# Patient Record
Sex: Female | Born: 1940
Health system: Southern US, Community
[De-identification: ages and names within clinical notes are randomized; demographics above are authoritative.]

## PROBLEM LIST (undated history)

## (undated) DIAGNOSIS — M1990 Unspecified osteoarthritis, unspecified site: Secondary | ICD-10-CM

## (undated) DIAGNOSIS — E119 Type 2 diabetes mellitus without complications: Secondary | ICD-10-CM

## (undated) DIAGNOSIS — K859 Acute pancreatitis without necrosis or infection, unspecified: Secondary | ICD-10-CM

## (undated) DIAGNOSIS — E785 Hyperlipidemia, unspecified: Secondary | ICD-10-CM

## (undated) DIAGNOSIS — F0390 Unspecified dementia without behavioral disturbance: Secondary | ICD-10-CM

## (undated) DIAGNOSIS — H5440 Blindness, one eye, unspecified eye: Secondary | ICD-10-CM

## (undated) DIAGNOSIS — D649 Anemia, unspecified: Secondary | ICD-10-CM

## (undated) DIAGNOSIS — I10 Essential (primary) hypertension: Secondary | ICD-10-CM

## (undated) DIAGNOSIS — K219 Gastro-esophageal reflux disease without esophagitis: Secondary | ICD-10-CM

## (undated) DIAGNOSIS — R6251 Failure to thrive (child): Secondary | ICD-10-CM

## (undated) HISTORY — DX: Failure to thrive (child): R62.51

## (undated) HISTORY — DX: Unspecified osteoarthritis, unspecified site: M19.90

## (undated) HISTORY — DX: Gastro-esophageal reflux disease without esophagitis: K21.9

## (undated) HISTORY — DX: Type 2 diabetes mellitus without complications: E11.9

## (undated) HISTORY — DX: Anemia, unspecified: D64.9

## (undated) HISTORY — DX: Acute pancreatitis without necrosis or infection, unspecified: K85.90

## (undated) HISTORY — PX: INDUCED ABORTION: SHX677

## (undated) HISTORY — DX: Essential (primary) hypertension: I10

## (undated) HISTORY — DX: Blindness, one eye, unspecified eye: H54.40

## (undated) HISTORY — PX: HERNIA REPAIR: SHX51

## (undated) HISTORY — DX: Unspecified dementia, unspecified severity, without behavioral disturbance, psychotic disturbance, mood disturbance, and anxiety: F03.90

## (undated) HISTORY — DX: Hyperlipidemia, unspecified: E78.5

---

## 2005-01-21 ENCOUNTER — Ambulatory Visit: Payer: Self-pay | Admitting: Family Medicine

## 2005-04-05 ENCOUNTER — Ambulatory Visit: Payer: Self-pay | Admitting: Family Medicine

## 2005-05-17 ENCOUNTER — Ambulatory Visit: Payer: Self-pay | Admitting: Family Medicine

## 2005-08-01 ENCOUNTER — Ambulatory Visit: Payer: Self-pay | Admitting: Family Medicine

## 2005-11-27 ENCOUNTER — Ambulatory Visit: Payer: Self-pay | Admitting: Family Medicine

## 2005-11-29 ENCOUNTER — Ambulatory Visit: Payer: Self-pay | Admitting: Family Medicine

## 2005-12-06 ENCOUNTER — Ambulatory Visit: Payer: Self-pay | Admitting: Family Medicine

## 2005-12-13 ENCOUNTER — Ambulatory Visit: Payer: Self-pay | Admitting: Family Medicine

## 2006-01-17 ENCOUNTER — Ambulatory Visit: Payer: Self-pay | Admitting: Family Medicine

## 2006-09-05 ENCOUNTER — Ambulatory Visit: Payer: Self-pay | Admitting: Family Medicine

## 2006-09-08 ENCOUNTER — Ambulatory Visit: Payer: Self-pay | Admitting: Family Medicine

## 2007-01-15 ENCOUNTER — Ambulatory Visit: Payer: Self-pay | Admitting: Family Medicine

## 2007-01-30 ENCOUNTER — Ambulatory Visit: Payer: Self-pay | Admitting: Vascular Surgery

## 2007-05-01 ENCOUNTER — Ambulatory Visit: Payer: Self-pay | Admitting: Vascular Surgery

## 2010-12-25 NOTE — Assessment & Plan Note (Signed)
OFFICE VISIT   Claudia [name], Claudia [name]  DOB:  23-Sep-1940                                       05/01/2007  ZOXWR#:[id number]   Patient presents today for continued followup of her left leg venous  varicosities.  She has worn compression garments for three months and  reports that she still has pain overlying the varicosities.  She  underwent a venous duplex in our office today, and I have reviewed this  with her as well.  She does have reflux at the left saphenofemoral  junction over a short segment but then begins having normal caliber of  her saphenous vein through her thigh.  She does have a large group of  superficial tributary varicosities over her left posterior thigh  extending into her popliteal space and laterally on her calves.  These  do not appear associated with her saphenous vein.  I discussed options  with patient through her granddaughter, who is translating.  I do not  see any indication for laser ablation of her saphenous vein.  I would  recommend stab phlebectomy of these multiple painful tributary  varicosities.  She understands this it not life or limb threatening, but  she is having pronounced pain with prolonged standing with these and has  failed conservative treatment.  We will schedule this at her  convenience.   Larina Earthly, M.D.  Electronically Signed   TFE/MEDQ  D:  05/01/2007  T:  05/04/2007  Job:  [id number]

## 2010-12-25 NOTE — Procedures (Signed)
LOWER EXTREMITY VENOUS REFLUX EXAM   INDICATION:  Left leg varicose veins and pain.   EXAM:  Using color-flow imaging and pulse Doppler spectral analysis, the  left common femoral, superficial femoral, popliteal, posterior tibial,  greater and lesser saphenous veins are evaluated.  There is no evidence  suggesting deep venous insufficiency in the left lower extremity.   The left saphenofemoral junction is not competent.  The left GSV is  competent with the caliber as described below.   The left proximal short saphenous vein demonstrates competency.   GSV Diameter (used if found to be incompetent only)                                            Right    Left  Proximal Greater Saphenous Vein           cm       0.39 cm  Proximal-to-mid-thigh                     cm       0.39 cm  Mid thigh                                 cm       0.26 cm  Mid-distal thigh                          cm       0.26 cm  Distal thigh                              cm       0.25 cm  Knee                                      cm       0.24 cm   IMPRESSION:  1. No evidence of reflux noted in the left greater saphenous vein.  2. The left greater saphenous vein is not aneurysmal.  3. The left greater saphenous vein is not tortuous.  4. The deep venous system is competent.  5. The left lesser saphenous vein is competent.  6. No evidence of deep venous thrombosis noted in the left leg.  7. Cluster of varicosities noted in the groin region.   ___________________________________________  Larina Earthly, M.D.   MG/MEDQ  D:  05/01/2007  T:  05/02/2007  Job:  [id number]

## 2010-12-25 NOTE — Consult Note (Signed)
VASCULAR SURGERY CONSULTATION   Claudia [name], Claudia [name]  DOB:  [birth year]                                       01/30/2007  ZOXWR#:[id number]   Claudia [name] presents today for evaluation of left leg venous disease.  She is a pleasant 70 year old Hispanic female with progressive pain and  swelling over her left posterior calf; and the posterior thigh.  She  reports an aching sensation throughout this area; and specifically over  the veins themselves with prolonged standing.  She reports that this has  been present for many years, but has been progressive.  She does have  evidence of significant venous hypertension over these areas.  She does  not have any history of arterial insufficiency; and does not had any  history of deep venous thrombosis or bleeding.  She does have swelling  at the end of the day over her left versus her right leg.   PAST HISTORY:  Significant for elevated cholesterol, elevated blood  pressure.  She does not have a history of cardiac disease.  She is not a  diabetic.   SOCIAL HISTORY:  Significant for being widowed with four children.  She  does not smoke or drink alcohol.  She does have some arthritic joint  pain in her left knee.   PHYSICAL EXAMINATION:  A well-developed, well-nourished female in no  acute distress.  She speaks very little [place], but fortunately her  daughter is fluent in [place] and [place] and is here to translate.  She  has some reticular varicosities at the medial thigh near her groin.  She  does have the marked large varicose veins in her lateral posterior thigh  extending down onto her calf.  She does have tenderness over these  areas.  She has 2+ dorsalis pedis pulses bilaterally.  She underwent a  hand-held duplex of the knee showing reflux in these veins over her  lateral calf.  I discussed the options with Claudia [name].  I have  recommended that we proceed with 20-54mm  compression garment on her  left leg to  determine if this will adequately treat her pain.  If this  fails, she, in all likelihood, would be a great candidate for laser  ablation of her small saphenous vein, and stab phlebectomy.  We will see  her in three months' to determine the success of her graduated  compression.  Also she will continue elevation and nonnarcotic pain meds  as well.  We will have a formal duplex at that time as well.   Larina Earthly, M.D.  Electronically Signed  TFE/MEDQ  D:  01/30/2007  T:  02/02/2007  Job:  [id number]   cc:   Delaney Meigs, M.D.

## 2011-08-14 DIAGNOSIS — E785 Hyperlipidemia, unspecified: Secondary | ICD-10-CM | POA: Diagnosis not present

## 2011-08-14 DIAGNOSIS — E119 Type 2 diabetes mellitus without complications: Secondary | ICD-10-CM | POA: Diagnosis not present

## 2011-08-14 DIAGNOSIS — M129 Arthropathy, unspecified: Secondary | ICD-10-CM | POA: Diagnosis not present

## 2011-08-14 DIAGNOSIS — E559 Vitamin D deficiency, unspecified: Secondary | ICD-10-CM | POA: Diagnosis not present

## 2011-08-14 DIAGNOSIS — I10 Essential (primary) hypertension: Secondary | ICD-10-CM | POA: Diagnosis not present

## 2011-11-13 DIAGNOSIS — E119 Type 2 diabetes mellitus without complications: Secondary | ICD-10-CM | POA: Diagnosis not present

## 2011-11-13 DIAGNOSIS — D649 Anemia, unspecified: Secondary | ICD-10-CM | POA: Diagnosis not present

## 2011-11-13 DIAGNOSIS — R5383 Other fatigue: Secondary | ICD-10-CM | POA: Diagnosis not present

## 2011-11-13 DIAGNOSIS — D539 Nutritional anemia, unspecified: Secondary | ICD-10-CM | POA: Diagnosis not present

## 2012-02-14 DIAGNOSIS — I10 Essential (primary) hypertension: Secondary | ICD-10-CM | POA: Diagnosis not present

## 2012-02-14 DIAGNOSIS — E559 Vitamin D deficiency, unspecified: Secondary | ICD-10-CM | POA: Diagnosis not present

## 2012-02-14 DIAGNOSIS — E785 Hyperlipidemia, unspecified: Secondary | ICD-10-CM | POA: Diagnosis not present

## 2012-02-14 DIAGNOSIS — D509 Iron deficiency anemia, unspecified: Secondary | ICD-10-CM | POA: Diagnosis not present

## 2012-02-14 DIAGNOSIS — E119 Type 2 diabetes mellitus without complications: Secondary | ICD-10-CM | POA: Diagnosis not present

## 2012-02-19 DIAGNOSIS — D649 Anemia, unspecified: Secondary | ICD-10-CM | POA: Diagnosis not present

## 2012-04-08 DIAGNOSIS — K5900 Constipation, unspecified: Secondary | ICD-10-CM | POA: Diagnosis not present

## 2012-04-08 DIAGNOSIS — D500 Iron deficiency anemia secondary to blood loss (chronic): Secondary | ICD-10-CM | POA: Diagnosis not present

## 2012-04-15 DIAGNOSIS — K2940 Chronic atrophic gastritis without bleeding: Secondary | ICD-10-CM | POA: Diagnosis not present

## 2012-04-15 DIAGNOSIS — D509 Iron deficiency anemia, unspecified: Secondary | ICD-10-CM | POA: Diagnosis not present

## 2012-04-15 DIAGNOSIS — K259 Gastric ulcer, unspecified as acute or chronic, without hemorrhage or perforation: Secondary | ICD-10-CM | POA: Diagnosis not present

## 2012-04-15 DIAGNOSIS — D500 Iron deficiency anemia secondary to blood loss (chronic): Secondary | ICD-10-CM | POA: Diagnosis not present

## 2012-04-15 DIAGNOSIS — K649 Unspecified hemorrhoids: Secondary | ICD-10-CM | POA: Diagnosis not present

## 2012-04-15 DIAGNOSIS — K449 Diaphragmatic hernia without obstruction or gangrene: Secondary | ICD-10-CM | POA: Diagnosis not present

## 2012-04-15 DIAGNOSIS — A048 Other specified bacterial intestinal infections: Secondary | ICD-10-CM | POA: Diagnosis not present

## 2012-05-18 DIAGNOSIS — D500 Iron deficiency anemia secondary to blood loss (chronic): Secondary | ICD-10-CM | POA: Diagnosis not present

## 2012-05-18 DIAGNOSIS — K259 Gastric ulcer, unspecified as acute or chronic, without hemorrhage or perforation: Secondary | ICD-10-CM | POA: Diagnosis not present

## 2012-10-08 DIAGNOSIS — I10 Essential (primary) hypertension: Secondary | ICD-10-CM | POA: Diagnosis not present

## 2012-10-08 DIAGNOSIS — E119 Type 2 diabetes mellitus without complications: Secondary | ICD-10-CM | POA: Diagnosis not present

## 2012-10-08 DIAGNOSIS — E785 Hyperlipidemia, unspecified: Secondary | ICD-10-CM | POA: Diagnosis not present

## 2012-10-27 ENCOUNTER — Telehealth: Payer: Self-pay | Admitting: Family Medicine

## 2012-10-27 MED ORDER — OLMESARTAN MEDOXOMIL 40 MG PO TABS: 40.0000 mg | ORAL_TABLET | Freq: Every day | ORAL | Status: DC

## 2012-10-27 MED ORDER — METFORMIN HCL 1000 MG PO TABS: 1000.0000 mg | ORAL_TABLET | Freq: Two times a day (BID) | ORAL | Status: DC

## 2012-10-27 NOTE — Telephone Encounter (Signed)
Refilled meds

## 2012-11-04 ENCOUNTER — Telehealth: Payer: Self-pay | Admitting: Physician Assistant

## 2012-11-04 DIAGNOSIS — E119 Type 2 diabetes mellitus without complications: Secondary | ICD-10-CM

## 2012-11-04 DIAGNOSIS — M1990 Unspecified osteoarthritis, unspecified site: Secondary | ICD-10-CM

## 2012-11-04 DIAGNOSIS — I10 Essential (primary) hypertension: Secondary | ICD-10-CM

## 2012-11-05 MED ORDER — METFORMIN HCL 1000 MG PO TABS: 1000.0000 mg | ORAL_TABLET | Freq: Two times a day (BID) | ORAL | Status: DC

## 2012-11-05 MED ORDER — TRAMADOL HCL 50 MG PO TABS: 50.0000 mg | ORAL_TABLET | Freq: Four times a day (QID) | ORAL | Status: DC | PRN

## 2012-11-05 MED ORDER — OLMESARTAN MEDOXOMIL 40 MG PO TABS: 40.0000 mg | ORAL_TABLET | Freq: Every day | ORAL | Status: DC

## 2012-11-05 MED ORDER — NAPROXEN 500 MG PO TBEC: 500.0000 mg | DELAYED_RELEASE_TABLET | Freq: Two times a day (BID) | ORAL | Status: DC

## 2012-11-05 NOTE — Telephone Encounter (Signed)
Medication refilled per protocol. 

## 2012-12-12 ENCOUNTER — Other Ambulatory Visit: Payer: Self-pay | Admitting: Family Medicine

## 2012-12-28 ENCOUNTER — Telehealth: Payer: Self-pay | Admitting: Family Medicine

## 2012-12-28 ENCOUNTER — Telehealth: Payer: Self-pay | Admitting: *Deleted

## 2012-12-28 DIAGNOSIS — I10 Essential (primary) hypertension: Secondary | ICD-10-CM

## 2012-12-28 NOTE — Telephone Encounter (Signed)
This was refilled 3/25 + 5  Pharmacy called.  No need for another refill at this time

## 2012-12-29 MED ORDER — OLMESARTAN MEDOXOMIL 40 MG PO TABS: 40.0000 mg | ORAL_TABLET | Freq: Every day | ORAL | Status: DC

## 2012-12-29 NOTE — Telephone Encounter (Signed)
Medication refilled per protocol. 

## 2012-12-30 ENCOUNTER — Telehealth: Payer: Self-pay | Admitting: Family Medicine

## 2012-12-30 NOTE — Telephone Encounter (Signed)
RX was filled on 12/29/12

## 2013-01-14 ENCOUNTER — Other Ambulatory Visit: Payer: Self-pay | Admitting: Family Medicine

## 2013-01-14 NOTE — Telephone Encounter (Signed)
Medication refilled per protocol. 

## 2013-01-30 ENCOUNTER — Other Ambulatory Visit: Payer: Self-pay | Admitting: Family Medicine

## 2013-02-01 NOTE — Telephone Encounter (Signed)
?   OK to Refill  

## 2013-02-01 NOTE — Telephone Encounter (Signed)
Ok to refill but needs ov  

## 2013-03-10 ENCOUNTER — Other Ambulatory Visit: Payer: Self-pay | Admitting: Physician Assistant

## 2013-03-10 ENCOUNTER — Encounter: Payer: Self-pay | Admitting: Family Medicine

## 2013-03-10 ENCOUNTER — Other Ambulatory Visit: Payer: Self-pay | Admitting: Family Medicine

## 2013-03-10 NOTE — Telephone Encounter (Signed)
?   OK to Refill  

## 2013-03-10 NOTE — Telephone Encounter (Signed)
Medication refill for one time only.  Patient needs to be seen.  Letter sent for patient to call and schedule 

## 2013-03-11 NOTE — Telephone Encounter (Signed)
ntbs before next refill.  OK to fill this time.

## 2013-03-11 NOTE — Telephone Encounter (Signed)
Med refilled tried calling pt and number has been disconnected

## 2013-03-14 ENCOUNTER — Other Ambulatory Visit: Payer: Self-pay | Admitting: Physician Assistant

## 2013-03-15 ENCOUNTER — Encounter: Payer: Self-pay | Admitting: Family Medicine

## 2013-03-15 NOTE — Telephone Encounter (Signed)
Pt NTBS  letter sent

## 2013-03-16 ENCOUNTER — Other Ambulatory Visit: Payer: Self-pay | Admitting: Physician Assistant

## 2013-03-16 NOTE — Telephone Encounter (Signed)
Medication refilled per protocol.Patient needs to be seen before any further refills 

## 2013-03-27 ENCOUNTER — Other Ambulatory Visit: Payer: Self-pay | Admitting: Family Medicine

## 2013-03-29 ENCOUNTER — Other Ambulatory Visit: Payer: Self-pay | Admitting: Family Medicine

## 2013-03-29 ENCOUNTER — Ambulatory Visit: Payer: Self-pay | Admitting: Family Medicine

## 2013-03-29 ENCOUNTER — Other Ambulatory Visit: Payer: Self-pay | Admitting: Physician Assistant

## 2013-03-29 NOTE — Telephone Encounter (Signed)
Medication refilled per protocol. 

## 2013-03-30 ENCOUNTER — Other Ambulatory Visit: Payer: Self-pay | Admitting: Physician Assistant

## 2013-03-30 NOTE — Telephone Encounter (Signed)
Refill denied.  Pt has been sent two letters to make appt.

## 2013-03-31 ENCOUNTER — Telehealth: Payer: Self-pay | Admitting: Family Medicine

## 2013-03-31 DIAGNOSIS — I10 Essential (primary) hypertension: Secondary | ICD-10-CM

## 2013-03-31 MED ORDER — OLMESARTAN MEDOXOMIL 40 MG PO TABS: 40.0000 mg | ORAL_TABLET | Freq: Every day | ORAL | Status: DC

## 2013-03-31 NOTE — Telephone Encounter (Signed)
Rx Refilled  

## 2013-03-31 NOTE — Telephone Encounter (Signed)
Benicar 40 mg tab 1 QD #30

## 2013-04-02 ENCOUNTER — Ambulatory Visit (INDEPENDENT_AMBULATORY_CARE_PROVIDER_SITE_OTHER): Payer: Medicare Other | Admitting: Family Medicine

## 2013-04-02 VITALS — BP 150/98 | HR 86 | Temp 98.7°F | Resp 16 | Wt 179.0 lb

## 2013-04-02 DIAGNOSIS — I10 Essential (primary) hypertension: Secondary | ICD-10-CM | POA: Diagnosis not present

## 2013-04-02 DIAGNOSIS — M5441 Lumbago with sciatica, right side: Secondary | ICD-10-CM

## 2013-04-02 DIAGNOSIS — M5430 Sciatica, unspecified side: Secondary | ICD-10-CM

## 2013-04-02 DIAGNOSIS — E785 Hyperlipidemia, unspecified: Secondary | ICD-10-CM

## 2013-04-02 DIAGNOSIS — E119 Type 2 diabetes mellitus without complications: Secondary | ICD-10-CM | POA: Diagnosis not present

## 2013-04-02 MED ORDER — METFORMIN HCL 500 MG PO TABS: 500.0000 mg | ORAL_TABLET | Freq: Two times a day (BID) | ORAL | Status: DC

## 2013-04-02 MED ORDER — HYDROCHLOROTHIAZIDE 25 MG PO TABS: 25.0000 mg | ORAL_TABLET | Freq: Every day | ORAL | Status: DC

## 2013-04-05 ENCOUNTER — Encounter: Payer: Self-pay | Admitting: Family Medicine

## 2013-04-05 ENCOUNTER — Telehealth: Payer: Self-pay | Admitting: Family Medicine

## 2013-04-05 ENCOUNTER — Other Ambulatory Visit: Payer: Medicare Other

## 2013-04-05 DIAGNOSIS — K219 Gastro-esophageal reflux disease without esophagitis: Secondary | ICD-10-CM | POA: Insufficient documentation

## 2013-04-05 DIAGNOSIS — I10 Essential (primary) hypertension: Secondary | ICD-10-CM | POA: Insufficient documentation

## 2013-04-05 DIAGNOSIS — E119 Type 2 diabetes mellitus without complications: Secondary | ICD-10-CM

## 2013-04-05 DIAGNOSIS — E785 Hyperlipidemia, unspecified: Secondary | ICD-10-CM

## 2013-04-05 LAB — MICROALBUMIN, URINE: Microalb, Ur: 1.6 mg/dL (ref 0.00–1.89)

## 2013-04-05 LAB — COMPLETE METABOLIC PANEL WITH GFR
BUN: 30 mg/dL — ABNORMAL HIGH (ref 6–23)
CO2: 24 mEq/L (ref 19–32)
Calcium: 9.5 mg/dL (ref 8.4–10.5)
Chloride: 109 mEq/L (ref 96–112)
Creat: 0.96 mg/dL (ref 0.50–1.10)
GFR, Est African American: 68 mL/min
GFR, Est Non African American: 59 mL/min — ABNORMAL LOW
Glucose, Bld: 90 mg/dL (ref 70–99)

## 2013-04-05 LAB — HEMOGLOBIN A1C
Hgb A1c MFr Bld: 5.9 % — ABNORMAL HIGH (ref <5.7)
Mean Plasma Glucose: 123 mg/dL — ABNORMAL HIGH (ref <117)

## 2013-04-05 LAB — LIPID PANEL: HDL: 46 mg/dL (ref >39)

## 2013-04-05 NOTE — Progress Notes (Signed)
Subjective:    Patient ID: Claudia [name], female    DOB: 1941/01/23, 72 y.o.   MRN: [id number]  HPI Patient is here today for followup of multiple medical problems.  Is diabetes mellitus type 2. She is going on metformin 1000 mg by mouth twice a day and Actos 15 mg by mouth daily. Her fasting blood sugars range 70-90. She has occasional episodes of hypoglycemia.  She denies any neuropathy in her feet. She denies any polyuria, polydipsia, or blurred vision.  She also has hypertension. She is currently on Benicar 40 mg by mouth daily. She denies any chest pain, shortness of breath, dyspnea on exertion. Her blood pressure today is 150/98. She also has hyperlipidemia. She is on Crestor 10 mg by mouth daily.  She denies any myalgia or right upper quadrant pain.  Chest complains of low back pain on the right flank since a fall several months ago. She complains of pain that radiates into her right hip down her right leg and into her right thigh. She denies any leg weakness. She denies any numbness in the leg. She has tried naproxen with minimal relief.  Past Medical History  Diagnosis Date  . Hypertension   . Hyperlipidemia   . Diabetes mellitus without complication   . Anemia   . GERD (gastroesophageal reflux disease)     chronic gastritis  . Arthritis    Current Outpatient Prescriptions on File Prior to Visit  Medication Sig Dispense Refill  . CRESTOR 10 MG tablet TAKE 1 TABLET BY MOUTH DAILY  30 tablet  0  . metFORMIN (GLUCOPHAGE) 1000 MG tablet TAKE 1 TABLET BY MOUTH TWICE A DAY  60 tablet  1  . NAPROXEN DR 500 MG EC tablet TAKE 1 TABLET BY MOUTH TWICE A DAY WITH A MEAL  60 tablet  0  . olmesartan (BENICAR) 40 MG tablet Take 1 tablet (40 mg total) by mouth daily.  30 tablet  2  . pioglitazone (ACTOS) 15 MG tablet TOME UNA TABLETA TODOS LOS DIAS  30 tablet  0  . traMADol (ULTRAM) 50 MG tablet TAKE 1 TABLET BY MOUTH EVERY 6 HOURS AS NEEDED FOR PAIN  120 tablet  0   No current  facility-administered medications on file prior to visit.   No Known Allergies History   Social History  . Marital Status: Single    Spouse Name: N/A    Number of Children: N/A  . Years of Education: N/A   Occupational History  . Not on file.   Social History Main Topics  . Smoking status: Never Smoker   . Smokeless tobacco: Never Used  . Alcohol Use: No  . Drug Use: No  . Sexual Activity: Not Currently   Other Topics Concern  . Not on file   Social History Narrative  . No narrative on file      Review of Systems  All other systems reviewed and are negative.       Objective:   Physical Exam  Vitals reviewed. Constitutional: She is oriented to person, place, and time. She appears well-developed and well-nourished.  HENT:  Head: Normocephalic and atraumatic.  Right Ear: External ear normal.  Left Ear: External ear normal.  Nose: Nose normal.  Mouth/Throat: Oropharynx is clear and moist. No oropharyngeal exudate.  Eyes: Conjunctivae and EOM are normal.  Neck: Neck supple. No JVD present. No thyromegaly present.  Cardiovascular: Normal rate, regular rhythm, normal heart sounds and intact distal pulses.  Exam reveals no gallop and  no friction rub.   No murmur heard. Pulmonary/Chest: Effort normal and breath sounds normal. No respiratory distress. She has no wheezes. She has no rales. She exhibits no tenderness.  Abdominal: Soft. Bowel sounds are normal. She exhibits no distension and no mass. There is no tenderness. There is no rebound and no guarding.  Musculoskeletal:       Lumbar back: She exhibits tenderness and pain. She exhibits normal range of motion, no bony tenderness, no swelling, no edema and no spasm.  Lymphadenopathy:    She has no cervical adenopathy.  Neurological: She is alert and oriented to person, place, and time. She has normal reflexes. She displays normal reflexes. No cranial nerve deficit. She exhibits normal muscle tone. Coordination normal.   Skin: No rash noted. No erythema. No pallor.   she has a positive right straight leg raise.        Assessment & Plan:  1. Low back pain on right side with sciatica I suspect herniated nucleus pulposus after the fall with resultant right sided sciatica.  Begin by obtaining a lumbar spine x-ray. Consider gabapentin. - DG Lumbar Spine Complete; Future  2. HTN (hypertension) Blood pressure is elevated, add hydrochlorothiazide 25 mg by mouth daily. - hydrochlorothiazide (HYDRODIURIL) 25 MG tablet; Take 1 tablet (25 mg total) by mouth daily.  Dispense: 90 tablet; Refill: 3  3. Type II or unspecified type diabetes mellitus without mention of complication, not stated as uncontrolled Goal A1c is less than 6.5. Based on her episodes of hypoglycemia, I recommended decreasing the metformin to 500 mg by mouth twice a day. Return fasting for a CMP, lipid panel, and urine microalbumin as well.  I recommended an annual eye exam as well as aspirin 81 mg by mouth daily. Diabetic foot exam is normal. - COMPLETE METABOLIC PANEL WITH GFR; Future - Hemoglobin A1c; Future - Lipid panel; Future - Microalbumin, urine; Future  4. HLD (hyperlipidemia) Check fasting lipid panel. LDL is less than 100. - Lipid panel; Future

## 2013-04-05 NOTE — Telephone Encounter (Signed)
Try holding the pill and see if symptoms improve, but it shouldn't do that.  Are her sugars low?

## 2013-04-06 NOTE — Telephone Encounter (Signed)
Spoke to daughter.  States sugars have been running in the 80's in the morning.  No more low sugars since seeing you.  Told daughter to have mother stop BP med per provider and see if symptoms resolve.  Call us in one week and let us know either way.

## 2013-04-08 ENCOUNTER — Ambulatory Visit
Admission: RE | Admit: 2013-04-08 | Discharge: 2013-04-08 | Disposition: A | Discharge status: Home / Self Care | Payer: Medicaid Other | Source: Ambulatory Visit | Attending: Family Medicine | Admitting: Family Medicine

## 2013-04-08 DIAGNOSIS — M5441 Lumbago with sciatica, right side: Secondary | ICD-10-CM

## 2013-04-08 DIAGNOSIS — M4310 Spondylolisthesis, site unspecified: Secondary | ICD-10-CM | POA: Diagnosis not present

## 2013-04-08 DIAGNOSIS — M47817 Spondylosis without myelopathy or radiculopathy, lumbosacral region: Secondary | ICD-10-CM | POA: Diagnosis not present

## 2013-04-09 ENCOUNTER — Other Ambulatory Visit: Payer: Self-pay | Admitting: Family Medicine

## 2013-04-09 DIAGNOSIS — M4310 Spondylolisthesis, site unspecified: Secondary | ICD-10-CM

## 2013-04-09 DIAGNOSIS — M5431 Sciatica, right side: Secondary | ICD-10-CM

## 2013-04-16 ENCOUNTER — Ambulatory Visit (HOSPITAL_COMMUNITY)
Admission: RE | Admit: 2013-04-16 | Discharge: 2013-04-16 | Disposition: A | Discharge status: Home / Self Care | Payer: Medicare Other | Source: Ambulatory Visit | Attending: Family Medicine | Admitting: Family Medicine

## 2013-04-16 DIAGNOSIS — M51379 Other intervertebral disc degeneration, lumbosacral region without mention of lumbar back pain or lower extremity pain: Secondary | ICD-10-CM | POA: Insufficient documentation

## 2013-04-16 DIAGNOSIS — M5126 Other intervertebral disc displacement, lumbar region: Secondary | ICD-10-CM | POA: Insufficient documentation

## 2013-04-16 DIAGNOSIS — M4310 Spondylolisthesis, site unspecified: Secondary | ICD-10-CM

## 2013-04-16 DIAGNOSIS — M5431 Sciatica, right side: Secondary | ICD-10-CM

## 2013-04-16 DIAGNOSIS — M545 Low back pain, unspecified: Secondary | ICD-10-CM | POA: Insufficient documentation

## 2013-04-16 DIAGNOSIS — M5137 Other intervertebral disc degeneration, lumbosacral region: Secondary | ICD-10-CM | POA: Diagnosis not present

## 2013-04-16 DIAGNOSIS — M47817 Spondylosis without myelopathy or radiculopathy, lumbosacral region: Secondary | ICD-10-CM | POA: Diagnosis not present

## 2013-04-19 ENCOUNTER — Other Ambulatory Visit: Payer: Self-pay | Admitting: Physician Assistant

## 2013-04-26 ENCOUNTER — Other Ambulatory Visit: Payer: Self-pay | Admitting: Family Medicine

## 2013-04-26 NOTE — Telephone Encounter (Signed)
ok 

## 2013-04-26 NOTE — Telephone Encounter (Signed)
?   OK to Refill  

## 2013-04-27 NOTE — Telephone Encounter (Signed)
Meds refilled.

## 2013-05-20 ENCOUNTER — Other Ambulatory Visit: Payer: Self-pay | Admitting: Physician Assistant

## 2013-05-20 NOTE — Telephone Encounter (Signed)
Medication refilled per protocol. 

## 2013-06-07 ENCOUNTER — Other Ambulatory Visit: Payer: Self-pay | Admitting: Family Medicine

## 2013-06-07 NOTE — Telephone Encounter (Signed)
Ok to refill 

## 2013-06-07 NOTE — Telephone Encounter (Signed)
RX called in .

## 2013-06-07 NOTE — Telephone Encounter (Signed)
ok 

## 2013-06-15 ENCOUNTER — Other Ambulatory Visit: Payer: Self-pay | Admitting: Family Medicine

## 2013-06-15 NOTE — Telephone Encounter (Signed)
Medication refilled per protocol. 

## 2013-07-27 ENCOUNTER — Other Ambulatory Visit: Payer: Self-pay | Admitting: Family Medicine

## 2013-07-28 NOTE — Telephone Encounter (Signed)
?   OK to Refill  

## 2013-07-29 NOTE — Telephone Encounter (Signed)
ok 

## 2013-08-02 ENCOUNTER — Other Ambulatory Visit: Payer: Self-pay | Admitting: Family Medicine

## 2013-08-02 NOTE — Telephone Encounter (Signed)
Medication refilled per protocol. 

## 2013-08-10 ENCOUNTER — Other Ambulatory Visit: Payer: Self-pay | Admitting: Family Medicine

## 2013-08-22 ENCOUNTER — Other Ambulatory Visit: Payer: Self-pay | Admitting: Family Medicine

## 2013-08-23 ENCOUNTER — Other Ambulatory Visit: Payer: Self-pay | Admitting: Family Medicine

## 2013-08-23 ENCOUNTER — Other Ambulatory Visit: Payer: Self-pay | Admitting: Physician Assistant

## 2013-08-24 ENCOUNTER — Other Ambulatory Visit: Payer: Self-pay | Admitting: Family Medicine

## 2013-08-24 ENCOUNTER — Other Ambulatory Visit: Payer: Self-pay | Admitting: Physician Assistant

## 2013-09-19 ENCOUNTER — Other Ambulatory Visit: Payer: Self-pay | Admitting: Family Medicine

## 2013-09-20 ENCOUNTER — Telehealth: Payer: Self-pay | Admitting: Family Medicine

## 2013-09-20 NOTE — Telephone Encounter (Signed)
Tramadol 50 mg  - ? OK to Refill

## 2013-09-21 MED ORDER — TRAMADOL HCL 50 MG PO TABS: ORAL_TABLET | ORAL | Status: DC

## 2013-09-21 NOTE — Telephone Encounter (Signed)
Rx Refilled  

## 2013-09-21 NOTE — Telephone Encounter (Signed)
ok 

## 2013-10-31 ENCOUNTER — Other Ambulatory Visit: Payer: Self-pay | Admitting: Family Medicine

## 2013-11-01 ENCOUNTER — Encounter: Payer: Self-pay | Admitting: Family Medicine

## 2013-11-01 NOTE — Telephone Encounter (Signed)
Medication refill for one time only.  Patient needs to be seen.  Letter sent for patient to call and schedule 

## 2013-11-06 ENCOUNTER — Other Ambulatory Visit: Payer: Self-pay | Admitting: Family Medicine

## 2013-11-08 ENCOUNTER — Other Ambulatory Visit: Payer: Self-pay | Admitting: Family Medicine

## 2013-11-08 MED ORDER — NAPROXEN 500 MG PO TBEC: DELAYED_RELEASE_TABLET | ORAL | Status: DC

## 2013-11-08 NOTE — Telephone Encounter (Signed)
Refill appropriate and filled per protocol. 

## 2013-11-08 NOTE — Telephone Encounter (Signed)
Rx Refilled  

## 2013-11-11 ENCOUNTER — Other Ambulatory Visit: Payer: Self-pay | Admitting: Family Medicine

## 2013-11-12 NOTE — Telephone Encounter (Signed)
Ok to refill??  Last office visit 04/02/2013.  Last refill 09/21/2013.

## 2013-11-15 NOTE — Telephone Encounter (Signed)
Medication called to pharmacy. 

## 2013-11-15 NOTE — Telephone Encounter (Signed)
ok 

## 2013-11-25 ENCOUNTER — Encounter: Payer: Self-pay | Admitting: Family Medicine

## 2013-11-25 ENCOUNTER — Ambulatory Visit (INDEPENDENT_AMBULATORY_CARE_PROVIDER_SITE_OTHER): Payer: Medicare Other | Admitting: Family Medicine

## 2013-11-25 VITALS — BP 136/82 | HR 84 | Temp 96.6°F | Resp 14 | Ht 63.0 in | Wt 186.0 lb

## 2013-11-25 DIAGNOSIS — Z9109 Other allergy status, other than to drugs and biological substances: Secondary | ICD-10-CM | POA: Diagnosis not present

## 2013-11-25 DIAGNOSIS — E785 Hyperlipidemia, unspecified: Secondary | ICD-10-CM | POA: Diagnosis not present

## 2013-11-25 DIAGNOSIS — I10 Essential (primary) hypertension: Secondary | ICD-10-CM | POA: Diagnosis not present

## 2013-11-25 DIAGNOSIS — E119 Type 2 diabetes mellitus without complications: Secondary | ICD-10-CM | POA: Diagnosis not present

## 2013-11-25 LAB — COMPLETE METABOLIC PANEL WITH GFR
ALT: 10 U/L (ref 0–35)
AST: 14 U/L (ref 0–37)
Albumin: 4.2 g/dL (ref 3.5–5.2)
Alkaline Phosphatase: 66 U/L (ref 39–117)
BUN: 26 mg/dL — ABNORMAL HIGH (ref 6–23)
CO2: 25 mEq/L (ref 19–32)
CREATININE: 0.84 mg/dL (ref 0.50–1.10)
Calcium: 9.9 mg/dL (ref 8.4–10.5)
Chloride: 106 mEq/L (ref 96–112)
GFR, EST AFRICAN AMERICAN: 79 mL/min
GFR, EST NON AFRICAN AMERICAN: 69 mL/min
Glucose, Bld: 97 mg/dL (ref 70–99)
Potassium: 5.1 mEq/L (ref 3.5–5.3)
Sodium: 140 mEq/L (ref 135–145)
Total Bilirubin: 0.4 mg/dL (ref 0.2–1.2)
Total Protein: 7.2 g/dL (ref 6.0–8.3)

## 2013-11-25 LAB — MICROALBUMIN, URINE: Microalb, Ur: 1.74 mg/dL (ref 0.00–1.89)

## 2013-11-25 LAB — LIPID PANEL
Cholesterol: 105 mg/dL (ref 0–200)
HDL: 49 mg/dL (ref >39)
LDL Cholesterol: 38 mg/dL (ref 0–99)
Total CHOL/HDL Ratio: 2.1 Ratio
Triglycerides: 89 mg/dL (ref <150)
VLDL: 18 mg/dL (ref 0–40)

## 2013-11-25 LAB — HEMOGLOBIN A1C
Hgb A1c MFr Bld: 6.1 % — ABNORMAL HIGH (ref <5.7)
MEAN PLASMA GLUCOSE: 128 mg/dL — AB (ref <117)

## 2013-11-25 MED ORDER — FLUTICASONE PROPIONATE 50 MCG/ACT NA SUSP: 2.0000 | Freq: Every day | NASAL | Status: DC

## 2013-11-25 MED ORDER — LORATADINE 10 MG PO TABS: 10.0000 mg | ORAL_TABLET | Freq: Every day | ORAL | Status: DC

## 2013-11-25 NOTE — Progress Notes (Signed)
Subjective:    Patient ID: Claudia [name], female    DOB: 22-Oct-1940, 73 y.o.   MRN: [id number]  HPI   Patient is here for followup of her type 2 diabetes mellitus, hypertension, hyperlipidemia. She is also complaining of rhinorrhea, nasal congestion, sinus congestion, postnasal drip, and coughing. She has not tried any medication. She has not seen an eye doctor since 2011. She is not taking a baby aspirin. She also complains of pain in her right foot on the second toe.  She reports that her fasting blood sugars are in the mid 60s. She denies any polyuria, polydipsia, or blurred vision. She denies any chest pain or shortness of breath or dyspnea on exertion. She denies any myalgias or upper quadrant pain. Past Medical History  Diagnosis Date  . Hypertension   . Hyperlipidemia   . Diabetes mellitus without complication   . Anemia   . GERD (gastroesophageal reflux disease)     chronic gastritis  . Arthritis    No past surgical history on file. Current Outpatient Prescriptions on File Prior to Visit  Medication Sig Dispense Refill  . BAYER CONTOUR TEST test strip TEST SUGAR ONCE A DAY  50 each  11  . BENICAR 40 MG tablet TOME UNA TABLETA POR VIA ORAL TODOS LOS DIAS  30 tablet  2  . CRESTOR 10 MG tablet TAKE 1 TABLET BY MOUTH DAILY  30 tablet  5  . hydrochlorothiazide (HYDRODIURIL) 25 MG tablet Take 1 tablet (25 mg total) by mouth daily.  90 tablet  3  . metFORMIN (GLUCOPHAGE) 1000 MG tablet TAKE 1 TABLET BY MOUTH TWICE A DAY  60 tablet  1  . naproxen (NAPROXEN DR) 500 MG EC tablet TAKE 1 TABLET BY MOUTH TWICE A DAY WITH A MEAL  60 tablet  1  . pioglitazone (ACTOS) 15 MG tablet TOME UNA TABLETA TODOS LOS DIAS  30 tablet  0  . traMADol (ULTRAM) 50 MG tablet TAKE 1 TABLET BY MOUTH EVERY 6 HOURS AS NEEDED FOR PAIN  120 tablet  0   No current facility-administered medications on file prior to visit.   No Known Allergies History   Social History  . Marital Status: Single    Spouse Name:  N/A    Number of Children: N/A  . Years of Education: N/A   Occupational History  . Not on file.   Social History Main Topics  . Smoking status: Never Smoker   . Smokeless tobacco: Never Used  . Alcohol Use: No  . Drug Use: No  . Sexual Activity: Not Currently   Other Topics Concern  . Not on file   Social History Narrative  . No narrative on file     Review of Systems     Objective:   Physical Exam  Vitals reviewed. Constitutional: She is oriented to person, place, and time. She appears well-developed and well-nourished.  HENT:  Nose: Mucosal edema and rhinorrhea present.  Neck: Neck supple. No JVD present. No thyromegaly present.  Cardiovascular: Normal rate, regular rhythm, normal heart sounds and intact distal pulses.  Exam reveals no gallop and no friction rub.   No murmur heard. Pulmonary/Chest: Effort normal and breath sounds normal. No respiratory distress. She has no wheezes. She has no rales. She exhibits no tenderness.  Abdominal: Soft. Bowel sounds are normal. She exhibits no distension and no mass. There is no tenderness. There is no rebound and no guarding.  Musculoskeletal: She exhibits no edema.  Neurological: She is  alert and oriented to person, place, and time. She has normal reflexes. She displays normal reflexes. No cranial nerve deficit. She exhibits normal muscle tone. Coordination normal.          Assessment & Plan:  1. Environmental allergies Patient on Claritin 10 mg by mouth daily and Flonase 2 sprays each nostril daily for seasonal allergies. - loratadine (CLARITIN) 10 MG tablet; Take 1 tablet (10 mg total) by mouth daily.  Dispense: 30 tablet; Refill: 1 - fluticasone (FLONASE) 50 MCG/ACT nasal spray; Place 2 sprays into both nostrils daily.  Dispense: 16 g; Refill: 6  2. Type II or unspecified type diabetes mellitus without mention of complication, not stated as uncontrolled Check a hemoglobin A1c as well as a urine microalbumin. I will  have the patient discontinue Actos due to the hypoglycemia. Consult an ophthalmologist and eye exam. Recommended aspirin 81 mg by mouth daily. I will consult a podiatrist for the preulcerative callus on the tip of her second toe - COMPLETE METABOLIC PANEL WITH GFR - Lipid panel - Hemoglobin A1c - Microalbumin, urine - Ambulatory referral to Podiatry - Ambulatory referral to Ophthalmology  3. HTN (hypertension) Blood pressures well controlled. Continue current medications at the present dosages. 4. HLD (hyperlipidemia) Check fasting lipid panel. LDL is less than 100

## 2013-11-26 ENCOUNTER — Encounter: Payer: Self-pay | Admitting: Family Medicine

## 2013-12-28 DIAGNOSIS — H2510 Age-related nuclear cataract, unspecified eye: Secondary | ICD-10-CM | POA: Diagnosis not present

## 2013-12-28 DIAGNOSIS — H4720 Unspecified optic atrophy: Secondary | ICD-10-CM | POA: Diagnosis not present

## 2013-12-28 DIAGNOSIS — E119 Type 2 diabetes mellitus without complications: Secondary | ICD-10-CM | POA: Diagnosis not present

## 2013-12-28 DIAGNOSIS — H2700 Aphakia, unspecified eye: Secondary | ICD-10-CM | POA: Diagnosis not present

## 2013-12-30 ENCOUNTER — Encounter: Payer: Self-pay | Admitting: *Deleted

## 2014-01-05 ENCOUNTER — Telehealth: Payer: Self-pay | Admitting: Family Medicine

## 2014-01-05 NOTE — Telephone Encounter (Signed)
Requesting refill on Tramadol - ? OK to Refill - Last refill 11/15/2013 LOV 03/25/13

## 2014-01-06 MED ORDER — TRAMADOL HCL 50 MG PO TABS: ORAL_TABLET | ORAL | Status: DC

## 2014-01-06 NOTE — Telephone Encounter (Signed)
ok 

## 2014-01-06 NOTE — Telephone Encounter (Signed)
Med called out to pharm 

## 2014-01-10 ENCOUNTER — Other Ambulatory Visit: Payer: Self-pay | Admitting: Family Medicine

## 2014-01-10 NOTE — Telephone Encounter (Signed)
Medication refilled per protocol. 

## 2014-01-25 ENCOUNTER — Other Ambulatory Visit: Payer: Self-pay | Admitting: Family Medicine

## 2014-01-25 MED ORDER — NAPROXEN 500 MG PO TBEC: DELAYED_RELEASE_TABLET | ORAL | Status: DC

## 2014-01-25 NOTE — Telephone Encounter (Signed)
Rx Refilled  

## 2014-02-09 ENCOUNTER — Encounter: Payer: Self-pay | Admitting: Internal Medicine

## 2014-02-09 NOTE — Progress Notes (Signed)
This encounter was created in error - please disregard.

## 2014-03-01 ENCOUNTER — Telehealth: Payer: Self-pay | Admitting: Family Medicine

## 2014-03-01 MED ORDER — TRAMADOL HCL 50 MG PO TABS: ORAL_TABLET | ORAL | Status: DC

## 2014-03-01 NOTE — Telephone Encounter (Signed)
ok 

## 2014-03-01 NOTE — Telephone Encounter (Signed)
Requesting refill on Tramadol 50mg  q 6 hrs prn #120 - ? OK to Refill - last refill 01/06/14

## 2014-03-01 NOTE — Telephone Encounter (Signed)
Med called to pharm 

## 2014-03-04 ENCOUNTER — Telehealth: Payer: Self-pay | Admitting: Family Medicine

## 2014-03-04 NOTE — Telephone Encounter (Signed)
Tramadol was refilled on 7/21 and this was confirmed with pharmacy

## 2014-04-05 ENCOUNTER — Telehealth: Payer: Self-pay | Admitting: *Deleted

## 2014-04-05 MED ORDER — TRAMADOL HCL 50 MG PO TABS: ORAL_TABLET | ORAL | Status: DC

## 2014-04-05 NOTE — Telephone Encounter (Signed)
ok 

## 2014-04-05 NOTE — Telephone Encounter (Signed)
Medication called to pharmacy. 

## 2014-04-05 NOTE — Telephone Encounter (Signed)
Received fax requesting refill on Tramadol .   Ok to refill??  Last office visit 11/25/2013.  Last refill 03/01/2014.

## 2014-04-27 ENCOUNTER — Other Ambulatory Visit: Payer: Self-pay | Admitting: Physician Assistant

## 2014-05-19 ENCOUNTER — Other Ambulatory Visit: Payer: Self-pay | Admitting: Family Medicine

## 2014-05-26 ENCOUNTER — Other Ambulatory Visit: Payer: Self-pay | Admitting: Family Medicine

## 2014-05-30 ENCOUNTER — Telehealth: Payer: Self-pay | Admitting: Family Medicine

## 2014-05-30 MED ORDER — TRAMADOL HCL 50 MG PO TABS: ORAL_TABLET | ORAL | Status: DC

## 2014-05-30 NOTE — Telephone Encounter (Signed)
ok 

## 2014-05-30 NOTE — Telephone Encounter (Signed)
Med called to pharm 

## 2014-05-30 NOTE — Telephone Encounter (Signed)
Requesting a refill on Tramadol 50mg  1 tab po q 6 hours prn - Last refill 04/05/2014 - ? OK to Refill

## 2014-06-08 IMAGING — CR DG LUMBAR SPINE COMPLETE 4+V
6 series · 6 of 6 positions shown · non-contrast
Comparison: None.

CLINICAL DATA: Low back pain and right leg pain.  Fall 3 months
ago.

LUMBAR SPINE - COMPLETE 4+ VIEW

[t l-spine a.p.]
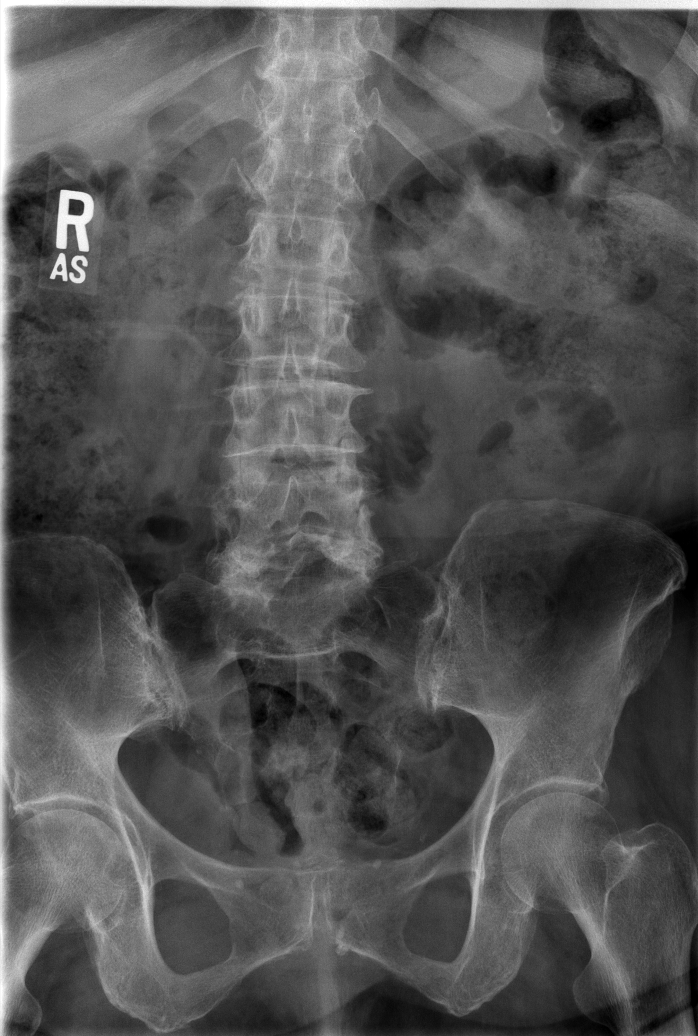

[t l-spine oblique exposure (1 of 2)]
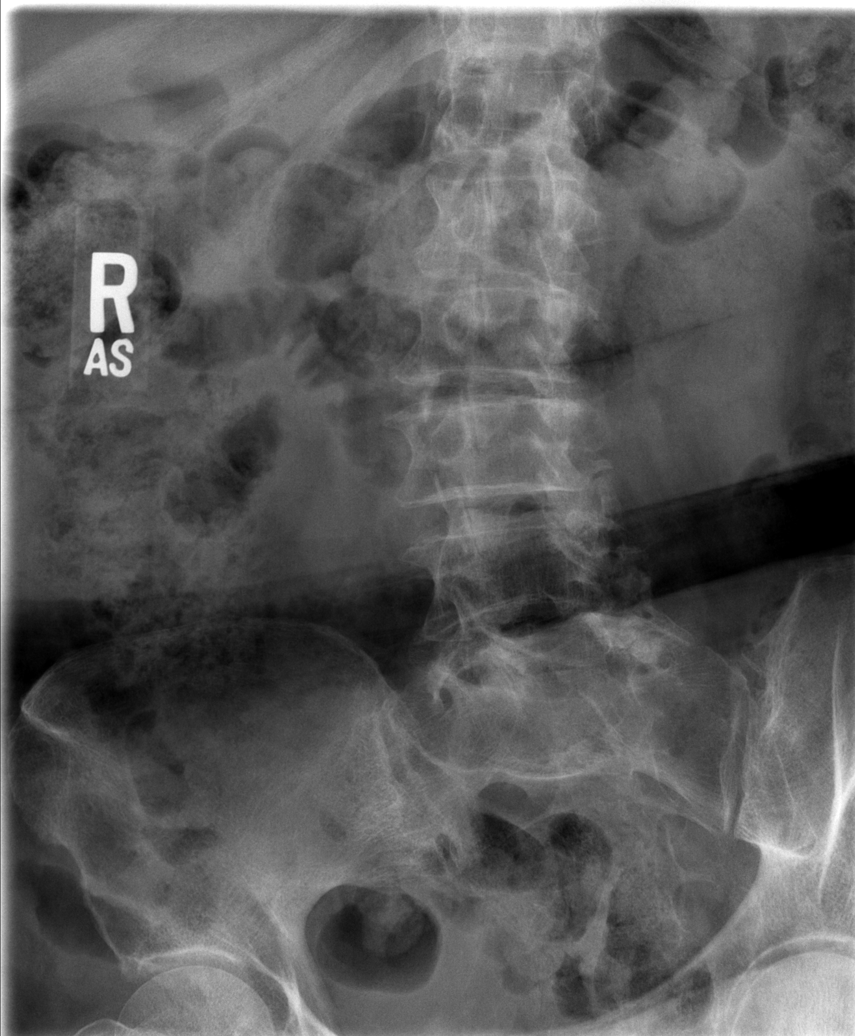

[t l-spine oblique exposure (2 of 2)]
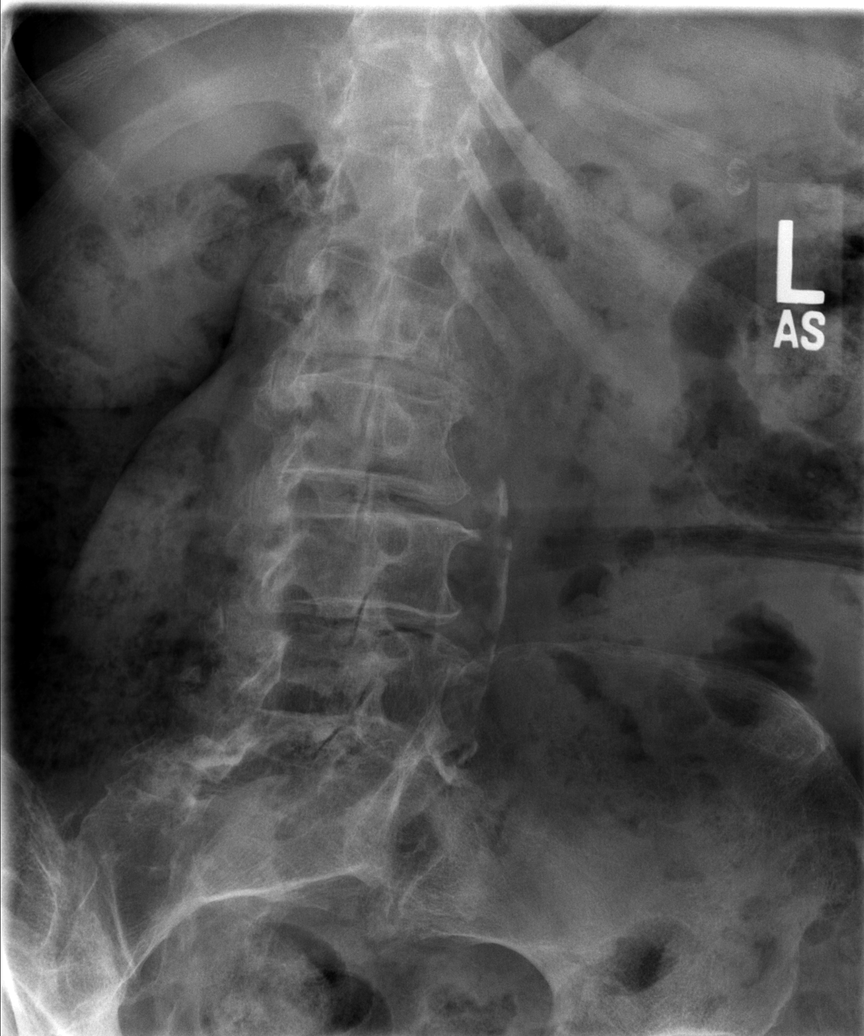

[t l-spine lat (1 of 2)]
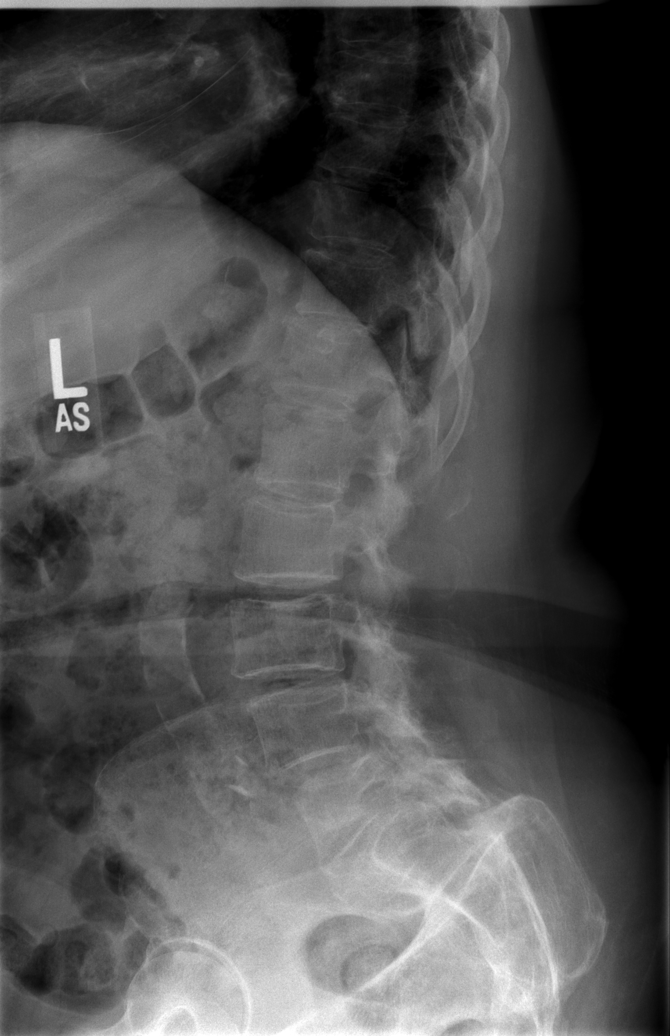

[t l-spine l5-s1 spot]
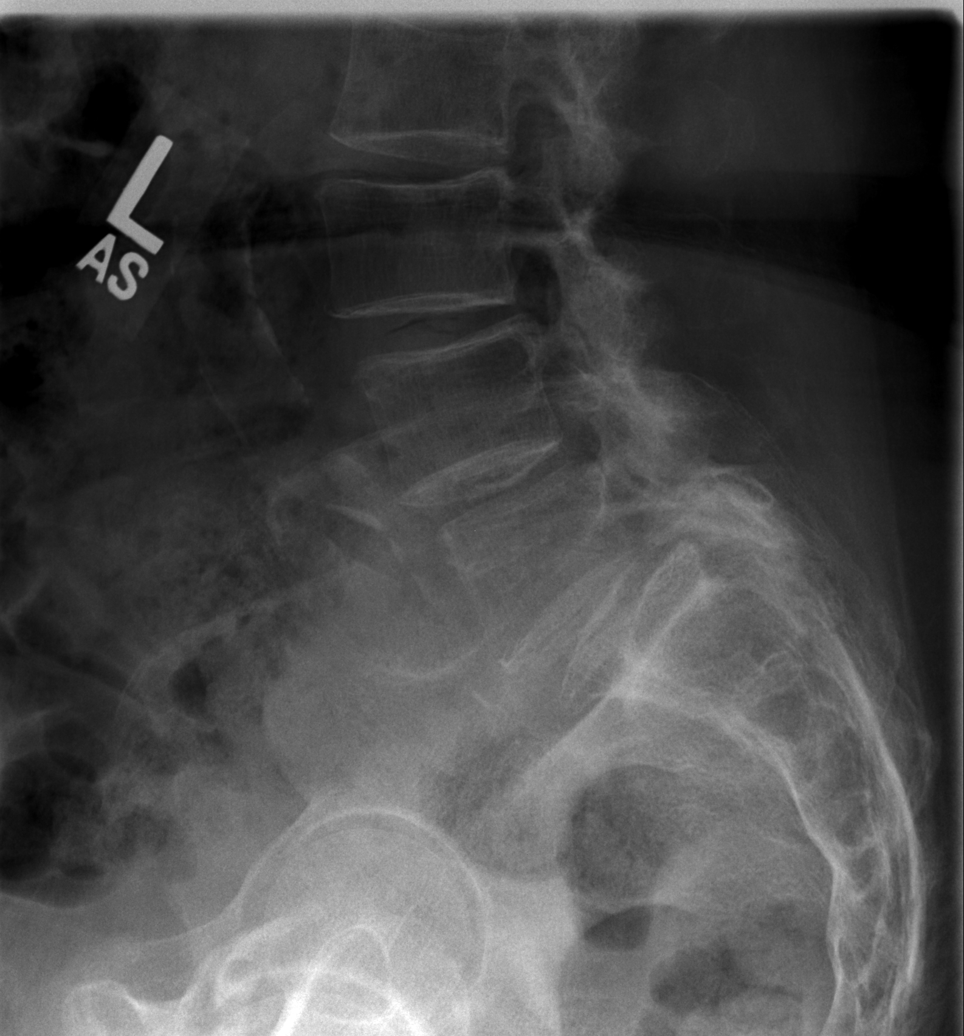

[t l-spine lat (2 of 2)]
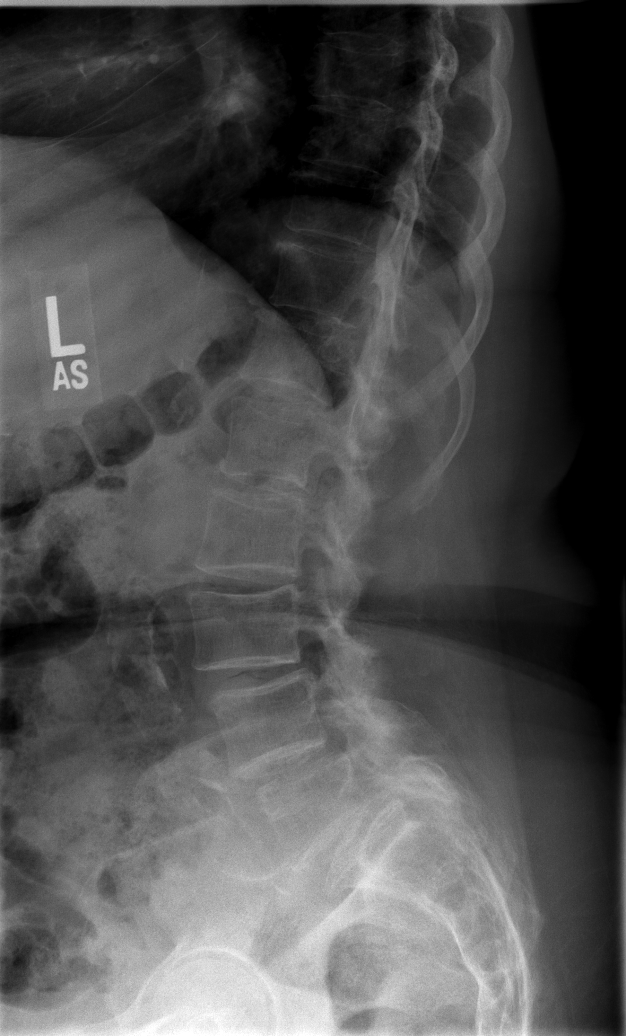

[6 of 6 positions shown; findings below may reference images not displayed]

FINDINGS: Five lumbar type vertebral bodies.  Minimal convex right
lumbar spine curvature.  Degenerative changes of the bilateral
sacroiliac joints.  Minimal osteoarthritis of the bilateral hips.

Aortic atherosclerosis.  Mild spondylosis, with loss of
intervertebral disc height at L3-L4 and L5-S1.  Maintenance of
vertebral body height.  Facet arthropathy at L4-S1.  L5-S1
anterolisthesis of 1.0 cm.
IMPRESSION: Spondylosis without vertebral body height loss.

1.0 cm L5-S1 anterolisthesis.

Atherosclerosis.

## 2014-06-20 ENCOUNTER — Other Ambulatory Visit: Payer: Self-pay | Admitting: Family Medicine

## 2014-06-21 NOTE — Telephone Encounter (Signed)
Medication filled x1 with no refills.   Requires office visit before any further refills can be given.   Letter sent.  

## 2014-06-22 ENCOUNTER — Other Ambulatory Visit: Payer: Self-pay | Admitting: Family Medicine

## 2014-06-22 MED ORDER — METFORMIN HCL 1000 MG PO TABS: ORAL_TABLET | ORAL | Status: DC

## 2014-06-22 NOTE — Telephone Encounter (Signed)
Med sent to pharm and pt needs ov will send letter 

## 2014-06-23 ENCOUNTER — Other Ambulatory Visit: Payer: Self-pay | Admitting: Family Medicine

## 2014-06-26 ENCOUNTER — Other Ambulatory Visit: Payer: Self-pay | Admitting: Family Medicine

## 2014-07-27 ENCOUNTER — Other Ambulatory Visit: Payer: Self-pay | Admitting: Family Medicine

## 2014-07-27 ENCOUNTER — Other Ambulatory Visit: Payer: Self-pay | Admitting: *Deleted

## 2014-07-27 NOTE — Telephone Encounter (Signed)
Received fax requesting refill on Tramadol.   Ok to refill??  Last office visit 11/25/2013.  Last refill 05/30/2014.

## 2014-07-27 NOTE — Telephone Encounter (Signed)
Medication refilled per protocol. 

## 2014-07-28 ENCOUNTER — Ambulatory Visit: Payer: Medicare Other | Admitting: Family Medicine

## 2014-07-28 MED ORDER — TRAMADOL HCL 50 MG PO TABS: ORAL_TABLET | ORAL | Status: DC

## 2014-07-28 NOTE — Telephone Encounter (Signed)
rx called in

## 2014-07-28 NOTE — Telephone Encounter (Signed)
ok 

## 2014-08-03 ENCOUNTER — Ambulatory Visit (INDEPENDENT_AMBULATORY_CARE_PROVIDER_SITE_OTHER): Payer: Medicare Other | Admitting: Physician Assistant

## 2014-08-03 ENCOUNTER — Encounter: Payer: Self-pay | Admitting: Physician Assistant

## 2014-08-03 VITALS — BP 136/84 | HR 88 | Temp 98.5°F | Resp 18 | Wt 176.0 lb

## 2014-08-03 DIAGNOSIS — E119 Type 2 diabetes mellitus without complications: Secondary | ICD-10-CM | POA: Diagnosis not present

## 2014-08-03 DIAGNOSIS — E785 Hyperlipidemia, unspecified: Secondary | ICD-10-CM | POA: Diagnosis not present

## 2014-08-03 DIAGNOSIS — I10 Essential (primary) hypertension: Secondary | ICD-10-CM | POA: Diagnosis not present

## 2014-08-03 DIAGNOSIS — K219 Gastro-esophageal reflux disease without esophagitis: Secondary | ICD-10-CM

## 2014-08-03 LAB — COMPLETE METABOLIC PANEL WITH GFR
ALBUMIN: 4 g/dL (ref 3.5–5.2)
ALT: 10 U/L (ref 0–35)
AST: 15 U/L (ref 0–37)
Alkaline Phosphatase: 62 U/L (ref 39–117)
BUN: 17 mg/dL (ref 6–23)
CO2: 23 mEq/L (ref 19–32)
Calcium: 9.3 mg/dL (ref 8.4–10.5)
Chloride: 104 mEq/L (ref 96–112)
Creat: 0.89 mg/dL (ref 0.50–1.10)
GFR, EST AFRICAN AMERICAN: 73 mL/min
GFR, EST NON AFRICAN AMERICAN: 64 mL/min
GLUCOSE: 108 mg/dL — AB (ref 70–99)
POTASSIUM: 4.7 meq/L (ref 3.5–5.3)
SODIUM: 140 meq/L (ref 135–145)
TOTAL PROTEIN: 6.6 g/dL (ref 6.0–8.3)
Total Bilirubin: 0.6 mg/dL (ref 0.2–1.2)

## 2014-08-03 LAB — HEMOGLOBIN A1C
HEMOGLOBIN A1C: 6.2 % — AB (ref <5.7)
MEAN PLASMA GLUCOSE: 131 mg/dL — AB (ref <117)

## 2014-08-03 LAB — LIPID PANEL
Cholesterol: 163 mg/dL (ref 0–200)
HDL: 39 mg/dL — ABNORMAL LOW (ref >39)
LDL Cholesterol: 98 mg/dL (ref 0–99)
Total CHOL/HDL Ratio: 4.2 Ratio
Triglycerides: 131 mg/dL (ref <150)
VLDL: 26 mg/dL (ref 0–40)

## 2014-08-03 MED ORDER — ASPIRIN 81 MG PO TABS: 81.0000 mg | ORAL_TABLET | Freq: Every day | ORAL | Status: AC

## 2014-08-03 NOTE — Progress Notes (Signed)
Subjective:    Patient ID: Claudia [name], female    DOB: [birth year], 73 y.o.   MRN: [id number]  HPI   Patient is here for followup of her type 2 diabetes mellitus, hypertension, hyperlipidemia.  She is here with her daughter who speaks [place].  Her last office visit at our office was 11/25/2013 with Dr. Tanya NonesPickard. I have reviewed his note today.  At that time he placed a referral to podiatry. I asked about this but they said that they did not go to that appointment. Is that that was because she had an ingrown toenail but says that that resolved and she is having no problems that she did not feel that she needed to see a foot doctor.  At that appointment April 2015 he also did referral to ophthalmology. They report that she did see ophthalmology and saw Dr. Birder RobsonSchapiro. Says that she did get new glasses. He mention something about glaucoma but then at the same time says that they were told that they she was doing fine and needed no new treatment.--???  She did start taking baby aspirin daily.  She continues to check fasting blood sugars in the morning and is getting good readings mostly in the 70s. He did bring in her log sheet. This morning reading was 82. Lowest reading has been 69. Says that at night if she eats something sweet then the highest reading she gets the next morning is been 104.  They have no complaints today.  Is taking blood pressure medication as directed with no adverse effects. No lower extremity edema and no lightheadedness.  Is taking cholesterol medication as directed with no adverse effects. No myalgias no right upper quadrant pain.   Past Medical History  Diagnosis Date  . Hypertension   . Hyperlipidemia   . Diabetes mellitus without complication   . Anemia   . GERD (gastroesophageal reflux disease)     chronic gastritis  . Arthritis    History reviewed. No pertinent past surgical history. Current Outpatient Prescriptions on File Prior to Visit    Medication Sig Dispense Refill  . BAYER CONTOUR TEST test strip TEST SUGAR ONCE A DAY 50 each 11  . BENICAR 40 MG tablet TOME UNA TABLETA POR VIA ORAL TODOS LOS DIAS. NEEDS OFFICE VISIT AND LABS DONE 30 tablet 0  . CRESTOR 10 MG tablet TAKE 1 TABLET BY MOUTH DAILY 30 tablet 5  . hydrochlorothiazide (HYDRODIURIL) 25 MG tablet TAKE 1 TABLET (25 MG TOTAL) BY MOUTH DAILY. 90 tablet 0  . metFORMIN (GLUCOPHAGE) 1000 MG tablet TAKE 1 TABLET BY MOUTH TWICE A DAY 60 tablet 0  . NAPROXEN DR 500 MG EC tablet TAKE 1 TABLET BY MOUTH TWICE A DAY WITH A MEAL 60 tablet 5  . traMADol (ULTRAM) 50 MG tablet TAKE 1 TABLET BY MOUTH EVERY 6 HOURS AS NEEDED FOR PAIN 120 tablet 0   No current facility-administered medications on file prior to visit.   No Known Allergies History   Social History  . Marital Status: Single    Spouse Name: N/A    Number of Children: N/A  . Years of Education: N/A   Occupational History  . Not on file.   Social History Main Topics  . Smoking status: Never Smoker   . Smokeless tobacco: Never Used  . Alcohol Use: No  . Drug Use: No  . Sexual Activity: Not Currently   Other Topics Concern  . Not on file   Social History Narrative  Review of Systems     Objective:   Physical Exam  Vitals reviewed. Constitutional: She is oriented to person, place, and time. She appears well-developed and well-nourished.   Neck: Neck supple. No JVD present. No thyromegaly present.  Cardiovascular: Normal rate, regular rhythm, normal heart sounds and intact distal pulses.  Exam reveals no gallop and no friction rub.   No murmur heard. Pulmonary/Chest: Effort normal and breath sounds normal. No respiratory distress. She has no wheezes. She has no rales. She exhibits no tenderness.  Abdominal: Soft. Bowel sounds are normal. She exhibits no distension and no mass. There is no tenderness. There is no rebound and no guarding.  Musculoskeletal: She exhibits no edema.  Neurological: She  is alert and oriented to person, place, and time. She has normal reflexes. She displays normal reflexes. No cranial nerve deficit. She exhibits normal muscle tone. Coordination normal.  Diabetic foot exam:: Inspection is normal. No wounds.          Assessment & Plan:    1. Diabetes mellitus without complication - aspirin 81 MG tablet; Take 1 tablet (81 mg total) by mouth daily.  Dispense: 30 tablet; Refill: 11 - COMPLETE METABOLIC PANEL WITH GFR - Hemoglobin A1c   She had microalbumin 11/25/2013  She is on aspirin 81 mg daily She is on ARB--- Benicar 40 She is on statin--- On Crestor 10  She did see ophthalmology Dr. Nile RiggsShapiro--- since her 11/2013 office visit   2. Hyperlipidemia On Crestor - COMPLETE METABOLIC PANEL WITH GFR - Lipid panel  3. Essential hypertension On ARB - COMPLETE METABOLIC PANEL WITH GFR  4. Gastroesophageal reflux disease, esophagitis presence not specified - COMPLETE METABOLIC PANEL WITH GFR  Needs routine office visit in 3 months. Follow-up sooner if needed.        ----Frazier RichardsMary Beth Dixon, PA         [facility]

## 2014-08-08 ENCOUNTER — Encounter: Payer: Self-pay | Admitting: Family Medicine

## 2014-08-16 ENCOUNTER — Other Ambulatory Visit: Payer: Self-pay | Admitting: Family Medicine

## 2014-08-16 NOTE — Telephone Encounter (Signed)
Medication refilled per protocol. 

## 2014-08-24 ENCOUNTER — Other Ambulatory Visit: Payer: Self-pay | Admitting: Family Medicine

## 2014-08-24 NOTE — Telephone Encounter (Signed)
Refill appropriate and filled per protocol. 

## 2014-08-25 ENCOUNTER — Other Ambulatory Visit: Payer: Self-pay | Admitting: Family Medicine

## 2014-08-25 MED ORDER — FLUTICASONE PROPIONATE 50 MCG/ACT NA SUSP: 2.0000 | Freq: Every day | NASAL | Status: DC

## 2014-08-25 NOTE — Telephone Encounter (Signed)
Med sent to pharm 

## 2014-09-05 ENCOUNTER — Other Ambulatory Visit: Payer: Self-pay | Admitting: Family Medicine

## 2014-09-06 ENCOUNTER — Other Ambulatory Visit: Payer: Self-pay | Admitting: Family Medicine

## 2014-09-06 NOTE — Telephone Encounter (Signed)
Requesting refill on Tramadol 50mg  - Last refill 07/28/2014

## 2014-09-08 MED ORDER — TRAMADOL HCL 50 MG PO TABS: ORAL_TABLET | ORAL | Status: DC

## 2014-09-08 NOTE — Telephone Encounter (Signed)
Med phoned in °

## 2014-09-08 NOTE — Telephone Encounter (Signed)
ok 

## 2014-09-30 ENCOUNTER — Other Ambulatory Visit: Payer: Self-pay | Admitting: Family Medicine

## 2014-09-30 NOTE — Telephone Encounter (Signed)
Medication refilled per protocol. 

## 2014-10-06 ENCOUNTER — Other Ambulatory Visit: Payer: Self-pay | Admitting: Family Medicine

## 2014-10-06 NOTE — Telephone Encounter (Signed)
Refill appropriate and filled per protocol. 

## 2014-11-01 ENCOUNTER — Other Ambulatory Visit: Payer: Self-pay | Admitting: Physician Assistant

## 2014-11-04 ENCOUNTER — Telehealth: Payer: Self-pay | Admitting: Family Medicine

## 2014-11-04 NOTE — Telephone Encounter (Signed)
Cindy from [facility] calling to get verbal on patients tramadol, she said she never received a hard copy for this  Please call her at [phone]

## 2014-11-05 ENCOUNTER — Other Ambulatory Visit: Payer: Self-pay | Admitting: Family Medicine

## 2014-11-05 ENCOUNTER — Other Ambulatory Visit: Payer: Self-pay | Admitting: Physician Assistant

## 2014-11-07 NOTE — Telephone Encounter (Signed)
Medication refilled per protocol. 

## 2014-11-07 NOTE — Telephone Encounter (Signed)
ok 

## 2014-11-07 NOTE — Telephone Encounter (Signed)
I called pharmacy.  Stated no refills of Tramadol have been requested.  Last refill was 09/08/14  #120.  LOV 08/03/14    Has appt 11/10/14  OK refill?

## 2014-11-08 ENCOUNTER — Other Ambulatory Visit: Payer: Self-pay | Admitting: *Deleted

## 2014-11-08 MED ORDER — TRAMADOL HCL 50 MG PO TABS: ORAL_TABLET | ORAL | Status: DC

## 2014-11-08 MED ORDER — NAPROXEN 500 MG PO TBEC: DELAYED_RELEASE_TABLET | ORAL | Status: DC

## 2014-11-08 NOTE — Telephone Encounter (Signed)
rx called in

## 2014-11-08 NOTE — Telephone Encounter (Signed)
Received fax requesting refill on naproxen.   Refill appropriate and filled per protocol. 

## 2014-11-10 ENCOUNTER — Encounter: Payer: Self-pay | Admitting: Family Medicine

## 2014-11-10 ENCOUNTER — Ambulatory Visit (INDEPENDENT_AMBULATORY_CARE_PROVIDER_SITE_OTHER): Payer: Medicare Other | Admitting: Family Medicine

## 2014-11-10 VITALS — BP 130/72 | HR 68 | Resp 16 | Ht 63.0 in | Wt 167.0 lb

## 2014-11-10 DIAGNOSIS — E785 Hyperlipidemia, unspecified: Secondary | ICD-10-CM

## 2014-11-10 DIAGNOSIS — I10 Essential (primary) hypertension: Secondary | ICD-10-CM | POA: Diagnosis not present

## 2014-11-10 DIAGNOSIS — E119 Type 2 diabetes mellitus without complications: Secondary | ICD-10-CM | POA: Diagnosis not present

## 2014-11-10 LAB — LIPID PANEL
Cholesterol: 126 mg/dL (ref 0–200)
HDL: 41 mg/dL — AB (ref >=46)
LDL Cholesterol: 63 mg/dL (ref 0–99)
Total CHOL/HDL Ratio: 3.1 Ratio
Triglycerides: 109 mg/dL (ref <150)
VLDL: 22 mg/dL (ref 0–40)

## 2014-11-10 LAB — HEMOGLOBIN A1C
HEMOGLOBIN A1C: 6.4 % — AB (ref <5.7)
Mean Plasma Glucose: 137 mg/dL — ABNORMAL HIGH (ref <117)

## 2014-11-10 LAB — COMPLETE METABOLIC PANEL WITH GFR
ALT: 8 U/L (ref 0–35)
AST: 14 U/L (ref 0–37)
Albumin: 4.3 g/dL (ref 3.5–5.2)
Alkaline Phosphatase: 59 U/L (ref 39–117)
BUN: 31 mg/dL — ABNORMAL HIGH (ref 6–23)
CO2: 22 mEq/L (ref 19–32)
CREATININE: 1.28 mg/dL — AB (ref 0.50–1.10)
Calcium: 9.4 mg/dL (ref 8.4–10.5)
Chloride: 104 mEq/L (ref 96–112)
GFR, Est African American: 47 mL/min — ABNORMAL LOW
GFR, Est Non African American: 41 mL/min — ABNORMAL LOW
Glucose, Bld: 86 mg/dL (ref 70–99)
Potassium: 4.8 mEq/L (ref 3.5–5.3)
Sodium: 138 mEq/L (ref 135–145)
Total Bilirubin: 0.4 mg/dL (ref 0.2–1.2)
Total Protein: 6.8 g/dL (ref 6.0–8.3)

## 2014-11-10 NOTE — Progress Notes (Signed)
Subjective:    Patient ID: Claudia [name], female    DOB: 29-Mar-1941, 74 y.o.   MRN: [id number]  HPI  Patient is here today for follow-up of her medical problems. She does complain of mild pain in both knees but the pain is relieved with the tramadol and naproxen. The pain is located on the medial joint lines bilaterally. There are visible arthritic changes in the knees. She is also mildly tender to palpation over the medial joint lines bilaterally. She is not interested in a cortisone shot at the present time. She is due for the pneumonia vaccine. Diabetic eye exam is due. Diabetic foot exam is performed today. Blood pressure is excellent 130/72. She denies any chest pain shortness of breath or dyspnea on exertion. She denies any myalgias or right upper quadrant pain on the Crestor. She denies any polyuria, polydipsia, or blurred vision. She denies any numbness or tingling in the feet. Past Medical History  Diagnosis Date  . Hypertension   . Hyperlipidemia   . Diabetes mellitus without complication   . Anemia   . GERD (gastroesophageal reflux disease)     chronic gastritis  . Arthritis    No past surgical history on file. Current Outpatient Prescriptions on File Prior to Visit  Medication Sig Dispense Refill  . aspirin 81 MG tablet Take 1 tablet (81 mg total) by mouth daily. 30 tablet 11  . BAYER CONTOUR TEST test strip TEST SUGAR ONCE A DAY 50 each 11  . BENICAR 40 MG tablet TOME UNA TABLETA POR VIA ORAL TODOS LOS DIAS. NEEDS OFFICE VISIT AND LABS DONE 30 tablet 5  . CRESTOR 10 MG tablet TOME UNA TABLETA POR VIA ORAL TODOS LOS DIAS 30 tablet 0  . fluticasone (FLONASE) 50 MCG/ACT nasal spray PLACE 2 SPRAYS INTO BOTH NOSTRILS DAILY. (Patient not taking: Reported on 11/10/2014) 16 g 11  . hydrochlorothiazide (HYDRODIURIL) 25 MG tablet TOME UNA TABLETA POR VIA ORAL TODOS LOS DIAS 90 tablet 0  . metFORMIN (GLUCOPHAGE) 1000 MG tablet TAKE 1 TABLET BY MOUTH TWICE A DAY 60 tablet 2  . naproxen  (NAPROXEN DR) 500 MG EC tablet TAKE 1 TABLET BY MOUTH TWICE A DAY WITH A MEAL 60 tablet 5  . traMADol (ULTRAM) 50 MG tablet TAKE 1 TABLET BY MOUTH EVERY 6 HOURS AS NEEDED FOR PAIN 120 tablet 0   No current facility-administered medications on file prior to visit.   No Known Allergies History   Social History  . Marital Status: Single    Spouse Name: N/A  . Number of Children: N/A  . Years of Education: N/A   Occupational History  . Not on file.   Social History Main Topics  . Smoking status: Never Smoker   . Smokeless tobacco: Never Used  . Alcohol Use: No  . Drug Use: No  . Sexual Activity: Not Currently   Other Topics Concern  . Not on file   Social History Narrative     Review of Systems  All other systems reviewed and are negative.      Objective:   Physical Exam  Constitutional: She appears well-developed and well-nourished.  Eyes: Conjunctivae and EOM are normal. Pupils are equal, round, and reactive to light.  Neck: Neck supple. No JVD present. No thyromegaly present.  Cardiovascular: Normal rate, regular rhythm, normal heart sounds and intact distal pulses.  Exam reveals no gallop and no friction rub.   No murmur heard. Pulmonary/Chest: Effort normal and breath sounds normal. No respiratory  distress. She has no wheezes. She has no rales.  Abdominal: Soft. Bowel sounds are normal. She exhibits no distension and no mass. There is no tenderness. There is no rebound and no guarding.  Musculoskeletal: She exhibits no edema.  Lymphadenopathy:    She has no cervical adenopathy.  Vitals reviewed.         Assessment & Plan:  Diabetes mellitus without complication - Plan: COMPLETE METABOLIC PANEL WITH GFR, Hemoglobin A1c, Lipid panel, Microalbumin, urine  Hyperlipidemia - Plan: COMPLETE METABOLIC PANEL WITH GFR, Hemoglobin A1c, Lipid panel, Microalbumin, urine  Essential hypertension - Plan: COMPLETE METABOLIC PANEL WITH GFR, Hemoglobin A1c, Lipid panel,  Microalbumin, urine  Patient's blood pressure is excellent. I'll make no changes in her blood pressure medication. I will check a hemoglobin A1c. Goal hemoglobin A1c is less than 6.5. I will also check a urine microalbumin. I will also check a fasting lipid panel. Goal LDL cholesterol is less than 100. I recommended a diabetic eye exam and I will schedule this for the patient. Diabetic foot exam is up-to-date. Patient is also taking an aspirin. Patient received Pneumovax 23 today in clinic

## 2014-11-11 ENCOUNTER — Encounter: Payer: Self-pay | Admitting: Family Medicine

## 2014-11-11 LAB — MICROALBUMIN, URINE: Microalb, Ur: 21.8 mg/dL — ABNORMAL HIGH (ref <2.0)

## 2014-11-21 ENCOUNTER — Other Ambulatory Visit: Payer: Self-pay | Admitting: Family Medicine

## 2014-11-29 ENCOUNTER — Encounter: Payer: Self-pay | Admitting: *Deleted

## 2014-12-26 ENCOUNTER — Telehealth: Payer: Self-pay | Admitting: Family Medicine

## 2014-12-26 MED ORDER — TRAMADOL HCL 50 MG PO TABS: ORAL_TABLET | ORAL | Status: DC

## 2014-12-26 NOTE — Telephone Encounter (Signed)
ok 

## 2014-12-26 NOTE — Telephone Encounter (Signed)
Medication called/sent to requested pharmacy  

## 2014-12-26 NOTE — Telephone Encounter (Signed)
Requesting refill on Tramadol - ? OK to Refill - Last refill - 11/08/14

## 2015-01-25 ENCOUNTER — Other Ambulatory Visit: Payer: Self-pay | Admitting: Family Medicine

## 2015-01-25 NOTE — Telephone Encounter (Signed)
ok 

## 2015-01-25 NOTE — Telephone Encounter (Signed)
?   OK to Refill  

## 2015-01-25 NOTE — Telephone Encounter (Signed)
Ok to refill??   Last office visit 11/10/2014.   Last refill 12/23/2014.

## 2015-01-26 NOTE — Telephone Encounter (Signed)
Medication called to pharmacy. 

## 2015-01-26 NOTE — Telephone Encounter (Signed)
ok 

## 2015-03-31 ENCOUNTER — Other Ambulatory Visit: Payer: Self-pay | Admitting: Physician Assistant

## 2015-03-31 ENCOUNTER — Encounter: Payer: Self-pay | Admitting: Family Medicine

## 2015-03-31 NOTE — Telephone Encounter (Signed)
Medication refill for one time only.  Patient needs to be seen.  Letter sent for patient to call and schedule 

## 2015-04-02 ENCOUNTER — Other Ambulatory Visit: Payer: Self-pay | Admitting: Family Medicine

## 2015-04-03 NOTE — Telephone Encounter (Signed)
Medication called to pharmacy. 

## 2015-04-03 NOTE — Telephone Encounter (Signed)
ok 

## 2015-04-03 NOTE — Telephone Encounter (Signed)
?   OK to Refill  

## 2015-04-27 ENCOUNTER — Other Ambulatory Visit: Payer: Self-pay | Admitting: Family Medicine

## 2015-04-27 NOTE — Telephone Encounter (Signed)
Refill appropriate and filled per protocol. 

## 2015-05-04 ENCOUNTER — Telehealth: Payer: Self-pay | Admitting: *Deleted

## 2015-05-04 ENCOUNTER — Encounter: Payer: Self-pay | Admitting: *Deleted

## 2015-05-04 MED ORDER — LOSARTAN POTASSIUM 100 MG PO TABS: 100.0000 mg | ORAL_TABLET | Freq: Every day | ORAL | Status: DC

## 2015-05-04 NOTE — Telephone Encounter (Signed)
Received fax from patient insurance.   Requested to change patient Benicar to cheaper alternative.   PA made aware and new orders obtained to switch medication to Losartan  PO QD.   Letter sent to patient to make aware.   New prescription sent to pharmacy.

## 2015-05-23 ENCOUNTER — Ambulatory Visit (INDEPENDENT_AMBULATORY_CARE_PROVIDER_SITE_OTHER): Payer: Medicare Other | Admitting: Family Medicine

## 2015-05-23 ENCOUNTER — Encounter: Payer: Self-pay | Admitting: Family Medicine

## 2015-05-23 VITALS — BP 132/84 | HR 76 | Temp 97.9°F | Resp 18 | Wt 166.0 lb

## 2015-05-23 DIAGNOSIS — E119 Type 2 diabetes mellitus without complications: Secondary | ICD-10-CM | POA: Diagnosis not present

## 2015-05-23 DIAGNOSIS — I10 Essential (primary) hypertension: Secondary | ICD-10-CM | POA: Diagnosis not present

## 2015-05-23 DIAGNOSIS — Z23 Encounter for immunization: Secondary | ICD-10-CM

## 2015-05-23 DIAGNOSIS — E785 Hyperlipidemia, unspecified: Secondary | ICD-10-CM

## 2015-05-23 LAB — CBC WITH DIFFERENTIAL/PLATELET
Basophils Absolute: 0 10*3/uL (ref 0.0–0.1)
Basophils Relative: 0 % (ref 0–1)
Eosinophils Absolute: 0.2 10*3/uL (ref 0.0–0.7)
Eosinophils Relative: 2 % (ref 0–5)
HEMATOCRIT: 39 % (ref 36.0–46.0)
HEMOGLOBIN: 12.7 g/dL (ref 12.0–15.0)
LYMPHS ABS: 1.9 10*3/uL (ref 0.7–4.0)
LYMPHS PCT: 24 % (ref 12–46)
MCH: 26.8 pg (ref 26.0–34.0)
MCHC: 32.6 g/dL (ref 30.0–36.0)
MCV: 82.3 fL (ref 78.0–100.0)
MONO ABS: 0.6 10*3/uL (ref 0.1–1.0)
MONOS PCT: 7 % (ref 3–12)
MPV: 10 fL (ref 8.6–12.4)
NEUTROS ABS: 5.4 10*3/uL (ref 1.7–7.7)
Neutrophils Relative %: 67 % (ref 43–77)
Platelets: 295 10*3/uL (ref 150–400)
RBC: 4.74 MIL/uL (ref 3.87–5.11)
RDW: 17.1 % — ABNORMAL HIGH (ref 11.5–15.5)
WBC: 8.1 10*3/uL (ref 4.0–10.5)

## 2015-05-23 LAB — COMPLETE METABOLIC PANEL WITH GFR
ALBUMIN: 4.2 g/dL (ref 3.6–5.1)
ALK PHOS: 68 U/L (ref 33–130)
ALT: 9 U/L (ref 6–29)
AST: 16 U/L (ref 10–35)
BUN: 23 mg/dL (ref 7–25)
CALCIUM: 9.5 mg/dL (ref 8.6–10.4)
CO2: 27 mmol/L (ref 20–31)
CREATININE: 0.92 mg/dL (ref 0.60–0.93)
Chloride: 101 mmol/L (ref 98–110)
GFR, Est African American: 70 mL/min (ref >=60)
GFR, Est Non African American: 61 mL/min (ref >=60)
Glucose, Bld: 99 mg/dL (ref 70–99)
POTASSIUM: 4.2 mmol/L (ref 3.5–5.3)
Sodium: 138 mmol/L (ref 135–146)
Total Bilirubin: 0.5 mg/dL (ref 0.2–1.2)
Total Protein: 6.8 g/dL (ref 6.1–8.1)

## 2015-05-23 LAB — LIPID PANEL
CHOLESTEROL: 115 mg/dL — AB (ref 125–200)
HDL: 49 mg/dL (ref >=46)
LDL Cholesterol: 50 mg/dL (ref <130)
Total CHOL/HDL Ratio: 2.3 Ratio (ref <=5.0)
Triglycerides: 81 mg/dL (ref <150)
VLDL: 16 mg/dL (ref <30)

## 2015-05-23 NOTE — Progress Notes (Signed)
Subjective:    Patient ID: Claudia [name], female    DOB: [birth year], 74 y.o.   MRN: [id number]  HPI  11/10/14 Patient is here today for follow-up of her medical problems. She does complain of mild pain in both knees but the pain is relieved with the tramadol and naproxen. The pain is located on the medial joint lines bilaterally. There are visible arthritic changes in the knees. She is also mildly tender to palpation over the medial joint lines bilaterally. She is not interested in a cortisone shot at the present time. She is due for the pneumonia vaccine. Diabetic eye exam is due. Diabetic foot exam is performed today. Blood pressure is excellent 130/72. She denies any chest pain shortness of breath or dyspnea on exertion. She denies any myalgias or right upper quadrant pain on the Crestor. She denies any polyuria, polydipsia, or blurred vision. She denies any numbness or tingling in the feet.  At that time, my plan was: Patient's blood pressure is excellent. I'll make no changes in her blood pressure medication. I will check a hemoglobin A1c. Goal hemoglobin A1c is less than 6.5. I will also check a urine microalbumin. I will also check a fasting lipid panel. Goal LDL cholesterol is less than 100. I recommended a diabetic eye exam and I will schedule this for the patient. Diabetic foot exam is up-to-date. Patient is also taking an aspirin. Patient received Pneumovax 23 today in clinic  05/23/15 Patient is here today for follow-up. Her fasting blood sugars are exceptionally well controlled and tend to range between 70 and 100. She is only taking metformin 500 mg by mouth twice a day. She denies any hypoglycemic episodes. She has had occasional hypoglycemic episodes into the 60s but these are asymptomatic for this individual. She denies any chest pain or shortness of breath or dyspnea on exertion. Her blood pressure today is well controlled at 132/84. She denies any myalgias or right upper quadrant pain  on her Crestor. She is due to recheck a fasting lipid panel along with her diabetic labs. She denies any neuropathy in the feet. She denies any numbness or tingling in the feet. She does continue have severe pain in her left knee secondary to osteoarthritis. She is taking naproxen twice a day and also taking tramadol in addition to that. I broached the subject of a cortisone injection in the knee with the patient does not want that today. She is very afraid of needles and refuses. Past Medical History  Diagnosis Date  . Hypertension   . Hyperlipidemia   . Diabetes mellitus without complication (HCC)   . Anemia   . GERD (gastroesophageal reflux disease)     chronic gastritis  . Arthritis    No past surgical history on file. Current Outpatient Prescriptions on File Prior to Visit  Medication Sig Dispense Refill  . aspirin 81 MG tablet Take 1 tablet (81 mg total) by mouth daily. 30 tablet 11  . BAYER CONTOUR TEST test strip TEST SUGAR ONCE A DAY 50 each 11  . CRESTOR 10 MG tablet TOME UNA TABLETA POR VIA ORAL TODOS LOS DIAS 30 tablet 0  . hydrochlorothiazide (HYDRODIURIL) 25 MG tablet TOME UNA TABLETA POR VIA ORAL TODOS LOS DIAS 90 tablet 0  . metFORMIN (GLUCOPHAGE) 1000 MG tablet TOME UNA TABLETA DOS VECES AL DIA 60 tablet 0  . naproxen (NAPROXEN DR) 500 MG EC tablet TAKE 1 TABLET BY MOUTH TWICE A DAY WITH A MEAL 60 tablet 5  .  traMADol (ULTRAM) 50 MG tablet TOME UNA TABLETA POR VIA ORAL CADA SEIS HORAS CUANDO SEA NECESARIO PARA EL DOLOR 120 tablet 0  . fluticasone (FLONASE) 50 MCG/ACT nasal spray PLACE 2 SPRAYS INTO BOTH NOSTRILS DAILY. (Patient not taking: Reported on 11/10/2014) 16 g 11  . losartan (COZAAR) 100 MG tablet Take 1 tablet (100 mg total) by mouth daily. 30 tablet 0   No current facility-administered medications on file prior to visit.   No Known Allergies Social History   Social History  . Marital Status: Single    Spouse Name: N/A  . Number of Children: N/A  . Years of  Education: N/A   Occupational History  . Not on file.   Social History Main Topics  . Smoking status: Never Smoker   . Smokeless tobacco: Never Used  . Alcohol Use: No  . Drug Use: No  . Sexual Activity: Not Currently   Other Topics Concern  . Not on file   Social History Narrative     Review of Systems  All other systems reviewed and are negative.      Objective:   Physical Exam  Constitutional: She appears well-developed and well-nourished.  Eyes: Conjunctivae and EOM are normal. Pupils are equal, round, and reactive to light.  Neck: Neck supple. No JVD present. No thyromegaly present.  Cardiovascular: Normal rate, regular rhythm, normal heart sounds and intact distal pulses.  Exam reveals no gallop and no friction rub.   No murmur heard. Pulmonary/Chest: Effort normal and breath sounds normal. No respiratory distress. She has no wheezes. She has no rales.  Abdominal: Soft. Bowel sounds are normal. She exhibits no distension and no mass. There is no tenderness. There is no rebound and no guarding.  Musculoskeletal: She exhibits no edema.  Lymphadenopathy:    She has no cervical adenopathy.  Vitals reviewed.         Assessment & Plan:  Need for prophylactic vaccination and inoculation against influenza - Plan: Flu Vaccine QUAD 36+ mos IM  Controlled type 2 diabetes mellitus without complication, without long-term current use of insulin (HCC) - Plan: CBC with Differential/Platelet, COMPLETE METABOLIC PANEL WITH GFR, Lipid panel, Hemoglobin A1c, Microalbumin, urine  Hyperlipidemia  Essential hypertension  blood pressure is well controlled. I will make no changes in her medications at this time. Diabetes seems well controlled. I will check a hemoglobin A1c and if less than 7, I will likely discontinue metformin given the fact she is having hypoglycemic episodes. I will also check a urine microalbumin to monitor for diabetic nephropathy. I will check a fasting lipid  panel. Goal LDL cholesterol is less than 100. Diabetic foot exam is performed today and is normal. Immunizations are up-to-date. The patient is given her flu shot today in clinic

## 2015-05-24 LAB — HEMOGLOBIN A1C
Hgb A1c MFr Bld: 6.4 % — ABNORMAL HIGH (ref <5.7)
MEAN PLASMA GLUCOSE: 137 mg/dL — AB (ref <117)

## 2015-05-24 LAB — MICROALBUMIN, URINE: MICROALB UR: 1.6 mg/dL (ref <2.0)

## 2015-05-25 ENCOUNTER — Other Ambulatory Visit: Payer: Self-pay | Admitting: Family Medicine

## 2015-05-25 ENCOUNTER — Encounter: Payer: Self-pay | Admitting: *Deleted

## 2015-05-25 NOTE — Telephone Encounter (Signed)
Refill appropriate and filled per protocol. 

## 2015-05-28 ENCOUNTER — Other Ambulatory Visit: Payer: Self-pay | Admitting: Family Medicine

## 2015-05-29 NOTE — Telephone Encounter (Signed)
ok 

## 2015-05-29 NOTE — Telephone Encounter (Signed)
Medication called to pharmacy. 

## 2015-05-29 NOTE — Telephone Encounter (Signed)
Ok to refill??  Last office visit 05/23/2015.  Last refill 04/03/2015.

## 2015-06-02 ENCOUNTER — Other Ambulatory Visit: Payer: Self-pay | Admitting: Physician Assistant

## 2015-06-02 ENCOUNTER — Other Ambulatory Visit: Payer: Self-pay | Admitting: Family Medicine

## 2015-06-02 NOTE — Telephone Encounter (Signed)
Medication refilled per protocol. 

## 2015-06-18 ENCOUNTER — Other Ambulatory Visit: Payer: Self-pay | Admitting: Physician Assistant

## 2015-06-19 NOTE — Telephone Encounter (Signed)
Medication refilled per protocol. 

## 2015-07-01 ENCOUNTER — Other Ambulatory Visit: Payer: Self-pay | Admitting: Family Medicine

## 2015-07-19 ENCOUNTER — Other Ambulatory Visit: Payer: Self-pay | Admitting: Family Medicine

## 2015-07-20 NOTE — Telephone Encounter (Signed)
Medication called to pharmacy. 

## 2015-07-20 NOTE — Telephone Encounter (Signed)
ok 

## 2015-07-20 NOTE — Telephone Encounter (Signed)
Ok to refill??  Last office visit 05/23/2015.  Last refill 05/29/2015.

## 2015-08-08 ENCOUNTER — Ambulatory Visit (INDEPENDENT_AMBULATORY_CARE_PROVIDER_SITE_OTHER): Payer: Medicare Other | Admitting: Family Medicine

## 2015-08-08 ENCOUNTER — Encounter: Payer: Self-pay | Admitting: Family Medicine

## 2015-08-08 VITALS — BP 136/100 | HR 82 | Temp 98.0°F | Resp 18 | Wt 168.0 lb

## 2015-08-08 DIAGNOSIS — M4806 Spinal stenosis, lumbar region: Secondary | ICD-10-CM | POA: Diagnosis not present

## 2015-08-08 DIAGNOSIS — R0789 Other chest pain: Secondary | ICD-10-CM

## 2015-08-08 DIAGNOSIS — M48061 Spinal stenosis, lumbar region without neurogenic claudication: Secondary | ICD-10-CM

## 2015-08-08 DIAGNOSIS — R0609 Other forms of dyspnea: Secondary | ICD-10-CM | POA: Diagnosis not present

## 2015-08-08 MED ORDER — METOPROLOL SUCCINATE ER 25 MG PO TB24: 25.0000 mg | ORAL_TABLET | Freq: Every day | ORAL | Status: DC

## 2015-08-08 NOTE — Progress Notes (Signed)
Subjective:    Patient ID: Claudia DockerMaria [name], female    DOB: Nov 14, 1940, 74 y.o.   MRN: [id number]  HPI  Patient is a 74 year old Hispanic female with a past medical history significant for diabetes, hypertension, and hyperlipidemia who presents with 2 weeks of worsening dyspnea on exertion. She denies any chest pain. However she is becoming significantly winded only walking 20 feet. One episode she became lightheaded like she may faint. She denies any palpitations. She also is complaining of pain in her lower back and in both hips. She has a known history of chronic low back pain after a fall. She blames all these symptoms on a change in her medication to the generic version of Crestor. She denies any angina. She denies any chest pain at rest.  EKG in the office today reveals PVCs and a left anterior fascicular block but there is no evidence of ischemia or infarction.  However I have no EKG to compare to. Patient had an MRI of the lumbar spine in 2014 which revealed: 1. Lumbar spondylosis and degenerative disc disease causing moderate impingement at L1-2, L3-4, L4-5, and L5-S1; and mild impingement at L2-3 and potentially at T11-12 as detailed above. I believe this is most likely causing her low back pain and she may even have some neurogenic claudication in her hips. She would likely benefit from a repeat MRI however I am very concerned about her worsening dyspnea on exertion and believe we need to proceed with a cardiac workup first. Past Medical History  Diagnosis Date  . Hypertension   . Hyperlipidemia   . Diabetes mellitus without complication (HCC)   . Anemia   . GERD (gastroesophageal reflux disease)     chronic gastritis  . Arthritis    No past surgical history on file. Current Outpatient Prescriptions on File Prior to Visit  Medication Sig Dispense Refill  . aspirin 81 MG tablet Take 1 tablet (81 mg total) by mouth daily. 30 tablet 11  . BAYER CONTOUR TEST test strip TEST SUGAR ONCE  A DAY 50 each 11  . CRESTOR 10 MG tablet TOME UNA TABLETA POR VIA ORAL TODOS LOS DIAS 90 tablet 1  . fluticasone (FLONASE) 50 MCG/ACT nasal spray PLACE 2 SPRAYS INTO BOTH NOSTRILS DAILY. 16 g 11  . hydrochlorothiazide (HYDRODIURIL) 25 MG tablet TOME UNA TABLETA POR VIA ORAL TODOS LOS DIAS 90 tablet 0  . loratadine (CLARITIN) 10 MG tablet Take 10 mg by mouth daily.    Marland Kitchen. losartan (COZAAR) 100 MG tablet TAKE 1 TABLET (100 MG TOTAL) BY MOUTH DAILY. 90 tablet 1  . metFORMIN (GLUCOPHAGE) 1000 MG tablet TOME UNA TABLETA DOS VECES AL DIA 60 tablet 3  . NAPROXEN DR 500 MG EC tablet TAKE 1 TABLET BY MOUTH TWICE A DAY WITH A MEAL 60 tablet 5  . traMADol (ULTRAM) 50 MG tablet Take 1 tablet (50 mg total) by mouth every 6 (six) hours as needed. 120 tablet 0   No current facility-administered medications on file prior to visit.   No Known Allergies Social History   Social History  . Marital Status: Single    Spouse Name: N/A  . Number of Children: N/A  . Years of Education: N/A   Occupational History  . Not on file.   Social History Main Topics  . Smoking status: Never Smoker   . Smokeless tobacco: Never Used  . Alcohol Use: No  . Drug Use: No  . Sexual Activity: Not Currently   Other Topics  Concern  . Not on file   Social History Narrative    Review of Systems  All other systems reviewed and are negative.      Objective:   Physical Exam  Constitutional: She appears well-developed and well-nourished.  Neck: Neck supple. No JVD present.  Cardiovascular: Normal rate, regular rhythm and normal heart sounds.   Pulmonary/Chest: Effort normal and breath sounds normal. No respiratory distress. She has no wheezes. She has no rales.  Abdominal: Soft. Bowel sounds are normal.  Musculoskeletal: She exhibits no edema.       Lumbar back: She exhibits decreased range of motion, tenderness and pain.  Vitals reviewed.         Assessment & Plan:  Other chest pain - Plan: EKG 12-Lead,  metoprolol succinate (TOPROL-XL) 25 MG 24 hr tablet  Dyspnea on exertion - Plan: Ambulatory referral to Cardiology  Spinal stenosis of lumbar region  Patient is convinced that this is coming from the generic version of Crestor. I am concerned given her age, her risk factors including diabetes and hypertension and hyperlipidemia and a recent change in her exercise tolerance that her dyspnea on exertion may indicate underlying cardiovascular problems. I have recommended an urgent referral to cardiology for stress test and/or an echocardiogram. I will defer workup of her low back pain until this is performed as I believe that takes precedence. I will also start the patient on Toprol-XL 25 mg by mouth daily given her elevated blood pressure and my presumed cardiac in etiology. I believe her low back pain radiating into both hips may be an evidence of neurogenic claudication from spinal stenosis. She may need a repeat MRI of the lumbar spine. However cardiac workup takes precedence. In the meantime she can use tramadol 100 mg every 8 hours as needed for pain.

## 2015-09-16 ENCOUNTER — Other Ambulatory Visit: Payer: Self-pay | Admitting: Family Medicine

## 2015-09-18 NOTE — Telephone Encounter (Signed)
ok 

## 2015-09-18 NOTE — Telephone Encounter (Signed)
Medication called to pharmacy. 

## 2015-09-18 NOTE — Telephone Encounter (Signed)
Ok to refill??  Last office visit 08/08/2015.  Last refill 07/20/2015.

## 2015-10-07 ENCOUNTER — Other Ambulatory Visit: Payer: Self-pay | Admitting: Family Medicine

## 2015-10-20 ENCOUNTER — Other Ambulatory Visit: Payer: Self-pay | Admitting: Family Medicine

## 2015-11-05 ENCOUNTER — Other Ambulatory Visit: Payer: Self-pay | Admitting: Family Medicine

## 2015-11-06 NOTE — Telephone Encounter (Signed)
Medication called to pharmacy. 

## 2015-11-06 NOTE — Telephone Encounter (Signed)
ok 

## 2015-11-06 NOTE — Telephone Encounter (Signed)
Ok to refill??  Last office visit 08/08/2015.  Last refill 09/18/2015.

## 2015-11-11 ENCOUNTER — Other Ambulatory Visit: Payer: Self-pay | Admitting: Family Medicine

## 2015-11-13 ENCOUNTER — Other Ambulatory Visit: Payer: Self-pay | Admitting: Family Medicine

## 2015-11-23 ENCOUNTER — Other Ambulatory Visit: Payer: Self-pay | Admitting: Family Medicine

## 2015-11-23 NOTE — Telephone Encounter (Signed)
Medication refilled per protocol. 

## 2015-11-24 ENCOUNTER — Other Ambulatory Visit: Payer: Self-pay | Admitting: Family Medicine

## 2016-01-04 ENCOUNTER — Other Ambulatory Visit: Payer: Self-pay | Admitting: Family Medicine

## 2016-01-04 NOTE — Telephone Encounter (Signed)
Ok to refill??  Last office visit 08/08/2015.  Last refill 11/06/2015.

## 2016-01-05 NOTE — Telephone Encounter (Signed)
ok 

## 2016-01-05 NOTE — Telephone Encounter (Signed)
Medication called to pharmacy. 

## 2016-01-09 ENCOUNTER — Other Ambulatory Visit: Payer: Self-pay | Admitting: Family Medicine

## 2016-01-09 ENCOUNTER — Encounter: Payer: Self-pay | Admitting: Family Medicine

## 2016-01-09 NOTE — Telephone Encounter (Signed)
Medication refill for one time only.  Patient needs to be seen.  Letter sent for patient to call and schedule 

## 2016-02-14 ENCOUNTER — Other Ambulatory Visit: Payer: Self-pay | Admitting: Family Medicine

## 2016-02-14 MED ORDER — METFORMIN HCL 1000 MG PO TABS: ORAL_TABLET | ORAL | Status: DC

## 2016-02-14 NOTE — Telephone Encounter (Signed)
Medication called/sent to requested pharmacy  

## 2016-02-29 ENCOUNTER — Other Ambulatory Visit: Payer: Self-pay | Admitting: Family Medicine

## 2016-03-04 NOTE — Telephone Encounter (Signed)
ok 

## 2016-03-04 NOTE — Telephone Encounter (Signed)
Ok to refill Tramadol?? LRF - 01/05/16

## 2016-03-17 ENCOUNTER — Other Ambulatory Visit: Payer: Self-pay | Admitting: Family Medicine

## 2016-05-03 ENCOUNTER — Other Ambulatory Visit: Payer: Self-pay | Admitting: Family Medicine

## 2016-05-03 NOTE — Telephone Encounter (Signed)
ok 

## 2016-05-03 NOTE — Telephone Encounter (Signed)
Ok to refill 

## 2016-05-03 NOTE — Telephone Encounter (Signed)
Medication called to pharmacy. 

## 2016-05-21 ENCOUNTER — Other Ambulatory Visit: Payer: Self-pay | Admitting: Family Medicine

## 2016-06-12 ENCOUNTER — Other Ambulatory Visit: Payer: Self-pay | Admitting: Family Medicine

## 2016-07-11 ENCOUNTER — Other Ambulatory Visit: Payer: Self-pay | Admitting: Family Medicine

## 2016-07-11 NOTE — Telephone Encounter (Signed)
Ok to refill 

## 2016-07-11 NOTE — Telephone Encounter (Signed)
Approved. #[id number]. 

## 2016-07-11 NOTE — Telephone Encounter (Signed)
Medication called to pharmacy. 

## 2016-08-14 ENCOUNTER — Other Ambulatory Visit: Payer: Self-pay | Admitting: Family Medicine

## 2016-08-19 ENCOUNTER — Other Ambulatory Visit: Payer: Self-pay | Admitting: Family Medicine

## 2016-08-19 DIAGNOSIS — R0789 Other chest pain: Secondary | ICD-10-CM

## 2016-09-06 ENCOUNTER — Other Ambulatory Visit: Payer: Self-pay | Admitting: Family Medicine

## 2016-09-06 NOTE — Telephone Encounter (Signed)
ok 

## 2016-09-06 NOTE — Telephone Encounter (Signed)
Last OV 08-08-2015 Last refill 03-04-2016   Letter for f/u appt mailed 01-09-2016 Okay to refill?

## 2016-09-12 ENCOUNTER — Other Ambulatory Visit: Payer: Self-pay | Admitting: Physician Assistant

## 2016-09-12 NOTE — Telephone Encounter (Signed)
Ok to refill 

## 2016-09-13 NOTE — Telephone Encounter (Signed)
Rx filled

## 2016-09-13 NOTE — Telephone Encounter (Signed)
ok 

## 2016-09-13 NOTE — Telephone Encounter (Signed)
Rx phoned in.   

## 2016-09-21 ENCOUNTER — Other Ambulatory Visit: Payer: Self-pay | Admitting: Family Medicine

## 2016-09-26 ENCOUNTER — Ambulatory Visit (INDEPENDENT_AMBULATORY_CARE_PROVIDER_SITE_OTHER): Payer: Medicare Other | Admitting: Family Medicine

## 2016-09-26 ENCOUNTER — Encounter: Payer: Self-pay | Admitting: Family Medicine

## 2016-09-26 VITALS — BP 140/94 | HR 84 | Temp 98.1°F | Resp 16 | Ht 63.0 in | Wt 168.0 lb

## 2016-09-26 DIAGNOSIS — I10 Essential (primary) hypertension: Secondary | ICD-10-CM

## 2016-09-26 DIAGNOSIS — E119 Type 2 diabetes mellitus without complications: Secondary | ICD-10-CM

## 2016-09-26 DIAGNOSIS — M48061 Spinal stenosis, lumbar region without neurogenic claudication: Secondary | ICD-10-CM

## 2016-09-26 DIAGNOSIS — E7800 Pure hypercholesterolemia, unspecified: Secondary | ICD-10-CM

## 2016-09-26 LAB — COMPLETE METABOLIC PANEL WITH GFR
ALBUMIN: 3.9 g/dL (ref 3.6–5.1)
ALK PHOS: 77 U/L (ref 33–130)
ALT: 10 U/L (ref 6–29)
AST: 14 U/L (ref 10–35)
BILIRUBIN TOTAL: 0.6 mg/dL (ref 0.2–1.2)
BUN: 21 mg/dL (ref 7–25)
CALCIUM: 9.1 mg/dL (ref 8.6–10.4)
CO2: 21 mmol/L (ref 20–31)
CREATININE: 1.19 mg/dL — AB (ref 0.60–0.93)
Chloride: 102 mmol/L (ref 98–110)
GFR, Est African American: 51 mL/min — ABNORMAL LOW (ref >=60)
GFR, Est Non African American: 44 mL/min — ABNORMAL LOW (ref >=60)
GLUCOSE: 98 mg/dL (ref 70–99)
Potassium: 4.1 mmol/L (ref 3.5–5.3)
SODIUM: 138 mmol/L (ref 135–146)
TOTAL PROTEIN: 6.9 g/dL (ref 6.1–8.1)

## 2016-09-26 LAB — LIPID PANEL
CHOL/HDL RATIO: 5.3 ratio — AB (ref <5.0)
CHOLESTEROL: 230 mg/dL — AB (ref <200)
HDL: 43 mg/dL — ABNORMAL LOW (ref >50)
LDL Cholesterol: 154 mg/dL — ABNORMAL HIGH (ref <100)
Triglycerides: 167 mg/dL — ABNORMAL HIGH (ref <150)
VLDL: 33 mg/dL — ABNORMAL HIGH (ref <30)

## 2016-09-26 LAB — CBC WITH DIFFERENTIAL/PLATELET
BASOS ABS: 0 {cells}/uL (ref 0–200)
BASOS PCT: 0 %
EOS PCT: 3 %
Eosinophils Absolute: 297 cells/uL (ref 15–500)
HCT: 40.8 % (ref 35.0–45.0)
Hemoglobin: 13 g/dL (ref 12.0–15.0)
LYMPHS PCT: 24 %
Lymphs Abs: 2376 cells/uL (ref 850–3900)
MCH: 28.1 pg (ref 27.0–33.0)
MCHC: 31.9 g/dL — ABNORMAL LOW (ref 32.0–36.0)
MCV: 88.3 fL (ref 80.0–100.0)
MONOS PCT: 8 %
MPV: 10.1 fL (ref 7.5–12.5)
Monocytes Absolute: 792 cells/uL (ref 200–950)
NEUTROS ABS: 6435 {cells}/uL (ref 1500–7800)
Neutrophils Relative %: 65 %
PLATELETS: 283 10*3/uL (ref 140–400)
RBC: 4.62 MIL/uL (ref 3.80–5.10)
RDW: 16.1 % — AB (ref 11.0–15.0)
WBC: 9.9 10*3/uL (ref 3.8–10.8)

## 2016-09-26 NOTE — Progress Notes (Signed)
Subjective:    Patient ID: Claudia [name], female    DOB: 07/14/1941, 76 y.o.   MRN: [id number]  HPI 11/10/14 Patient is here today for follow-up of her medical problems. She does complain of mild pain in both knees but the pain is relieved with the tramadol and naproxen. The pain is located on the medial joint lines bilaterally. There are visible arthritic changes in the knees. She is also mildly tender to palpation over the medial joint lines bilaterally. She is not interested in a cortisone shot at the present time. She is due for the pneumonia vaccine. Diabetic eye exam is due. Diabetic foot exam is performed today. Blood pressure is excellent 130/72. She denies any chest pain shortness of breath or dyspnea on exertion. She denies any myalgias or right upper quadrant pain on the Crestor. She denies any polyuria, polydipsia, or blurred vision. She denies any numbness or tingling in the feet.  At that time, my plan was: Patient's blood pressure is excellent. I'll make no changes in her blood pressure medication. I will check a hemoglobin A1c. Goal hemoglobin A1c is less than 6.5. I will also check a urine microalbumin. I will also check a fasting lipid panel. Goal LDL cholesterol is less than 100. I recommended a diabetic eye exam and I will schedule this for the patient. Diabetic foot exam is up-to-date. Patient is also taking an aspirin. Patient received Pneumovax 23 today in clinic  05/23/15 Patient is here today for follow-up. Her fasting blood sugars are exceptionally well controlled and tend to range between 70 and 100. She is only taking metformin 500 mg by mouth twice a day. She denies any hypoglycemic episodes. She has had occasional hypoglycemic episodes into the 60s but these are asymptomatic for this individual. She denies any chest pain or shortness of breath or dyspnea on exertion. Her blood pressure today is well controlled at 132/84. She denies any myalgias or right upper quadrant pain  on her Crestor. She is due to recheck a fasting lipid panel along with her diabetic labs. She denies any neuropathy in the feet. She denies any numbness or tingling in the feet. She does continue have severe pain in her left knee secondary to osteoarthritis. She is taking naproxen twice a day and also taking tramadol in addition to that. I broached the subject of a cortisone injection in the knee with the patient does not want that today. She is very afraid of needles and refuses.  AT that time, my plan was:  blood pressure is well controlled. I will make no changes in her medications at this time. Diabetes seems well controlled. I will check a hemoglobin A1c and if less than 7, I will likely discontinue metformin given the fact she is having hypoglycemic episodes. I will also check a urine microalbumin to monitor for diabetic nephropathy. I will check a fasting lipid panel. Goal LDL cholesterol is less than 100. Diabetic foot exam is performed today and is normal. Immunizations are up-to-date. The patient is given her flu shot today.  09/26/16 Patient has not been seen since. She is overdue for an angled diabetic eye exam. Diabetic foot exam is performed today and is normal. She is due for a flu shot but she declined this. She denies any chest pain shortness of breath or dyspnea on exertion. She denies any myalgias or right upper quadrant pain. She continues to complain of pain in her lower back that is chronic due to lumbar spinal stenosis. She  denies any saddle anesthesia or other symptoms of cauda equina syndrome. She denies any hypoglycemic episodes. She states that her blood sugars typically range between 80 and 130. She denies any polyuria, polydipsia, or blurred vision. She does have a history of a retinal tear. History is limited because of her inability to speaking was. She is long overdue for annual diabetic eye exam and so I like to schedule that follow-up this issue Past Medical History:  Diagnosis  Date  . Anemia   . Arthritis   . Diabetes mellitus without complication (HCC)   . GERD (gastroesophageal reflux disease)    chronic gastritis  . Hyperlipidemia   . Hypertension    No past surgical history on file. Current Outpatient Prescriptions on File Prior to Visit  Medication Sig Dispense Refill  . aspirin 81 MG tablet Take 1 tablet (81 mg total) by mouth daily. 30 tablet 11  . BAYER CONTOUR TEST test strip TEST SUGAR ONCE A DAY 50 each 5  . CRESTOR 10 MG tablet TOME UNA TABLETA POR VIA ORAL TODOS LOS DIAS 90 tablet 1  . fluticasone (FLONASE) 50 MCG/ACT nasal spray PLACE 2 SPRAYS INTO BOTH NOSTRILS DAILY. 16 g 11  . hydrochlorothiazide (HYDRODIURIL) 25 MG tablet TOME UNA TABLETA POR VIA ORAL TODOS LOS DIAS 90 tablet 0  . loratadine (CLARITIN) 10 MG tablet Take 10 mg by mouth daily.    Marland Kitchen losartan (COZAAR) 100 MG tablet TOME UNA TABLETA TODOS LOS DIAS 90 tablet 1  . metFORMIN (GLUCOPHAGE) 1000 MG tablet TOME UNA TABLETA DOS VECES AL DIA 60 tablet 2  . metoprolol succinate (TOPROL-XL) 25 MG 24 hr tablet TOME UNA TABLETA POR VIA ORAL TODOS LOS DIAS 90 tablet 3  . NAPROXEN DR 500 MG EC tablet TAKE 1 TABLET BY MOUTH TWICE A DAY WITH A MEAL 60 tablet 5  . traMADol (ULTRAM) 50 MG tablet TOME UNA TABLETA CADA SEIS HORAS CUANDO SEA NECESARIO PARA EL DOLOR 120 tablet 0   No current facility-administered medications on file prior to visit.    No Known Allergies Social History   Social History  . Marital status: Single    Spouse name: N/A  . Number of children: N/A  . Years of education: N/A   Occupational History  . Not on file.   Social History Main Topics  . Smoking status: Never Smoker  . Smokeless tobacco: Never Used  . Alcohol use No  . Drug use: No  . Sexual activity: Not Currently   Other Topics Concern  . Not on file   Social History Narrative  . No narrative on file     Review of Systems  All other systems reviewed and are negative.      Objective:    Physical Exam  Constitutional: She appears well-developed and well-nourished.  Eyes: Conjunctivae and EOM are normal. Pupils are equal, round, and reactive to light.  Neck: Neck supple. No JVD present. No thyromegaly present.  Cardiovascular: Normal rate, regular rhythm, normal heart sounds and intact distal pulses.  Exam reveals no gallop and no friction rub.   No murmur heard. Pulmonary/Chest: Effort normal and breath sounds normal. No respiratory distress. She has no wheezes. She has no rales.  Abdominal: Soft. Bowel sounds are normal. She exhibits no distension and no mass. There is no tenderness. There is no rebound and no guarding.  Musculoskeletal: She exhibits no edema.  Lymphadenopathy:    She has no cervical adenopathy.  Vitals reviewed.  Assessment & Plan:  Controlled type 2 diabetes mellitus without complication, without long-term current use of insulin (HCC) - Plan: CBC with Differential/Platelet, Lipid panel, Microalbumin, urine, COMPLETE METABOLIC PANEL WITH GFR, Hemoglobin A1c, Ambulatory referral to Ophthalmology  Spinal stenosis of lumbar region without neurogenic claudication  Essential hypertension  Pure hypercholesterolemia For the patient's age, her blood pressures acceptable. I'll check a fasting lipid panel. Her goal LDL cholesterol is less than 100. I'll check a hemoglobin A1c. Given her age, her goal hemoglobin A1c is less than 7.0. I will also schedule the patient for an diabetic eye exam. Diabetic foot exam is performed today and is normal.

## 2016-09-27 LAB — HEMOGLOBIN A1C
HEMOGLOBIN A1C: 6.1 % — AB (ref <5.7)
Mean Plasma Glucose: 128 mg/dL

## 2016-09-27 LAB — MICROALBUMIN, URINE: MICROALB UR: 1.7 mg/dL

## 2016-10-22 ENCOUNTER — Other Ambulatory Visit: Payer: Self-pay | Admitting: Family Medicine

## 2016-11-13 ENCOUNTER — Other Ambulatory Visit: Payer: Self-pay | Admitting: Family Medicine

## 2016-11-13 NOTE — Telephone Encounter (Signed)
Last OV 2/15 Last refill 2/2 Okay to refill?

## 2016-11-14 NOTE — Telephone Encounter (Signed)
Rx called in 

## 2016-11-14 NOTE — Telephone Encounter (Signed)
ok 

## 2016-11-24 ENCOUNTER — Other Ambulatory Visit: Payer: Self-pay | Admitting: Family Medicine

## 2016-12-03 ENCOUNTER — Ambulatory Visit (INDEPENDENT_AMBULATORY_CARE_PROVIDER_SITE_OTHER): Payer: Medicare Other | Admitting: Family Medicine

## 2016-12-03 VITALS — BP 150/100 | HR 78 | Temp 98.2°F | Resp 18 | Ht 63.0 in | Wt 171.0 lb

## 2016-12-03 DIAGNOSIS — H538 Other visual disturbances: Secondary | ICD-10-CM | POA: Diagnosis not present

## 2016-12-03 DIAGNOSIS — S32050A Wedge compression fracture of fifth lumbar vertebra, initial encounter for closed fracture: Secondary | ICD-10-CM | POA: Diagnosis not present

## 2016-12-03 NOTE — Progress Notes (Signed)
Subjective:    Patient ID: Claudia [name], female    DOB: 16-Oct-1940, 76 y.o.   MRN: [id number]  HPI Larey Seat over the weekend and hurt her lower back.  Went to [facility] ER.  I have no records to review. They do have a copy of her x-rays on CD. I looked at this and she appears to have a wedge-shaped compression fracture of L5 is best I can determine. She is here today for follow-up. They have scheduled her to follow with orthopedics but she canceled this appointment and came here instead for my opinion. However she states that she is doing well. Her pain is better on naproxen. As long she takes naproxen she is able to walk and work in the yard without any difficulty. She denies any numbness or tingling in her legs or signs of cauda equina syndrome. She doesn't want surgery Past Medical History:  Diagnosis Date  . Anemia   . Arthritis   . Diabetes mellitus without complication (HCC)   . GERD (gastroesophageal reflux disease)    chronic gastritis  . Hyperlipidemia   . Hypertension    No past surgical history on file. Current Outpatient Prescriptions on File Prior to Visit  Medication Sig Dispense Refill  . aspirin 81 MG tablet Take 1 tablet (81 mg total) by mouth daily. 30 tablet 11  . BAYER CONTOUR TEST test strip TEST SUGAR ONCE A DAY 50 each 5  . fluticasone (FLONASE) 50 MCG/ACT nasal spray PLACE 2 SPRAYS INTO BOTH NOSTRILS DAILY. 16 g 11  . hydrochlorothiazide (HYDRODIURIL) 25 MG tablet TOME UNA TABLETA POR VIA ORAL TODOS LOS DIAS 90 tablet 0  . loratadine (CLARITIN) 10 MG tablet Take 10 mg by mouth daily.    Marland Kitchen losartan (COZAAR) 100 MG tablet TOME UNA TABLETA TODOS LOS DIAS 90 tablet 1  . metFORMIN (GLUCOPHAGE) 1000 MG tablet TOME UNA TABLETA DOS VECES AL DIA 60 tablet 2  . metoprolol succinate (TOPROL-XL) 25 MG 24 hr tablet TOME UNA TABLETA POR VIA ORAL TODOS LOS DIAS 90 tablet 3  . NAPROXEN DR 500 MG EC tablet TAKE 1 TABLET BY MOUTH TWICE A DAY WITH A MEAL 60 tablet 5  . rosuvastatin  (CRESTOR) 10 MG tablet TOME UNA TABLETA POR VIA ORAL TODOS LOS DIAS 90 tablet 0  . traMADol (ULTRAM) 50 MG tablet TOME UNA TABLETA CADA SEIS HORAS CUANDO SEA NECESARIO PARA EL DOLOR 120 tablet 0   No current facility-administered medications on file prior to visit.    No Known Allergies Social History   Social History  . Marital status: Single    Spouse name: N/A  . Number of children: N/A  . Years of education: N/A   Occupational History  . Not on file.   Social History Main Topics  . Smoking status: Never Smoker  . Smokeless tobacco: Never Used  . Alcohol use No  . Drug use: No  . Sexual activity: Not Currently   Other Topics Concern  . Not on file   Social History Narrative  . No narrative on file     Review of Systems  All other systems reviewed and are negative.      Objective:   Physical Exam  Constitutional: She appears well-developed and well-nourished.  Eyes: Conjunctivae and EOM are normal. Pupils are equal, round, and reactive to light.  Neck: Neck supple. No JVD present. No thyromegaly present.  Cardiovascular: Normal rate, regular rhythm, normal heart sounds and intact distal pulses.  Exam reveals  no gallop and no friction rub.   No murmur heard. Pulmonary/Chest: Effort normal and breath sounds normal. No respiratory distress. She has no wheezes. She has no rales.  Abdominal: Soft. Bowel sounds are normal. She exhibits no distension and no mass. There is no tenderness. There is no rebound and no guarding.  Musculoskeletal: She exhibits no edema.  Lymphadenopathy:    She has no cervical adenopathy.  Vitals reviewed.  TTP over lumbar spine at L5.       Assessment & Plan:  Blurry vision - Plan: Ambulatory referral to Ophthalmology  Closed compression fracture of fifth lumbar vertebra, initial encounter [facility])  Patient has what appears to be a compression fracture of L5. At the present time, her pain is well controlled and I see no emergent or  urgent indication for vertebral plasty or kyphoplasty. I will obtain records from [facility] to review to ensure that I am not missing something on my cursory read of her x-ray. Follow-up in one month to reassess pain control. At that time she is doing well, I will start her on Fosamax for prevention of future compression fracture secondary to osteoporosis. Recheck immediately if getting worse

## 2017-01-03 ENCOUNTER — Ambulatory Visit (INDEPENDENT_AMBULATORY_CARE_PROVIDER_SITE_OTHER): Payer: Medicare Other | Admitting: Family Medicine

## 2017-01-03 ENCOUNTER — Encounter: Payer: Self-pay | Admitting: Family Medicine

## 2017-01-03 VITALS — BP 156/100 | HR 86 | Temp 97.9°F | Resp 20 | Ht 63.0 in | Wt 166.0 lb

## 2017-01-03 DIAGNOSIS — S32050A Wedge compression fracture of fifth lumbar vertebra, initial encounter for closed fracture: Secondary | ICD-10-CM

## 2017-01-03 DIAGNOSIS — H538 Other visual disturbances: Secondary | ICD-10-CM | POA: Diagnosis not present

## 2017-01-03 MED ORDER — TRAMADOL HCL 50 MG PO TABS: ORAL_TABLET | ORAL | 0 refills | Status: DC

## 2017-01-03 MED ORDER — METHOCARBAMOL 500 MG PO TABS: ORAL_TABLET | ORAL | 0 refills | Status: DC

## 2017-01-03 NOTE — Progress Notes (Signed)
Subjective:    Patient ID: Claudia [name], female    DOB: 06-13-1941, 76 y.o.   MRN: [id number]  HPI 12/03/16 Larey Seat over the weekend and hurt her lower back.  Went to [facility] ER.  I have no records to review. They do have a copy of her x-rays on CD. I looked at this and she appears to have a wedge-shaped compression fracture of L5 is best I can determine. She is here today for follow-up. They have scheduled her to follow with orthopedics but she canceled this appointment and came here instead for my opinion. However she states that she is doing well. Her pain is better on naproxen. As long she takes naproxen she is able to walk and work in the yard without any difficulty. She denies any numbness or tingling in her legs or signs of cauda equina syndrome. She doesn't want surgery.  At that time, my plan was: Patient has what appears to be a compression fracture of L5. At the present time, her pain is well controlled and I see no emergent or urgent indication for vertebral plasty or kyphoplasty. I will obtain records from [facility] to review to ensure that I am not missing something on my cursory read of her x-ray. Follow-up in one month to reassess pain control. At that time she is doing well, I will start her on Fosamax for prevention of future compression fracture secondary to osteoporosis. Recheck immediately if getting worse   01/03/17 Pain in the patient's back has improved dramatically. She denies any lumbar radiculopathy, saddle anesthesia, or bowel or bladder incontinence. She is not taking any medication for osteoporosis despite having suffered a compression fracture after a fall. She is also requesting a referral to an eye doctor for eye exam. Past Medical History:  Diagnosis Date  . Anemia   . Arthritis   . Diabetes mellitus without complication (HCC)   . GERD (gastroesophageal reflux disease)    chronic gastritis  . Hyperlipidemia   . Hypertension    No past surgical history on  file. Current Outpatient Prescriptions on File Prior to Visit  Medication Sig Dispense Refill  . aspirin 81 MG tablet Take 1 tablet (81 mg total) by mouth daily. 30 tablet 11  . BAYER CONTOUR TEST test strip TEST SUGAR ONCE A DAY 50 each 5  . fluticasone (FLONASE) 50 MCG/ACT nasal spray PLACE 2 SPRAYS INTO BOTH NOSTRILS DAILY. 16 g 11  . hydrochlorothiazide (HYDRODIURIL) 25 MG tablet TOME UNA TABLETA POR VIA ORAL TODOS LOS DIAS 90 tablet 0  . loratadine (CLARITIN) 10 MG tablet Take 10 mg by mouth daily.    Marland Kitchen losartan (COZAAR) 100 MG tablet TOME UNA TABLETA TODOS LOS DIAS 90 tablet 1  . metFORMIN (GLUCOPHAGE) 1000 MG tablet TOME UNA TABLETA DOS VECES AL DIA 60 tablet 2  . metoprolol succinate (TOPROL-XL) 25 MG 24 hr tablet TOME UNA TABLETA POR VIA ORAL TODOS LOS DIAS 90 tablet 3  . naproxen (NAPRELAN) 500 MG 24 hr tablet     . NAPROXEN DR 500 MG EC tablet TAKE 1 TABLET BY MOUTH TWICE A DAY WITH A MEAL 60 tablet 5  . rosuvastatin (CRESTOR) 10 MG tablet TOME UNA TABLETA POR VIA ORAL TODOS LOS DIAS 90 tablet 0   No current facility-administered medications on file prior to visit.    No Known Allergies Social History   Social History  . Marital status: Single    Spouse name: N/A  . Number of children: N/A  .  Years of education: N/A   Occupational History  . Not on file.   Social History Main Topics  . Smoking status: Never Smoker  . Smokeless tobacco: Never Used  . Alcohol use No  . Drug use: No  . Sexual activity: Not Currently   Other Topics Concern  . Not on file   Social History Narrative  . No narrative on file     Review of Systems  All other systems reviewed and are negative.      Objective:   Physical Exam  Constitutional: She appears well-developed and well-nourished.  Eyes: Conjunctivae and EOM are normal. Pupils are equal, round, and reactive to light.  Neck: Neck supple. No JVD present. No thyromegaly present.  Cardiovascular: Normal rate, regular rhythm,  normal heart sounds and intact distal pulses.  Exam reveals no gallop and no friction rub.   No murmur heard. Pulmonary/Chest: Effort normal and breath sounds normal. No respiratory distress. She has no wheezes. She has no rales.  Abdominal: Soft. Bowel sounds are normal. She exhibits no distension and no mass. There is no tenderness. There is no rebound and no guarding.  Musculoskeletal: She exhibits no edema.  Lymphadenopathy:    She has no cervical adenopathy.  Vitals reviewed.  TTP over lumbar spine at L5.       Assessment & Plan:  Closed compression fracture of fifth lumbar vertebra, initial encounter (HCC) - Plan: DG Bone Density  Blurry vision - Plan: Ambulatory referral to Ophthalmology   Scheduled patient for a bone density test to evaluate for osteoporosis. If present, begin Fosamax. Refer the patient to an eye specialist

## 2017-01-16 ENCOUNTER — Telehealth: Payer: Self-pay | Admitting: *Deleted

## 2017-01-16 ENCOUNTER — Encounter: Payer: Self-pay | Admitting: *Deleted

## 2017-01-16 NOTE — Telephone Encounter (Signed)
Attempted to call patients daughter about referral for blurry vision. Someone answered and I asked if I could speak to Claudia LairMaria [name] and she said no and hung up. I have sent a letter to pt asking her to get daughter to call us.

## 2017-01-20 ENCOUNTER — Other Ambulatory Visit: Payer: Self-pay | Admitting: Family Medicine

## 2017-02-04 ENCOUNTER — Other Ambulatory Visit: Payer: Self-pay | Admitting: Family Medicine

## 2017-02-04 NOTE — Telephone Encounter (Signed)
Ok to refill??      LOV - 01/03/17 LRF - 01/03/17

## 2017-02-04 NOTE — Telephone Encounter (Signed)
okay

## 2017-02-19 ENCOUNTER — Telehealth: Payer: Self-pay | Admitting: Family Medicine

## 2017-02-19 NOTE — Telephone Encounter (Signed)
Requesting a refill on Tramadol - Ok to refill??      LRF 01/03/17

## 2017-02-20 MED ORDER — TRAMADOL HCL 50 MG PO TABS: ORAL_TABLET | ORAL | 0 refills | Status: DC

## 2017-02-20 NOTE — Telephone Encounter (Signed)
Medication called/sent to requested pharmacy  

## 2017-02-20 NOTE — Telephone Encounter (Signed)
ok 

## 2017-02-23 ENCOUNTER — Other Ambulatory Visit: Payer: Self-pay | Admitting: Family Medicine

## 2017-03-01 ENCOUNTER — Other Ambulatory Visit: Payer: Self-pay | Admitting: Family Medicine

## 2017-03-03 NOTE — Telephone Encounter (Signed)
ok 

## 2017-03-03 NOTE — Telephone Encounter (Signed)
Ok to refill 

## 2017-03-04 ENCOUNTER — Telehealth: Payer: Self-pay | Admitting: Family Medicine

## 2017-03-04 MED ORDER — CYCLOBENZAPRINE HCL 5 MG PO TABS: 5.0000 mg | ORAL_TABLET | Freq: Three times a day (TID) | ORAL | 2 refills | Status: DC

## 2017-03-04 NOTE — Telephone Encounter (Signed)
Flexeril 5 mg po q 8 hrs prn

## 2017-03-04 NOTE — Telephone Encounter (Signed)
Robaxin 500 mg is on back order - needs alternative

## 2017-03-04 NOTE — Telephone Encounter (Signed)
Medication called/sent to requested pharmacy  

## 2017-03-05 ENCOUNTER — Telehealth: Payer: Self-pay | Admitting: Family Medicine

## 2017-03-05 NOTE — Telephone Encounter (Signed)
PA Submitted through [email or web address] and received the following :  Your PA has been faxed to the plan as a paper copy. Please contact the plan directly if you haven't received a determination in a typical timeframe.

## 2017-03-05 NOTE — Telephone Encounter (Signed)
Approved through ins and pharm aware

## 2017-03-07 ENCOUNTER — Other Ambulatory Visit: Payer: Self-pay | Admitting: Family Medicine

## 2017-03-27 ENCOUNTER — Ambulatory Visit: Payer: Medicare Other | Admitting: Family Medicine

## 2017-04-01 ENCOUNTER — Ambulatory Visit (INDEPENDENT_AMBULATORY_CARE_PROVIDER_SITE_OTHER): Payer: Medicare Other | Admitting: Family Medicine

## 2017-04-01 VITALS — BP 146/92 | HR 80 | Temp 98.2°F | Resp 14 | Ht 63.0 in | Wt 154.0 lb

## 2017-04-01 DIAGNOSIS — Z23 Encounter for immunization: Secondary | ICD-10-CM | POA: Diagnosis not present

## 2017-04-01 DIAGNOSIS — E7800 Pure hypercholesterolemia, unspecified: Secondary | ICD-10-CM | POA: Diagnosis not present

## 2017-04-01 DIAGNOSIS — I10 Essential (primary) hypertension: Secondary | ICD-10-CM | POA: Diagnosis not present

## 2017-04-01 DIAGNOSIS — E119 Type 2 diabetes mellitus without complications: Secondary | ICD-10-CM

## 2017-04-01 DIAGNOSIS — S32050A Wedge compression fracture of fifth lumbar vertebra, initial encounter for closed fracture: Secondary | ICD-10-CM | POA: Diagnosis not present

## 2017-04-01 LAB — CBC WITH DIFFERENTIAL/PLATELET
BASOS ABS: 0 {cells}/uL (ref 0–200)
BASOS PCT: 0 %
EOS PCT: 4 %
Eosinophils Absolute: 388 cells/uL (ref 15–500)
HCT: 38.2 % (ref 35.0–45.0)
Hemoglobin: 11.9 g/dL — ABNORMAL LOW (ref 12.0–15.0)
LYMPHS PCT: 20 %
Lymphs Abs: 1940 cells/uL (ref 850–3900)
MCH: 26.2 pg — AB (ref 27.0–33.0)
MCHC: 31.2 g/dL — ABNORMAL LOW (ref 32.0–36.0)
MCV: 84 fL (ref 80.0–100.0)
MONOS PCT: 6 %
MPV: 9.5 fL (ref 7.5–12.5)
Monocytes Absolute: 582 cells/uL (ref 200–950)
NEUTROS ABS: 6790 {cells}/uL (ref 1500–7800)
Neutrophils Relative %: 70 %
PLATELETS: 277 10*3/uL (ref 140–400)
RBC: 4.55 MIL/uL (ref 3.80–5.10)
RDW: 17.5 % — ABNORMAL HIGH (ref 11.0–15.0)
WBC: 9.7 10*3/uL (ref 3.8–10.8)

## 2017-04-01 MED ORDER — TRAMADOL HCL 50 MG PO TABS: ORAL_TABLET | ORAL | 0 refills | Status: DC

## 2017-04-01 NOTE — Progress Notes (Signed)
Subjective:    Patient ID: Claudia [name], female    DOB: 1941-06-27, 76 y.o.   MRN: [id number]  Medication Refill     Patient is here for follow up.  She denies any chest pain shortness of breath or dyspnea on exertion. She denies any myalgias or right upper quadrant pain. She continues to complain of pain in her lower back that is chronic due to lumbar spinal stenosis and vertebral compression fracture.  She denies any saddle anesthesia or other symptoms of cauda equina syndrome. She denies any hypoglycemic episodes. She states that her blood sugars typically range between 80 and 130. She denies any polyuria, polydipsia, or blurred vision. She does have a history of a retinal tear.  I have consulted opthalmology.  However daughter has not made appointment. Recommended that she go ahead and make the appointment. She is also due for Prevnar 13. Her blood pressure is borderline elevated today but given her advanced age I believe is acceptable. She does request a refill on her tramadol which she uses sparingly for low back pain. Past Medical History:  Diagnosis Date  . Anemia   . Arthritis   . Diabetes mellitus without complication (HCC)   . GERD (gastroesophageal reflux disease)    chronic gastritis  . Hyperlipidemia   . Hypertension    No past surgical history on file. Current Outpatient Prescriptions on File Prior to Visit  Medication Sig Dispense Refill  . aspirin 81 MG tablet Take 1 tablet (81 mg total) by mouth daily. 30 tablet 11  . BAYER CONTOUR TEST test strip TEST SUGAR ONCE A DAY 50 each 5  . cyclobenzaprine (FLEXERIL) 5 MG tablet Take 1 tablet (5 mg total) by mouth every 8 (eight) hours. 90 tablet 2  . fluticasone (FLONASE) 50 MCG/ACT nasal spray PLACE 2 SPRAYS INTO BOTH NOSTRILS DAILY. 16 g 11  . hydrochlorothiazide (HYDRODIURIL) 25 MG tablet TOME UNA TABLETA POR VIA ORAL TODOS LOS DIAS 90 tablet 0  . loratadine (CLARITIN) 10 MG tablet Take 10 mg by mouth daily.    Marland Kitchen losartan  (COZAAR) 100 MG tablet TOME UNA TABLETA TODOS LOS DIAS 90 tablet 1  . metFORMIN (GLUCOPHAGE) 1000 MG tablet TOME UNA TABLETA DOS VECES AL DIA 60 tablet 2  . methocarbamol (ROBAXIN) 500 MG tablet TOME DOS TABLETAS CADA SEIS HORAS 100 tablet 0  . metoprolol succinate (TOPROL-XL) 25 MG 24 hr tablet TOME UNA TABLETA POR VIA ORAL TODOS LOS DIAS 90 tablet 3  . naproxen (NAPRELAN) 500 MG 24 hr tablet     . NAPROXEN DR 500 MG EC tablet TAKE 1 TABLET BY MOUTH TWICE A DAY WITH A MEAL 60 tablet 5  . rosuvastatin (CRESTOR) 10 MG tablet TOME UNA TABLETA POR VIA ORAL TODOS LOS DIAS 90 tablet 0  . traMADol (ULTRAM) 50 MG tablet TOME UNA TABLETA CADA SEIS HORAS CUANDO SEA NECESARIO PARA EL DOLOR 120 tablet 0   No current facility-administered medications on file prior to visit.    No Known Allergies Social History   Social History  . Marital status: Single    Spouse name: N/A  . Number of children: N/A  . Years of education: N/A   Occupational History  . Not on file.   Social History Main Topics  . Smoking status: Never Smoker  . Smokeless tobacco: Never Used  . Alcohol use No  . Drug use: No  . Sexual activity: Not Currently   Other Topics Concern  . Not on file  Social History Narrative  . No narrative on file     Review of Systems  All other systems reviewed and are negative.      Objective:   Physical Exam  Constitutional: She appears well-developed and well-nourished.  Eyes: Pupils are equal, round, and reactive to light. Conjunctivae and EOM are normal.  Neck: Neck supple. No JVD present. No thyromegaly present.  Cardiovascular: Normal rate, regular rhythm, normal heart sounds and intact distal pulses.  Exam reveals no gallop and no friction rub.   No murmur heard. Pulmonary/Chest: Effort normal and breath sounds normal. No respiratory distress. She has no wheezes. She has no rales.  Abdominal: Soft. Bowel sounds are normal. She exhibits no distension and no mass. There is  no tenderness. There is no rebound and no guarding.  Musculoskeletal: She exhibits no edema.  Lymphadenopathy:    She has no cervical adenopathy.  Vitals reviewed.         Assessment & Plan:  Closed compression fracture of fifth lumbar vertebra, initial encounter [facility])  Essential hypertension  Controlled type 2 diabetes mellitus without complication, without long-term current use of insulin (HCC) - Plan: CBC with Differential/Platelet, COMPLETE METABOLIC PANEL WITH GFR, Lipid panel, Microalbumin, urine, Hemoglobin A1c  Pure hypercholesterolemia  Blood pressures borderline elevated but given her advanced age, I believe is acceptable. Her reported blood sugars sound excellent. I will check a hemoglobin A1c. Goal hemoglobin A1c is less than 7. I will also check a fasting lipid panel. Goal LDL cholesterol is less than 100. I will encourage the daughter to schedule the patient for diabetic eye exam. Patient's low back pain is stable and is very minimal. I did refill her tramadol which she uses sparingly for low back pain. Await the results of her lab work. She received Prevnar 13 today.

## 2017-04-01 NOTE — Addendum Note (Signed)
Addended by: Legrand Rams B on: 04/01/2017 09:53 AM   Modules accepted: Orders

## 2017-04-02 ENCOUNTER — Encounter: Payer: Self-pay | Admitting: Family Medicine

## 2017-04-02 LAB — COMPLETE METABOLIC PANEL WITH GFR
ALT: 6 U/L (ref 6–29)
AST: 11 U/L (ref 10–35)
Albumin: 3.7 g/dL (ref 3.6–5.1)
Alkaline Phosphatase: 93 U/L (ref 33–130)
BILIRUBIN TOTAL: 0.4 mg/dL (ref 0.2–1.2)
BUN: 25 mg/dL (ref 7–25)
CO2: 18 mmol/L — AB (ref 20–32)
CREATININE: 1.2 mg/dL — AB (ref 0.60–0.93)
Calcium: 9.1 mg/dL (ref 8.6–10.4)
Chloride: 108 mmol/L (ref 98–110)
GFR, EST AFRICAN AMERICAN: 50 mL/min — AB (ref >=60)
GFR, Est Non African American: 44 mL/min — ABNORMAL LOW (ref >=60)
Glucose, Bld: 89 mg/dL (ref 70–99)
Potassium: 4.7 mmol/L (ref 3.5–5.3)
Sodium: 140 mmol/L (ref 135–146)
TOTAL PROTEIN: 6.4 g/dL (ref 6.1–8.1)

## 2017-04-02 LAB — MICROALBUMIN, URINE: Microalb, Ur: 5 mg/dL

## 2017-04-02 LAB — LIPID PANEL
Cholesterol: 133 mg/dL (ref <200)
HDL: 39 mg/dL — ABNORMAL LOW (ref >50)
LDL CALC: 72 mg/dL (ref <100)
TRIGLYCERIDES: 108 mg/dL (ref <150)
Total CHOL/HDL Ratio: 3.4 Ratio (ref <5.0)
VLDL: 22 mg/dL (ref <30)

## 2017-04-02 LAB — HEMOGLOBIN A1C
HEMOGLOBIN A1C: 5.8 % — AB (ref <5.7)
MEAN PLASMA GLUCOSE: 120 mg/dL

## 2017-05-12 ENCOUNTER — Other Ambulatory Visit: Payer: Self-pay | Admitting: Family Medicine

## 2017-05-13 NOTE — Telephone Encounter (Signed)
Last OV 5/25 Last refill 8/21 Ok to refill Tramadol?

## 2017-05-13 NOTE — Telephone Encounter (Signed)
ok 

## 2017-05-14 NOTE — Telephone Encounter (Signed)
rx called into pharmacy

## 2017-05-17 ENCOUNTER — Other Ambulatory Visit: Payer: Self-pay | Admitting: Family Medicine

## 2017-05-27 DIAGNOSIS — H2701 Aphakia, right eye: Secondary | ICD-10-CM | POA: Diagnosis not present

## 2017-05-27 DIAGNOSIS — H25812 Combined forms of age-related cataract, left eye: Secondary | ICD-10-CM | POA: Diagnosis not present

## 2017-05-27 DIAGNOSIS — H40013 Open angle with borderline findings, low risk, bilateral: Secondary | ICD-10-CM | POA: Diagnosis not present

## 2017-05-27 DIAGNOSIS — E119 Type 2 diabetes mellitus without complications: Secondary | ICD-10-CM | POA: Diagnosis not present

## 2017-05-27 LAB — HM DIABETES EYE EXAM

## 2017-06-15 ENCOUNTER — Other Ambulatory Visit: Payer: Self-pay | Admitting: Family Medicine

## 2017-06-21 ENCOUNTER — Other Ambulatory Visit: Payer: Self-pay | Admitting: Family Medicine

## 2017-07-23 ENCOUNTER — Other Ambulatory Visit: Payer: Self-pay | Admitting: Family Medicine

## 2017-07-23 DIAGNOSIS — R0789 Other chest pain: Secondary | ICD-10-CM

## 2017-08-10 ENCOUNTER — Other Ambulatory Visit: Payer: Self-pay | Admitting: Family Medicine

## 2017-08-20 ENCOUNTER — Other Ambulatory Visit: Payer: Self-pay | Admitting: Family Medicine

## 2017-08-20 MED ORDER — GLUCOSE BLOOD VI STRP: ORAL_STRIP | 5 refills | Status: DC

## 2017-08-21 ENCOUNTER — Other Ambulatory Visit: Payer: Self-pay | Admitting: Family Medicine

## 2017-08-22 ENCOUNTER — Telehealth: Payer: Self-pay | Admitting: Family Medicine

## 2017-08-22 MED ORDER — GLUCOSE BLOOD VI STRP: ORAL_STRIP | 5 refills | Status: DC

## 2017-08-22 MED ORDER — METHOCARBAMOL 500 MG PO TABS: ORAL_TABLET | ORAL | 0 refills | Status: DC

## 2017-08-22 NOTE — Telephone Encounter (Signed)
Requesting refill   Methocarbamol  LOV: 04/01/17  LRF:  03/03/17

## 2017-08-22 NOTE — Telephone Encounter (Signed)
Approved.  

## 2017-08-27 ENCOUNTER — Telehealth: Payer: Self-pay | Admitting: Family Medicine

## 2017-08-27 NOTE — Telephone Encounter (Signed)
Pharmacy sent fax stating that Tizanidine is preferred over Robaxin - Ok to switch?  [facility] [address]

## 2017-08-28 MED ORDER — TIZANIDINE HCL 4 MG PO TABS: 4.0000 mg | ORAL_TABLET | Freq: Three times a day (TID) | ORAL | 0 refills | Status: DC

## 2017-08-28 NOTE — Telephone Encounter (Signed)
Ok, 4 mg po q 8 hrs prn (30)

## 2017-08-28 NOTE — Addendum Note (Signed)
Addended by: Legrand RamsWILLIS, SANDY B on: 08/28/2017 09:19 AM   Modules accepted: Orders

## 2017-08-28 NOTE — Telephone Encounter (Signed)
Medication called/sent to requested pharmacy  

## 2017-09-21 ENCOUNTER — Other Ambulatory Visit: Payer: Self-pay | Admitting: Family Medicine

## 2017-10-21 ENCOUNTER — Other Ambulatory Visit: Payer: Self-pay | Admitting: Family Medicine

## 2017-10-21 NOTE — Telephone Encounter (Signed)
Ok to refill??  Last office visit 04/01/2017.  Last refill on Tramadol 05/13/2017.  Last refill on Zanaflex 08/28/2017.

## 2017-11-11 ENCOUNTER — Other Ambulatory Visit: Payer: Self-pay | Admitting: Family Medicine

## 2017-11-11 MED ORDER — GLUCOSE BLOOD VI STRP: ORAL_STRIP | 5 refills | Status: DC

## 2017-11-24 ENCOUNTER — Other Ambulatory Visit: Payer: Self-pay | Admitting: Family Medicine

## 2017-11-24 NOTE — Telephone Encounter (Signed)
Pt is requesting refill on Xanax   LOV: 04/01/17  LRF:   10/23/17

## 2017-12-20 ENCOUNTER — Other Ambulatory Visit: Payer: Self-pay | Admitting: Family Medicine

## 2018-01-01 ENCOUNTER — Other Ambulatory Visit: Payer: Self-pay | Admitting: Family Medicine

## 2018-01-02 NOTE — Telephone Encounter (Signed)
Last OV 04/01/2017 Last refill 10/23/2017 Ok to refill?

## 2018-01-19 ENCOUNTER — Other Ambulatory Visit: Payer: Self-pay | Admitting: Family Medicine

## 2018-02-08 ENCOUNTER — Other Ambulatory Visit: Payer: Self-pay | Admitting: Family Medicine

## 2018-02-11 ENCOUNTER — Other Ambulatory Visit: Payer: Self-pay | Admitting: Family Medicine

## 2018-02-11 NOTE — Telephone Encounter (Signed)
Ok to refill 

## 2018-02-13 NOTE — Telephone Encounter (Signed)
I am ok with refill but does she still need muscle relaxer for muscle pain in her back.  I would try stopping due to medication side effects (dizziness, sleepiness, constipation).  If they feel she still requires, I am ok with refill.

## 2018-02-16 NOTE — Telephone Encounter (Signed)
Call placed to patient.   Was advised that phone line can't take calls at this time.   Will refuse refill with advice to contact provider.

## 2018-02-16 NOTE — Telephone Encounter (Signed)
Call placed to patient to inquire. Line noted busy.

## 2018-02-28 ENCOUNTER — Other Ambulatory Visit: Payer: Self-pay | Admitting: Family Medicine

## 2018-03-02 NOTE — Telephone Encounter (Signed)
Ok to refill Tramadol??  Last office visit 04/01/2017.  Last refill 01/02/2018.

## 2018-03-23 ENCOUNTER — Other Ambulatory Visit: Payer: Self-pay | Admitting: Family Medicine

## 2018-04-02 ENCOUNTER — Other Ambulatory Visit: Payer: Self-pay | Admitting: Family Medicine

## 2018-04-17 ENCOUNTER — Other Ambulatory Visit: Payer: Self-pay | Admitting: Family Medicine

## 2018-04-26 ENCOUNTER — Other Ambulatory Visit: Payer: Self-pay | Admitting: Family Medicine

## 2018-04-27 NOTE — Telephone Encounter (Signed)
Ok to refill??  Last office visit 04/01/2017.  Last refill 03/02/2018.

## 2018-04-30 ENCOUNTER — Ambulatory Visit: Payer: Medicare Other | Admitting: Family Medicine

## 2018-05-07 ENCOUNTER — Ambulatory Visit (INDEPENDENT_AMBULATORY_CARE_PROVIDER_SITE_OTHER): Payer: Medicare Other | Admitting: Family Medicine

## 2018-05-07 ENCOUNTER — Encounter: Payer: Self-pay | Admitting: Family Medicine

## 2018-05-07 VITALS — BP 154/110 | HR 66 | Temp 97.6°F | Resp 16 | Ht 63.0 in | Wt 160.0 lb

## 2018-05-07 DIAGNOSIS — I10 Essential (primary) hypertension: Secondary | ICD-10-CM | POA: Diagnosis not present

## 2018-05-07 DIAGNOSIS — E119 Type 2 diabetes mellitus without complications: Secondary | ICD-10-CM

## 2018-05-07 DIAGNOSIS — E7800 Pure hypercholesterolemia, unspecified: Secondary | ICD-10-CM

## 2018-05-07 MED ORDER — HYDROCHLOROTHIAZIDE 25 MG PO TABS: ORAL_TABLET | ORAL | 0 refills | Status: DC

## 2018-05-07 NOTE — Progress Notes (Signed)
Subjective:    Patient ID: Claudia [name], female    DOB: 12/23/1940, 77 y.o.   MRN: [id number]  Medication Refill    Patient has not been seen in over a year.  Her blood pressure today is elevated 154/110.  Communication poses an issue however she is adamant that she is taking losartan as well as metoprolol.  Hydrochlorothiazide is on her medication list however the patient states she is not taking that.  There is no hydrochlorothiazide in her pillbox.  Therefore it appears that she ran out of this medication at some point in the past.  She denies any chest pain shortness of breath or dyspnea on exertion.  She refuses her flu shot.  She is not checking her blood sugars but she denies any polyuria, polydipsia, or blurry vision.  She is overdue for fasting lab work.  She does complain of bilateral knee pain and crepitus despite taking naproxen twice a day.  We discussed the possibility of cortisone injections in the knee but the patient declined that.  The pain is not bothering her bad enough that she requests a shot.  Past Medical History:  Diagnosis Date  . Anemia   . Arthritis   . Diabetes mellitus without complication (HCC)   . GERD (gastroesophageal reflux disease)    chronic gastritis  . Hyperlipidemia   . Hypertension    No past surgical history on file. Current Outpatient Medications on File Prior to Visit  Medication Sig Dispense Refill  . aspirin 81 MG tablet Take 1 tablet (81 mg total) by mouth daily. 30 tablet 11  . cyclobenzaprine (FLEXERIL) 5 MG tablet Take 1 tablet (5 mg total) by mouth every 8 (eight) hours. 90 tablet 2  . fluticasone (FLONASE) 50 MCG/ACT nasal spray PLACE 2 SPRAYS INTO BOTH NOSTRILS DAILY. 16 g 11  . glucose blood (BAYER CONTOUR TEST) test strip Check BS QAM fasting 50 each 5  . hydrochlorothiazide (HYDRODIURIL) 25 MG tablet TOME UNA TABLETA POR VIA ORAL TODOS LOS DIAS 90 tablet 0  . loratadine (CLARITIN) 10 MG tablet Take 10 mg by mouth daily.    Marland Kitchen  losartan (COZAAR) 100 MG tablet TOME UNA TABLETA TODOS LOS DIAS 90 tablet 1  . metFORMIN (GLUCOPHAGE) 1000 MG tablet TOME UNA TABLETA DOS VECES AL DIA 60 tablet 0  . metFORMIN (GLUCOPHAGE) 1000 MG tablet TOME UNA TABLETA DOS VECES AL DIA 180 tablet 1  . metoprolol succinate (TOPROL-XL) 25 MG 24 hr tablet TOME UNA TABLETA POR VIA ORAL TODOS LOS DIAS 90 tablet 3  . naproxen (NAPRELAN) 500 MG 24 hr tablet     . naproxen (NAPRELAN) 500 MG 24 hr tablet TAKE 1 TABLET BY MOUTH TWICE A DAY WITH A MEAL 60 tablet 5  . rosuvastatin (CRESTOR) 10 MG tablet TOME UNA TABLETA POR VIA ORAL TODOS LOS DIAS 90 tablet 0  . tiZANidine (ZANAFLEX) 4 MG tablet TOME UNA TABLETA CADA OCHO HORAS 90 tablet 0  . traMADol (ULTRAM) 50 MG tablet TOME UNA TABLETA POR VIA ORAL CADA SEIS HORAS CUANDO SEA NECESARIO PARA EL DOLOR 120 tablet 0   No current facility-administered medications on file prior to visit.    No Known Allergies Social History   Socioeconomic History  . Marital status: Single    Spouse name: Not on file  . Number of children: Not on file  . Years of education: Not on file  . Highest education level: Not on file  Occupational History  . Not on file  Social Needs  . Financial resource strain: Not on file  . Food insecurity:    Worry: Not on file    Inability: Not on file  . Transportation needs:    Medical: Not on file    Non-medical: Not on file  Tobacco Use  . Smoking status: Never Smoker  . Smokeless tobacco: Never Used  Substance and Sexual Activity  . Alcohol use: No  . Drug use: No  . Sexual activity: Not Currently  Lifestyle  . Physical activity:    Days per week: Not on file    Minutes per session: Not on file  . Stress: Not on file  Relationships  . Social connections:    Talks on phone: Not on file    Gets together: Not on file    Attends religious service: Not on file    Active member of club or organization: Not on file    Attends meetings of clubs or organizations: Not on  file    Relationship status: Not on file  . Intimate partner violence:    Fear of current or ex partner: Not on file    Emotionally abused: Not on file    Physically abused: Not on file    Forced sexual activity: Not on file  Other Topics Concern  . Not on file  Social History Narrative  . Not on file     Review of Systems  All other systems reviewed and are negative.      Objective:   Physical Exam  Constitutional: She appears well-developed and well-nourished.  Eyes: Pupils are equal, round, and reactive to light. Conjunctivae and EOM are normal.  Neck: Neck supple. No JVD present. No thyromegaly present.  Cardiovascular: Normal rate, regular rhythm, normal heart sounds and intact distal pulses. Exam reveals no gallop and no friction rub.  No murmur heard. Pulmonary/Chest: Effort normal and breath sounds normal. No respiratory distress. She has no wheezes. She has no rales.  Abdominal: Soft. Bowel sounds are normal. She exhibits no distension and no mass. There is no tenderness. There is no rebound and no guarding.  Musculoskeletal: She exhibits no edema.  Lymphadenopathy:    She has no cervical adenopathy.  Vitals reviewed.         Assessment & Plan:  Essential hypertension  Controlled type 2 diabetes mellitus without complication, without long-term current use of insulin (HCC) - Plan: CBC with Differential/Platelet, COMPLETE METABOLIC PANEL WITH GFR, Lipid panel, Microalbumin, urine, Hemoglobin A1c  Pure hypercholesterolemia  I am concerned by her blood pressure.  I recommended resuming hydrochlorothiazide 25 mg a day in addition to her losartan and metoprolol and then rechecking her blood pressure in 2 weeks.  Offered a flu shot but she declined.  I will check a CBC, CMP, fasting lipid panel, hemoglobin A1c, and a urine microalbumin.  Goal hemoglobin A1c for this patient will be less than 7 given her age.  Goal LDL cholesterol is less than 100.

## 2018-05-08 LAB — CBC WITH DIFFERENTIAL/PLATELET
BASOS PCT: 0.6 %
Basophils Absolute: 53 cells/uL (ref 0–200)
Eosinophils Absolute: 169 cells/uL (ref 15–500)
Eosinophils Relative: 1.9 %
HEMATOCRIT: 37.2 % (ref 35.0–45.0)
Hemoglobin: 11.7 g/dL (ref 11.7–15.5)
LYMPHS ABS: 1451 {cells}/uL (ref 850–3900)
MCH: 24.5 pg — ABNORMAL LOW (ref 27.0–33.0)
MCHC: 31.5 g/dL — ABNORMAL LOW (ref 32.0–36.0)
MCV: 77.8 fL — AB (ref 80.0–100.0)
MPV: 11 fL (ref 7.5–12.5)
Monocytes Relative: 6.7 %
NEUTROS ABS: 6631 {cells}/uL (ref 1500–7800)
Neutrophils Relative %: 74.5 %
PLATELETS: 265 10*3/uL (ref 140–400)
RBC: 4.78 10*6/uL (ref 3.80–5.10)
RDW: 16.3 % — AB (ref 11.0–15.0)
Total Lymphocyte: 16.3 %
WBC mixed population: 596 cells/uL (ref 200–950)
WBC: 8.9 10*3/uL (ref 3.8–10.8)

## 2018-05-08 LAB — LIPID PANEL
CHOL/HDL RATIO: 3 (calc) (ref <5.0)
CHOLESTEROL: 138 mg/dL (ref <200)
HDL: 46 mg/dL — ABNORMAL LOW (ref >50)
LDL Cholesterol (Calc): 72 mg/dL (calc)
Non-HDL Cholesterol (Calc): 92 mg/dL (calc) (ref <130)
Triglycerides: 116 mg/dL (ref <150)

## 2018-05-08 LAB — COMPLETE METABOLIC PANEL WITH GFR
AG Ratio: 1.8 (calc) (ref 1.0–2.5)
ALBUMIN MSPROF: 4.2 g/dL (ref 3.6–5.1)
ALKALINE PHOSPHATASE (APISO): 66 U/L (ref 33–130)
ALT: 9 U/L (ref 6–29)
AST: 17 U/L (ref 10–35)
BILIRUBIN TOTAL: 0.5 mg/dL (ref 0.2–1.2)
BUN / CREAT RATIO: 23 (calc) — AB (ref 6–22)
BUN: 24 mg/dL (ref 7–25)
CHLORIDE: 107 mmol/L (ref 98–110)
CO2: 25 mmol/L (ref 20–32)
CREATININE: 1.05 mg/dL — AB (ref 0.60–0.93)
Calcium: 9.5 mg/dL (ref 8.6–10.4)
GFR, Est African American: 59 mL/min/{1.73_m2} — ABNORMAL LOW (ref >=60)
GFR, Est Non African American: 51 mL/min/{1.73_m2} — ABNORMAL LOW (ref >=60)
GLOBULIN: 2.3 g/dL (ref 1.9–3.7)
Glucose, Bld: 112 mg/dL — ABNORMAL HIGH (ref 65–99)
POTASSIUM: 4.6 mmol/L (ref 3.5–5.3)
SODIUM: 141 mmol/L (ref 135–146)
Total Protein: 6.5 g/dL (ref 6.1–8.1)

## 2018-05-08 LAB — MICROALBUMIN, URINE: Microalb, Ur: 6 mg/dL

## 2018-05-08 LAB — HEMOGLOBIN A1C
HEMOGLOBIN A1C: 6.4 %{Hb} — AB (ref <5.7)
Mean Plasma Glucose: 137 (calc)
eAG (mmol/L): 7.6 (calc)

## 2018-05-21 ENCOUNTER — Encounter: Payer: Self-pay | Admitting: Family Medicine

## 2018-05-21 ENCOUNTER — Ambulatory Visit (INDEPENDENT_AMBULATORY_CARE_PROVIDER_SITE_OTHER): Payer: Medicare Other | Admitting: Family Medicine

## 2018-05-21 VITALS — BP 114/60 | HR 68 | Temp 97.9°F | Resp 18 | Ht 63.0 in | Wt 156.0 lb

## 2018-05-21 DIAGNOSIS — R718 Other abnormality of red blood cells: Secondary | ICD-10-CM

## 2018-05-21 DIAGNOSIS — R6889 Other general symptoms and signs: Secondary | ICD-10-CM | POA: Diagnosis not present

## 2018-05-21 DIAGNOSIS — I10 Essential (primary) hypertension: Secondary | ICD-10-CM

## 2018-05-21 DIAGNOSIS — D539 Nutritional anemia, unspecified: Secondary | ICD-10-CM | POA: Diagnosis not present

## 2018-05-21 NOTE — Progress Notes (Signed)
Subjective:    Patient ID: Claudia Claudia [name], female    DOB: [birth year], 77 y.o.   MRN: [id number]  Medication Refill   05/07/18 Patient has not been seen in over a year.  Her Claudia [name] pressure today is elevated 154/110.  Communication poses an issue however she is adamant that she is taking losartan as well as metoprolol.  Hydrochlorothiazide is on her medication list however the patient states she is not taking that.  There is no hydrochlorothiazide in her pillbox.  Therefore it appears that she ran out of this medication at some point in the past.  She denies any chest pain shortness of breath or dyspnea on exertion.  She refuses her flu shot.  She is not checking her Claudia [name] sugars but she denies any polyuria, polydipsia, or blurry vision.  She is overdue for fasting lab work.  She does complain of bilateral knee pain and crepitus despite taking naproxen twice a day.  We discussed the possibility of cortisone injections in the knee but the patient declined that.  The pain is not bothering her bad enough that she requests a shot.  At that time, my plan was: I am concerned by her Claudia [name] pressure.  I recommended resuming hydrochlorothiazide 25 mg a day in addition to her losartan and metoprolol and then rechecking her Claudia [name] pressure in 2 weeks.  Offered a flu shot but she declined.  I will check a CBC, CMP, fasting lipid panel, hemoglobin A1c, and a urine microalbumin.  Goal hemoglobin A1c for this patient will be less than 7 given her age.  Goal LDL cholesterol is less than 100.  05/21/18  Patient's hemoglobin A1c was excellent at 6.4.  However she was found to be mildly anemic and also to have microcytic anemia.  She is here today to get a stool kit to check for occult Claudia [name] as well as to check an iron level.  I started her on hydrochlorothiazide for hypertension because her Claudia [name] pressure was so high last week.  However today her Claudia [name] pressure is extremely low.  I checked it in her left arm and found to be  114/60.  I checked in her right arm and found to be 110/62.  She feels extremely dizzy and weak and tired since starting the medication.  She also reports constipation.  She states that she has not had a bowel movement in over 3 days.  She often has to strain to have a bowel movement.  This was ongoing before she started hydrochlorothiazide.  The melena or hematochezia or abdominal discomfort Past Medical History:  Diagnosis Date  . Anemia   . Arthritis   . Diabetes mellitus without complication ([facility])   . GERD (gastroesophageal reflux disease)    chronic gastritis  . Hyperlipidemia   . Hypertension    No past surgical history on file. Current Outpatient Medications on File Prior to Visit  Medication Sig Dispense Refill  . aspirin 81 MG tablet Take 1 tablet (81 mg total) by mouth daily. 30 tablet 11  . cyclobenzaprine (FLEXERIL) 5 MG tablet Take 1 tablet (5 mg total) by mouth every 8 (eight) hours. 90 tablet 2  . fluticasone (FLONASE) 50 MCG/ACT nasal spray PLACE 2 SPRAYS INTO BOTH NOSTRILS DAILY. 16 g 11  . glucose Claudia [name] (BAYER CONTOUR TEST) test strip Check BS QAM fasting 50 each 5  . hydrochlorothiazide (HYDRODIURIL) 25 MG tablet TOME UNA TABLETA POR VIA ORAL TODOS LOS DIAS 90 tablet 0  . loratadine (CLARITIN) 10 MG  tablet Take 10 mg by mouth daily.    Marland Kitchen losartan (COZAAR) 100 MG tablet TOME UNA TABLETA TODOS LOS DIAS 90 tablet 1  . metFORMIN (GLUCOPHAGE) 1000 MG tablet TOME UNA TABLETA DOS VECES AL DIA 60 tablet 0  . metoprolol succinate (TOPROL-XL) 25 MG 24 hr tablet TOME UNA TABLETA POR VIA ORAL TODOS LOS DIAS 90 tablet 3  . naproxen (NAPRELAN) 500 MG 24 hr tablet     . naproxen (NAPRELAN) 500 MG 24 hr tablet TAKE 1 TABLET BY MOUTH TWICE A DAY WITH A MEAL 60 tablet 5  . rosuvastatin (CRESTOR) 10 MG tablet TOME UNA TABLETA POR VIA ORAL TODOS LOS DIAS 90 tablet 0  . tiZANidine (ZANAFLEX) 4 MG tablet TOME UNA TABLETA CADA OCHO HORAS 90 tablet 0  . traMADol (ULTRAM) 50 MG tablet TOME UNA  TABLETA POR VIA ORAL CADA SEIS HORAS CUANDO SEA NECESARIO PARA EL DOLOR 120 tablet 0   No current facility-administered medications on file prior to visit.    No Known Allergies Social History   Socioeconomic History  . Marital status: Single    Spouse name: Not on file  . Number of children: Not on file  . Years of education: Not on file  . Highest education level: Not on file  Occupational History  . Not on file  Social Needs  . Financial resource strain: Not on file  . Food insecurity:    Worry: Not on file    Inability: Not on file  . Transportation needs:    Medical: Not on file    Non-medical: Not on file  Tobacco Use  . Smoking status: Never Smoker  . Smokeless tobacco: Never Used  Substance and Sexual Activity  . Alcohol use: No  . Drug use: No  . Sexual activity: Not Currently  Lifestyle  . Physical activity:    Days per week: Not on file    Minutes per session: Not on file  . Stress: Not on file  Relationships  . Social connections:    Talks on phone: Not on file    Gets together: Not on file    Attends religious service: Not on file    Active member of club or organization: Not on file    Attends meetings of clubs or organizations: Not on file    Relationship status: Not on file  . Intimate partner violence:    Fear of current or ex partner: Not on file    Emotionally abused: Not on file    Physically abused: Not on file    Forced sexual activity: Not on file  Other Topics Concern  . Not on file  Social History Narrative  . Not on file     Review of Systems  All other systems reviewed and are negative.      Objective:   Physical Exam  Constitutional: She appears well-developed and well-nourished.  Eyes: Pupils are equal, round, and reactive to light. Conjunctivae and EOM are normal.  Neck: Neck supple. No JVD present. No thyromegaly present.  Cardiovascular: Normal rate, regular rhythm, normal heart sounds and intact distal pulses. Exam  reveals no gallop and no friction rub.  No murmur heard. Pulmonary/Chest: Effort normal and breath sounds normal. No respiratory distress. She has no wheezes. She has no rales.  Abdominal: Soft. Bowel sounds are normal. She exhibits no distension and no mass. There is no tenderness. There is no rebound and no guarding.  Musculoskeletal: She exhibits no edema.  Lymphadenopathy:  She has no cervical adenopathy.  Vitals reviewed.         Assessment & Plan:  Essential hypertension  RBC microcytosis - Plan: Anemia panel, Fecal Globin By Immunochemistry  Because of the microcytic anemia, I will check an iron level.  I will also check a stool test for Claudia [name].  Her Claudia [name] pressure today is low particular for her age.  She appears overmedicated.  Discontinue the hydrochlorothiazide as started at her last office visit and come by and let us recheck the Claudia [name] pressure off the medication in 2 weeks.  If elevated again, maybe we could start her on half a pill a day.  I also gave her samples of Linzess 74 mcg daily for chronic constipation.

## 2018-05-22 LAB — ANEMIA PROFILE B
%SAT: 20 % (calc) (ref 16–45)
ABS Retic: 46440 cells/uL (ref 20000–8000)
BASOS PCT: 0.7 %
Basophils Absolute: 57 cells/uL (ref 0–200)
Eosinophils Absolute: 243 cells/uL (ref 15–500)
Eosinophils Relative: 3 %
Ferritin: 14 ng/mL — ABNORMAL LOW (ref 16–288)
Folate: 18.2 ng/mL
HCT: 39.3 % (ref 35.0–45.0)
HEMOGLOBIN: 12.3 g/dL (ref 11.7–15.5)
IRON: 72 ug/dL (ref 45–160)
Lymphs Abs: 1968 cells/uL (ref 850–3900)
MCH: 23.8 pg — ABNORMAL LOW (ref 27.0–33.0)
MCHC: 31.3 g/dL — AB (ref 32.0–36.0)
MCV: 76.2 fL — ABNORMAL LOW (ref 80.0–100.0)
MPV: 11.2 fL (ref 7.5–12.5)
Monocytes Relative: 8.3 %
NEUTROS ABS: 5160 {cells}/uL (ref 1500–7800)
NEUTROS PCT: 63.7 %
Platelets: 271 10*3/uL (ref 140–400)
RBC: 5.16 10*6/uL — AB (ref 3.80–5.10)
RDW: 16.6 % — AB (ref 11.0–15.0)
Retic Ct Pct: 0.9 %
TIBC: 369 mcg/dL (calc) (ref 250–450)
TOTAL LYMPHOCYTE: 24.3 %
Vitamin B-12: 197 pg/mL — ABNORMAL LOW (ref 200–1100)
WBC mixed population: 672 cells/uL (ref 200–950)
WBC: 8.1 10*3/uL (ref 3.8–10.8)

## 2018-05-27 ENCOUNTER — Encounter: Payer: Self-pay | Admitting: Family Medicine

## 2018-06-15 ENCOUNTER — Other Ambulatory Visit: Payer: Self-pay | Admitting: Family Medicine

## 2018-06-18 ENCOUNTER — Other Ambulatory Visit: Payer: Self-pay | Admitting: Family Medicine

## 2018-06-18 NOTE — Telephone Encounter (Signed)
Requesting refill    Tramadol  LOV:  05/21/18  LRF:  04/27/18

## 2018-07-21 ENCOUNTER — Other Ambulatory Visit: Payer: Self-pay | Admitting: Family Medicine

## 2018-07-21 DIAGNOSIS — R0789 Other chest pain: Secondary | ICD-10-CM

## 2018-07-22 ENCOUNTER — Other Ambulatory Visit: Payer: Self-pay | Admitting: Family Medicine

## 2018-07-28 ENCOUNTER — Other Ambulatory Visit: Payer: Self-pay | Admitting: Family Medicine

## 2018-08-13 ENCOUNTER — Other Ambulatory Visit: Payer: Self-pay | Admitting: Family Medicine

## 2018-08-13 NOTE — Telephone Encounter (Signed)
Ok to refill??  Last office visit 05/21/2018.  Last refill 06/18/2018.

## 2018-08-31 ENCOUNTER — Telehealth: Payer: Self-pay | Admitting: Family Medicine

## 2018-08-31 NOTE — Telephone Encounter (Signed)
Patient walked in the clinic today with granddaughter with c/o right eye pain, and swelling. Patient states that she felt something pop in her eye after a fall and has since had the pain, swelling, and discharge. She has a hx of a detached retina that required surgery years ago in [place]. She has also c/o nose bleeding since the fall but bleeding has stopped. I instructed the patient to go to an Ophthalmologist and she stated that she would need a referral and she requested to have you look at her eye before she goes to see a specialist. I scheduled her for tomorrow. She would also like to know if you could call her in and additional pain medication for the severe eye pain. Please advise?

## 2018-08-31 NOTE — Telephone Encounter (Signed)
Disregard instructed patient to contact ophthalmologist immediately or go to the ER today for possible Detached retina. Appointment was canceled

## 2018-09-01 ENCOUNTER — Ambulatory Visit: Payer: Self-pay | Admitting: Family Medicine

## 2018-09-01 DIAGNOSIS — H25812 Combined forms of age-related cataract, left eye: Secondary | ICD-10-CM | POA: Diagnosis not present

## 2018-09-01 DIAGNOSIS — E119 Type 2 diabetes mellitus without complications: Secondary | ICD-10-CM | POA: Diagnosis not present

## 2018-09-01 DIAGNOSIS — H2701 Aphakia, right eye: Secondary | ICD-10-CM | POA: Diagnosis not present

## 2018-09-01 DIAGNOSIS — S0501XA Injury of conjunctiva and corneal abrasion without foreign body, right eye, initial encounter: Secondary | ICD-10-CM | POA: Diagnosis not present

## 2018-09-01 DIAGNOSIS — H16141 Punctate keratitis, right eye: Secondary | ICD-10-CM | POA: Diagnosis not present

## 2018-09-01 DIAGNOSIS — H40013 Open angle with borderline findings, low risk, bilateral: Secondary | ICD-10-CM | POA: Diagnosis not present

## 2018-09-02 DIAGNOSIS — H01024 Squamous blepharitis left upper eyelid: Secondary | ICD-10-CM | POA: Diagnosis not present

## 2018-09-02 DIAGNOSIS — H01025 Squamous blepharitis left lower eyelid: Secondary | ICD-10-CM | POA: Diagnosis not present

## 2018-09-02 DIAGNOSIS — H01022 Squamous blepharitis right lower eyelid: Secondary | ICD-10-CM | POA: Diagnosis not present

## 2018-09-02 DIAGNOSIS — H01021 Squamous blepharitis right upper eyelid: Secondary | ICD-10-CM | POA: Diagnosis not present

## 2018-09-02 DIAGNOSIS — H1811 Bullous keratopathy, right eye: Secondary | ICD-10-CM | POA: Diagnosis not present

## 2018-09-02 DIAGNOSIS — H04123 Dry eye syndrome of bilateral lacrimal glands: Secondary | ICD-10-CM | POA: Diagnosis not present

## 2018-09-09 ENCOUNTER — Other Ambulatory Visit: Payer: Self-pay | Admitting: Family Medicine

## 2018-09-09 DIAGNOSIS — H0102A Squamous blepharitis right eye, upper and lower eyelids: Secondary | ICD-10-CM | POA: Diagnosis not present

## 2018-09-09 DIAGNOSIS — H04123 Dry eye syndrome of bilateral lacrimal glands: Secondary | ICD-10-CM | POA: Diagnosis not present

## 2018-09-09 DIAGNOSIS — H2512 Age-related nuclear cataract, left eye: Secondary | ICD-10-CM | POA: Diagnosis not present

## 2018-09-09 DIAGNOSIS — H0102B Squamous blepharitis left eye, upper and lower eyelids: Secondary | ICD-10-CM | POA: Diagnosis not present

## 2018-09-09 DIAGNOSIS — H169 Unspecified keratitis: Secondary | ICD-10-CM | POA: Diagnosis not present

## 2018-09-09 DIAGNOSIS — H1811 Bullous keratopathy, right eye: Secondary | ICD-10-CM | POA: Diagnosis not present

## 2018-10-07 ENCOUNTER — Other Ambulatory Visit: Payer: Self-pay | Admitting: Family Medicine

## 2018-10-07 NOTE — Telephone Encounter (Signed)
Ok to refill??  Last office visit 05/21/2018.  Last refill 08/13/2018.

## 2018-11-24 ENCOUNTER — Other Ambulatory Visit: Payer: Self-pay | Admitting: Family Medicine

## 2018-11-24 NOTE — Telephone Encounter (Signed)
Ok to refill??  Last office visit 05/21/2018.  Last refill 10/08/2018.

## 2018-12-19 ENCOUNTER — Other Ambulatory Visit: Payer: Self-pay | Admitting: Family Medicine

## 2019-01-02 ENCOUNTER — Other Ambulatory Visit: Payer: Self-pay | Admitting: Family Medicine

## 2019-01-05 ENCOUNTER — Other Ambulatory Visit: Payer: Self-pay | Admitting: Family Medicine

## 2019-01-06 NOTE — Telephone Encounter (Signed)
Requesting refill    Tramadol   LOV: 05/21/18  LRF:  11/24/18

## 2019-01-26 ENCOUNTER — Other Ambulatory Visit: Payer: Self-pay | Admitting: Family Medicine

## 2019-02-27 ENCOUNTER — Other Ambulatory Visit: Payer: Self-pay | Admitting: Family Medicine

## 2019-03-01 NOTE — Telephone Encounter (Signed)
Requesting refill    Tramadol  LOV: 05/21/18  LRF:  01/07/19

## 2019-03-18 ENCOUNTER — Encounter: Payer: Self-pay | Admitting: Family Medicine

## 2019-03-18 ENCOUNTER — Ambulatory Visit (INDEPENDENT_AMBULATORY_CARE_PROVIDER_SITE_OTHER): Payer: Medicare Other | Admitting: Family Medicine

## 2019-03-18 VITALS — BP 154/100 | HR 84 | Temp 97.8°F | Resp 16 | Ht 63.0 in | Wt 162.0 lb

## 2019-03-18 DIAGNOSIS — E7800 Pure hypercholesterolemia, unspecified: Secondary | ICD-10-CM

## 2019-03-18 DIAGNOSIS — I10 Essential (primary) hypertension: Secondary | ICD-10-CM | POA: Diagnosis not present

## 2019-03-18 DIAGNOSIS — E119 Type 2 diabetes mellitus without complications: Secondary | ICD-10-CM

## 2019-03-18 DIAGNOSIS — N3281 Overactive bladder: Secondary | ICD-10-CM

## 2019-03-18 DIAGNOSIS — E538 Deficiency of other specified B group vitamins: Secondary | ICD-10-CM

## 2019-03-18 MED ORDER — AMLODIPINE BESYLATE 10 MG PO TABS: 10.0000 mg | ORAL_TABLET | Freq: Every day | ORAL | 3 refills | Status: DC

## 2019-03-18 MED ORDER — VITAMIN B-12 1000 MCG PO TABS: 1000.0000 ug | ORAL_TABLET | Freq: Every day | ORAL | 5 refills | Status: DC

## 2019-03-18 MED ORDER — DICLOFENAC SODIUM 1 % TD GEL: 2.0000 g | Freq: Four times a day (QID) | TRANSDERMAL | 2 refills | Status: DC

## 2019-03-18 MED ORDER — SOLIFENACIN SUCCINATE 10 MG PO TABS: 10.0000 mg | ORAL_TABLET | Freq: Every day | ORAL | 3 refills | Status: DC

## 2019-03-18 NOTE — Progress Notes (Signed)
Subjective:    Patient ID: Claudia [name], female    DOB: [birth year], 78 y.o.   MRN: [id number]  Medication Refill  05/07/18 Patient has not been seen in over a year.  Her [name] pressure today is elevated 154/110.  Communication poses an issue however she is adamant that she is taking losartan as well as metoprolol.  Hydrochlorothiazide is on her medication list however the patient states she is not taking that.  There is no hydrochlorothiazide in her pillbox.  Therefore it appears that she ran out of this medication at some point in the past.  She denies any chest pain shortness of breath or dyspnea on exertion.  She refuses her flu shot.  She is not checking her [name] sugars but she denies any polyuria, polydipsia, or blurry vision.  She is overdue for fasting lab work.  She does complain of bilateral knee pain and crepitus despite taking naproxen twice a day.  We discussed the possibility of cortisone injections in the knee but the patient declined that.  The pain is not bothering her bad enough that she requests a shot.  At that time, my plan was: I am concerned by her [name] pressure.  I recommended resuming hydrochlorothiazide 25 mg a day in addition to her losartan and metoprolol and then rechecking her [name] pressure in 2 weeks.  Offered a flu shot but she declined.  I will check a CBC, CMP, fasting lipid panel, hemoglobin A1c, and a urine microalbumin.  Goal hemoglobin A1c for this patient will be less than 7 given her age.  Goal LDL cholesterol is less than 100.  05/21/18  Patient's hemoglobin A1c was excellent at 6.4.  However she was found to be mildly anemic and also to have microcytic anemia.  She is here today to get a stool kit to check for occult [name] as well as to check an iron level.  I started her on hydrochlorothiazide for hypertension because her [name] pressure was so high last week.  However today her [name] pressure is extremely low.  I checked it in her left arm and found to be  114/60.  I checked in her right arm and found to be 110/62.  She feels extremely dizzy and weak and tired since starting the medication.  She also reports constipation.  She states that she has not had a bowel movement in over 3 days.  She often has to strain to have a bowel movement.  This was ongoing before she started hydrochlorothiazide.  The melena or hematochezia or abdominal discomfort.  At that time, my plan was: Because of the microcytic anemia, I will check an iron level.  I will also check a stool test for [name].  Her [name] pressure today is low particular for her age.  She appears overmedicated.  Discontinue the hydrochlorothiazide as started at her last office visit and come by and let us recheck the [name] pressure off the medication in 2 weeks.  If elevated again, maybe we could start her on half a pill a day.  I also gave her samples of Linzess 74 mcg daily for chronic constipation.  03/18/19 Iron was normal.  B12 was low at 197.  Never returned stool sample to look for [name].  We are due to recheck a CBC.  She states that she is taking vitamin B12 although it is not currently on her medicine list.  She denies any issues with her memory.  She denies any neuropathy, pins-and-needles, numbness, tingling in  her feet.  Diabetic foot exam was performed today and is normal aside from lateral deviation of the right great toe.  It is located slightly under the second toe.  However there is no ulcer or callus formation on the first MTP joint.  There is no ulceration.  She denies any chest pain shortness of breath or dyspnea on exertion.  She does report pain and stiffness in both hands every day unrelieved by naproxen.  She denies any numbness or tingling in her hands.  She is not checking her [name] sugar however her daughter denies any polyuria, polydipsia, or blurry vision.  She does report however urinary incontinence.  She denies any stress incontinence.  Instead she has urge incontinence and she is  started to wear depends.  Her [name] pressure today is high.  She is not taking hydrochlorothiazide because it made her sick despite that being on her medicine list. Past Medical History:  Diagnosis Date  . Anemia   . Arthritis   . Diabetes mellitus without complication ([place])   . GERD (gastroesophageal reflux disease)    chronic gastritis  . Hyperlipidemia   . Hypertension    No past surgical history on file. Current Outpatient Medications on File Prior to Visit  Medication Sig Dispense Refill  . aspirin 81 MG tablet Take 1 tablet (81 mg total) by mouth daily. 30 tablet 11  . cyclobenzaprine (FLEXERIL) 5 MG tablet Take 1 tablet (5 mg total) by mouth every 8 (eight) hours. (Patient not taking: Reported on 05/21/2018) 90 tablet 2  . fluticasone (FLONASE) 50 MCG/ACT nasal spray PLACE 2 SPRAYS INTO BOTH NOSTRILS DAILY. 16 g 11  . glucose [name] (BAYER CONTOUR TEST) test strip Check BS QAM fasting 50 each 5  . hydrochlorothiazide (HYDRODIURIL) 25 MG tablet TOME UNA TABLETA TODOS LOS DIAS 90 tablet 0  . loratadine (CLARITIN) 10 MG tablet Take 10 mg by mouth daily.    Marland Kitchen losartan (COZAAR) 100 MG tablet TOME UNA TABLETA TODOS LOS DIAS 90 tablet 1  . metFORMIN (GLUCOPHAGE) 1000 MG tablet TOME UNA TABLETA DOS VECES AL DIA 60 tablet 2  . metoprolol succinate (TOPROL-XL) 25 MG 24 hr tablet TOME UNA TABLETA POR VIA ORAL TODOS LOS DIAS 90 tablet 3  . naproxen (NAPRELAN) 500 MG 24 hr tablet     . naproxen (NAPRELAN) 500 MG 24 hr tablet TAKE 1 TABLET BY MOUTH TWICE A DAY WITH A MEAL 60 tablet 5  . rosuvastatin (CRESTOR) 10 MG tablet TOME UNA TABLETA TODOS LOS DIAS 90 tablet 0  . tiZANidine (ZANAFLEX) 4 MG tablet TOME UNA TABLETA CADA OCHO HORAS 90 tablet 0  . traMADol (ULTRAM) 50 MG tablet TOME UNA TABLETA POR VIA ORAL CADA SEIS HORAS CUANDO SEA NECESARIO PARA EL DOLOR 120 tablet 0   No current facility-administered medications on file prior to visit.    No Known Allergies Social History    Socioeconomic History  . Marital status: Single    Spouse name: Not on file  . Number of children: Not on file  . Years of education: Not on file  . Highest education level: Not on file  Occupational History  . Not on file  Social Needs  . Financial resource strain: Not on file  . Food insecurity    Worry: Not on file    Inability: Not on file  . Transportation needs    Medical: Not on file    Non-medical: Not on file  Tobacco Use  . Smoking status:  Never Smoker  . Smokeless tobacco: Never Used  Substance and Sexual Activity  . Alcohol use: No  . Drug use: No  . Sexual activity: Not Currently  Lifestyle  . Physical activity    Days per week: Not on file    Minutes per session: Not on file  . Stress: Not on file  Relationships  . Social Herbalist on phone: Not on file    Gets together: Not on file    Attends religious service: Not on file    Active member of club or organization: Not on file    Attends meetings of clubs or organizations: Not on file    Relationship status: Not on file  . Intimate partner violence    Fear of current or ex partner: Not on file    Emotionally abused: Not on file    Physically abused: Not on file    Forced sexual activity: Not on file  Other Topics Concern  . Not on file  Social History Narrative  . Not on file     Review of Systems  All other systems reviewed and are negative.      Objective:   Physical Exam  Constitutional: She appears well-developed and well-nourished.  Eyes: Pupils are equal, round, and reactive to light. Conjunctivae and EOM are normal.  Neck: Neck supple. No JVD present. No thyromegaly present.  Cardiovascular: Normal rate, regular rhythm, normal heart sounds and intact distal pulses. Exam reveals no gallop and no friction rub.  No murmur heard. Pulmonary/Chest: Effort normal and breath sounds normal. No respiratory distress. She has no wheezes. She has no rales.  Abdominal: Soft. Bowel  sounds are normal. She exhibits no distension and no mass. There is no abdominal tenderness. There is no rebound and no guarding.  Musculoskeletal:        General: No edema.  Lymphadenopathy:    She has no cervical adenopathy.  Vitals reviewed.         Assessment & Plan:  1. Essential hypertension [name] pressures poorly controlled.  She is not taking hydrochlorothiazide on her medicine list.  Use amlodipine 10 mg a day and recheck [name] pressure in 1 month. - amLODipine (NORVASC) 10 MG tablet; Take 1 tablet (10 mg total) by mouth daily.  Dispense: 90 tablet; Refill: 3  2. Pure hypercholesterolemia Check fasting lipid panel.  Goal LDL cholesterol is less than 100.  3. Controlled type 2 diabetes mellitus without complication, without long-term current use of insulin (HCC) Check hemoglobin A1c.  Goal hemoglobin A1c is less than 6.5. - Hemoglobin A1c - CBC with Differential/Platelet - COMPLETE METABOLIC PANEL WITH GFR - Lipid panel - Microalbumin, urine  4. Vitamin B12 deficiency I will check a CBC as well as a vitamin B12 level.  If B12 level is now normal and the patient remains anemic, I would want a recheck stool for [name] - Vitamin B12  5. OAB (overactive bladder) We will try Vesicare 10 mg a day for overactive bladder and see if her symptoms improve  I believe the pain and stiffness in her hands is secondary to osteoarthritis.  We will try Voltaren gel 2 g applied topically 4 times daily as needed pain and stiffness in the hands

## 2019-03-19 LAB — CBC WITH DIFFERENTIAL/PLATELET
Absolute Monocytes: 544 cells/uL (ref 200–950)
Basophils Absolute: 58 cells/uL (ref 0–200)
Basophils Relative: 0.9 %
Eosinophils Absolute: 256 cells/uL (ref 15–500)
Eosinophils Relative: 4 %
HCT: 38.3 % (ref 35.0–45.0)
Hemoglobin: 12.3 g/dL (ref 11.7–15.5)
Lymphs Abs: 2432 cells/uL (ref 850–3900)
MCH: 26 pg — ABNORMAL LOW (ref 27.0–33.0)
MCHC: 32.1 g/dL (ref 32.0–36.0)
MCV: 81 fL (ref 80.0–100.0)
MPV: 11.6 fL (ref 7.5–12.5)
Monocytes Relative: 8.5 %
Neutro Abs: 3110 cells/uL (ref 1500–7800)
Neutrophils Relative %: 48.6 %
Platelets: 282 10*3/uL (ref 140–400)
RBC: 4.73 10*6/uL (ref 3.80–5.10)
RDW: 17.3 % — ABNORMAL HIGH (ref 11.0–15.0)
Total Lymphocyte: 38 %
WBC: 6.4 10*3/uL (ref 3.8–10.8)

## 2019-03-19 LAB — COMPLETE METABOLIC PANEL WITH GFR
AG Ratio: 1.5 (calc) (ref 1.0–2.5)
ALT: 9 U/L (ref 6–29)
AST: 17 U/L (ref 10–35)
Albumin: 4 g/dL (ref 3.6–5.1)
Alkaline phosphatase (APISO): 70 U/L (ref 37–153)
BUN/Creatinine Ratio: 17 (calc) (ref 6–22)
BUN: 18 mg/dL (ref 7–25)
CO2: 26 mmol/L (ref 20–32)
Calcium: 9.5 mg/dL (ref 8.6–10.4)
Chloride: 106 mmol/L (ref 98–110)
Creat: 1.09 mg/dL — ABNORMAL HIGH (ref 0.60–0.93)
GFR, Est African American: 56 mL/min/{1.73_m2} — ABNORMAL LOW (ref >=60)
GFR, Est Non African American: 48 mL/min/{1.73_m2} — ABNORMAL LOW (ref >=60)
Globulin: 2.6 g/dL (calc) (ref 1.9–3.7)
Glucose, Bld: 98 mg/dL (ref 65–99)
Potassium: 5 mmol/L (ref 3.5–5.3)
Sodium: 141 mmol/L (ref 135–146)
Total Bilirubin: 0.5 mg/dL (ref 0.2–1.2)
Total Protein: 6.6 g/dL (ref 6.1–8.1)

## 2019-03-19 LAB — VITAMIN B12: Vitamin B-12: >2000 pg/mL — ABNORMAL HIGH (ref 200–1100)

## 2019-03-19 LAB — LIPID PANEL
Cholesterol: 143 mg/dL (ref <200)
HDL: 45 mg/dL — ABNORMAL LOW (ref >=50)
LDL Cholesterol (Calc): 80 mg/dL (calc)
Non-HDL Cholesterol (Calc): 98 mg/dL (calc) (ref <130)
Total CHOL/HDL Ratio: 3.2 (calc) (ref <5.0)
Triglycerides: 93 mg/dL (ref <150)

## 2019-03-19 LAB — HEMOGLOBIN A1C
Hgb A1c MFr Bld: 6 % of total Hgb — ABNORMAL HIGH (ref <5.7)
Mean Plasma Glucose: 126 (calc)
eAG (mmol/L): 7 (calc)

## 2019-03-19 LAB — MICROALBUMIN, URINE: Microalb, Ur: 23.6 mg/dL

## 2019-04-28 ENCOUNTER — Other Ambulatory Visit: Payer: Self-pay | Admitting: Family Medicine

## 2019-04-28 NOTE — Telephone Encounter (Signed)
Ok to refill??  Last office visit 03/18/2019.  Last refill 03/01/2019.

## 2019-06-09 ENCOUNTER — Other Ambulatory Visit: Payer: Self-pay | Admitting: Family Medicine

## 2019-06-29 ENCOUNTER — Other Ambulatory Visit: Payer: Self-pay | Admitting: Family Medicine

## 2019-06-29 NOTE — Telephone Encounter (Signed)
Requesting refill    Tramadol  LOV: 03/18/2019  LRF:  04/29/2019

## 2019-08-12 ENCOUNTER — Ambulatory Visit (INDEPENDENT_AMBULATORY_CARE_PROVIDER_SITE_OTHER): Payer: Medicare Other | Admitting: Family Medicine

## 2019-08-12 ENCOUNTER — Encounter: Payer: Self-pay | Admitting: Family Medicine

## 2019-08-12 ENCOUNTER — Other Ambulatory Visit: Payer: Self-pay

## 2019-08-12 VITALS — BP 122/68 | HR 88 | Temp 98.0°F | Resp 16 | Ht 63.0 in | Wt 146.0 lb

## 2019-08-12 DIAGNOSIS — R1319 Other dysphagia: Secondary | ICD-10-CM

## 2019-08-12 DIAGNOSIS — R1013 Epigastric pain: Secondary | ICD-10-CM | POA: Diagnosis not present

## 2019-08-12 DIAGNOSIS — R1310 Dysphagia, unspecified: Secondary | ICD-10-CM | POA: Diagnosis not present

## 2019-08-12 MED ORDER — PANTOPRAZOLE SODIUM 40 MG PO TBEC: 40.0000 mg | DELAYED_RELEASE_TABLET | Freq: Every day | ORAL | 3 refills | Status: DC

## 2019-08-12 MED ORDER — SUCRALFATE 1 GM/10ML PO SUSP: 1.0000 g | Freq: Three times a day (TID) | ORAL | 0 refills | Status: DC

## 2019-08-12 NOTE — Progress Notes (Signed)
Subjective:    Patient ID: Claudia [name], female    DOB: [birth year], 78 y.o.   MRN: [id number]  Patient has a 1 week history of dysphagia.  She reports that food is sticking in her esophagus.  It will stick in her upper esophagus as well as her lower esophagus.  She reports a burning pain and a constant nausea.  She also reports an acid-like sensation coming up from her stomach into her esophagus.  She is able to swallow liquids without any problem.  However solids are often becoming stuck.  She states that the solid food will eventually pass down her esophagus after a prolonged period of time but this causes significant pain.  She is taking Naprosyn for "pain".  I strongly recommended she stop that immediately.  She is also not eating very much due to fear of the pain.  However she is still drinking liquid per her son and daughter's report.  She denies any shortness of breath or chest pain or angina.  She is not taking any proton pump inhibitor.  She denies any melena or hematochezia.  She denies any fevers or chills.  She has no right upper quadrant pain.  She has a negative Murphy sign on exam today.  She is still passing stool and flatus.  She has a soft nondistended nontender abdominal exam with no evidence of acute abdomen. Past Medical History:  Diagnosis Date  . Anemia   . Arthritis   . Diabetes mellitus without complication (HCC)   . GERD (gastroesophageal reflux disease)    chronic gastritis  . Hyperlipidemia   . Hypertension    History reviewed. No pertinent surgical history. Current Outpatient Medications on File Prior to Visit  Medication Sig Dispense Refill  . aspirin 81 MG tablet Take 1 tablet (81 mg total) by mouth daily. 30 tablet 11  . diclofenac sodium (VOLTAREN) 1 % GEL Apply 2 g topically 4 (four) times daily. 100 g 2  . glucose blood (BAYER CONTOUR TEST) test strip Check BS QAM fasting 50 each 5  . loratadine (CLARITIN) 10 MG tablet Take 10 mg by mouth daily.    .  metoprolol succinate (TOPROL-XL) 25 MG 24 hr tablet TOME UNA TABLETA POR VIA ORAL TODOS LOS DIAS 90 tablet 3  . solifenacin (VESICARE) 10 MG tablet TOME UNA TABLETA TODOS LOS DIAS 90 tablet 1  . traMADol (ULTRAM) 50 MG tablet TOME UNA TABLETA POR VIA ORAL CADA SEIS HORAS CUANDO SEA NECESARIO PARA EL DOLOR 120 tablet 0  . vitamin B-12 (CYANOCOBALAMIN) 1000 MCG tablet Take 1 tablet (1,000 mcg total) by mouth daily. 30 tablet 5  . amLODipine (NORVASC) 10 MG tablet Take 1 tablet (10 mg total) by mouth daily. 90 tablet 3  . fluticasone (FLONASE) 50 MCG/ACT nasal spray PLACE 2 SPRAYS INTO BOTH NOSTRILS DAILY. (Patient not taking: Reported on 08/12/2019) 16 g 11  . losartan (COZAAR) 100 MG tablet TOME UNA TABLETA TODOS LOS DIAS (Patient not taking: Reported on 08/12/2019) 90 tablet 1  . metFORMIN (GLUCOPHAGE) 1000 MG tablet TOME UNA TABLETA DOS VECES AL DIA (Patient not taking: Reported on 08/12/2019) 60 tablet 2  . naproxen (NAPRELAN) 500 MG 24 hr tablet TAKE 1 TABLET BY MOUTH TWICE A DAY WITH A MEAL (Patient not taking: Reported on 08/12/2019) 60 tablet 5  . rosuvastatin (CRESTOR) 10 MG tablet TOME UNA TABLETA TODOS LOS DIAS (Patient not taking: Reported on 08/12/2019) 90 tablet 0   No current facility-administered medications on file prior  to visit.   No Known Allergies Social History   Socioeconomic History  . Marital status: Single    Spouse name: Not on file  . Number of children: Not on file  . Years of education: Not on file  . Highest education level: Not on file  Occupational History  . Not on file  Tobacco Use  . Smoking status: Never Smoker  . Smokeless tobacco: Never Used  Substance and Sexual Activity  . Alcohol use: No  . Drug use: No  . Sexual activity: Not Currently  Other Topics Concern  . Not on file  Social History Narrative  . Not on file   Social Determinants of Health   Financial Resource Strain:   . Difficulty of Paying Living Expenses: Not on file  Food  Insecurity:   . Worried About Charity fundraiser in the Last Year: Not on file  . Ran Out of Food in the Last Year: Not on file  Transportation Needs:   . Lack of Transportation (Medical): Not on file  . Lack of Transportation (Non-Medical): Not on file  Physical Activity:   . Days of Exercise per Week: Not on file  . Minutes of Exercise per Session: Not on file  Stress:   . Feeling of Stress : Not on file  Social Connections:   . Frequency of Communication with Friends and Family: Not on file  . Frequency of Social Gatherings with Friends and Family: Not on file  . Attends Religious Services: Not on file  . Active Member of Clubs or Organizations: Not on file  . Attends Archivist Meetings: Not on file  . Marital Status: Not on file  Intimate Partner Violence:   . Fear of Current or Ex-Partner: Not on file  . Emotionally Abused: Not on file  . Physically Abused: Not on file  . Sexually Abused: Not on file     Review of Systems  All other systems reviewed and are negative.      Objective:   Physical Exam  Constitutional: She appears well-developed and well-nourished.  Eyes: Pupils are equal, round, and reactive to light. Conjunctivae and EOM are normal.  Neck: No JVD present. No thyromegaly present.  Cardiovascular: Normal rate, regular rhythm, normal heart sounds and intact distal pulses. Exam reveals no gallop and no friction rub.  No murmur heard. Pulmonary/Chest: Effort normal and breath sounds normal. No respiratory distress. She has no wheezes. She has no rales.  Abdominal: Soft. Bowel sounds are normal. She exhibits no distension and no mass. There is no abdominal tenderness. There is no rebound and no guarding.  Musculoskeletal:        General: No edema.     Cervical back: Neck supple.  Lymphadenopathy:    She has no cervical adenopathy.  Vitals reviewed.         Assessment & Plan:  Epigastric pain - Plan: CBC with Differential, COMPLETE  METABOLIC PANEL WITH GFR, Lipase  Esophageal dysphagia  Differential diagnosis includes peptic ulcer, esophagitis, esophageal stricture, or an obstructing mass.  I recommended the patient begin Protonix 40 mg a day coupled with sucralfate 1 g 3 times a day before meals.  Because she is not eating or drinking very much I want him to discontinue amlodipine as her blood pressure is somewhat low.  Also want him to discontinue Metformin because she is not eating very well to avoid lactic acidosis.  Also strongly recommended they stop any NSAIDs due to potential gastritis.  I will consult GI as soon as possible as I believe the patient would benefit from both a diagnostic and therapeutic EGD if there is in fact an esophageal stricture or obstructive mass.  If the patient continues to fail to eat and drink sufficiently at home she may need to be admitted to the hospital for dehydration however I encouraged the patient's family to push fluids through the weekend.

## 2019-08-13 LAB — COMPLETE METABOLIC PANEL WITH GFR
AG Ratio: 1.2 (calc) (ref 1.0–2.5)
ALT: 7 U/L (ref 6–29)
AST: 14 U/L (ref 10–35)
Albumin: 3.6 g/dL (ref 3.6–5.1)
Alkaline phosphatase (APISO): 98 U/L (ref 37–153)
BUN/Creatinine Ratio: 20 (calc) (ref 6–22)
BUN: 31 mg/dL — ABNORMAL HIGH (ref 7–25)
CO2: 19 mmol/L — ABNORMAL LOW (ref 20–32)
Calcium: 9.5 mg/dL (ref 8.6–10.4)
Chloride: 106 mmol/L (ref 98–110)
Creat: 1.56 mg/dL — ABNORMAL HIGH (ref 0.60–0.88)
GFR, Est African American: 36 mL/min/{1.73_m2} — ABNORMAL LOW (ref >=60)
GFR, Est Non African American: 31 mL/min/{1.73_m2} — ABNORMAL LOW (ref >=60)
Globulin: 2.9 g/dL (calc) (ref 1.9–3.7)
Glucose, Bld: 135 mg/dL — ABNORMAL HIGH (ref 65–99)
Potassium: 5.1 mmol/L (ref 3.5–5.3)
Sodium: 140 mmol/L (ref 135–146)
Total Bilirubin: 0.5 mg/dL (ref 0.2–1.2)
Total Protein: 6.5 g/dL (ref 6.1–8.1)

## 2019-08-13 LAB — CBC WITH DIFFERENTIAL/PLATELET
Absolute Monocytes: 657 cells/uL (ref 200–950)
Basophils Absolute: 61 cells/uL (ref 0–200)
Basophils Relative: 0.6 %
Eosinophils Absolute: 71 cells/uL (ref 15–500)
Eosinophils Relative: 0.7 %
HCT: 41.4 % (ref 35.0–45.0)
Hemoglobin: 13.1 g/dL (ref 11.7–15.5)
Lymphs Abs: 1252 cells/uL (ref 850–3900)
MCH: 26.3 pg — ABNORMAL LOW (ref 27.0–33.0)
MCHC: 31.6 g/dL — ABNORMAL LOW (ref 32.0–36.0)
MCV: 83.1 fL (ref 80.0–100.0)
MPV: 10.7 fL (ref 7.5–12.5)
Monocytes Relative: 6.5 %
Neutro Abs: 8060 cells/uL — ABNORMAL HIGH (ref 1500–7800)
Neutrophils Relative %: 79.8 %
Platelets: 302 10*3/uL (ref 140–400)
RBC: 4.98 10*6/uL (ref 3.80–5.10)
RDW: 16.2 % — ABNORMAL HIGH (ref 11.0–15.0)
Total Lymphocyte: 12.4 %
WBC: 10.1 10*3/uL (ref 3.8–10.8)

## 2019-08-13 LAB — LIPASE: Lipase: 23 U/L (ref 7–60)

## 2019-08-16 ENCOUNTER — Encounter: Payer: Self-pay | Admitting: Gastroenterology

## 2019-08-16 ENCOUNTER — Ambulatory Visit (INDEPENDENT_AMBULATORY_CARE_PROVIDER_SITE_OTHER): Payer: Medicare Other | Admitting: Gastroenterology

## 2019-08-16 DIAGNOSIS — R1310 Dysphagia, unspecified: Secondary | ICD-10-CM | POA: Diagnosis not present

## 2019-08-16 DIAGNOSIS — R1013 Epigastric pain: Secondary | ICD-10-CM | POA: Diagnosis not present

## 2019-08-16 DIAGNOSIS — D509 Iron deficiency anemia, unspecified: Secondary | ICD-10-CM

## 2019-08-16 DIAGNOSIS — Z1159 Encounter for screening for other viral diseases: Secondary | ICD-10-CM

## 2019-08-16 NOTE — Patient Instructions (Addendum)
If you are age 81 or older, your body mass index should be between 23-30. Your Body mass index is 30.92 kg/m. If this is out of the aforementioned range listed, please consider follow up with your Primary Care Provider.  If you are age 85 or younger, your body mass index should be between 19-25. Your Body mass index is 30.92 kg/m. If this is out of the aformentioned range listed, please consider follow up with your Primary Care Provider.   You have been scheduled for an endoscopy. Please follow written instructions given to you at your visit today. If you use inhalers (even only as needed), please bring them with you on the day of your procedure. Your physician has requested that you go to www.startemmi.com and enter the access code given to you at your visit today. This web site gives a general overview about your procedure. However, you should still follow specific instructions given to you by our office regarding your preparation for the procedure.  Continue Protonix and carafate.   Please hold naproxen as instructed.  We will request your records from Dr Haywood Pao office. (EGD/Colon-2013)  Thank you for entrusting me with your care and for choosing [facility], Dr. Ileene Patrick

## 2019-08-16 NOTE — Progress Notes (Signed)
HPI :  79 year old female with a history of diabetes, GERD, iron deficiency, referred by Dr.Warren Tanya Nones MD for epigastric pain and dysphagia.  Patient is accompanied by her son today who provided translation.  He reports she is had difficulty eating for the past 1 to 2 weeks.  He states she is having dysphagia to both solids and liquids, she is also had some epigastric discomfort with eating and due to the symptoms she has been eating a lot less.  She denied any nausea or vomiting.  Discomfort seem to be in the epigastric/lower sternal area.  He endorses that she has had a history of reflux in the past.  She was previously taking Naprosyn routinely however Dr. Tanya Nones stopped this at the time of her visit.  She had taken occasional ibuprofen as well but not recently.  She uses a walker for ambulation but denies any cardiopulmonary symptoms that restrict her, mostly orthopedic limitations.  No family history of esophageal cancer, gastric cancer, or colon cancer.  On December 31 she was started on Protonix 40 mg a day and liquid Carafate as needed.  This has significantly improved some of her discomfort however has not resolved her symptoms entirely.  Overall she is improved and has had a bit better p.o. intake in recent days.  It appears she has a history of iron deficiency anemia, previously evaluated by Dr. Elnoria Howard in 2013.  This is from review of the chart, the patient nor the patient's son remembers what was done in when.  We will contact his office for records to clarify.  Her most recent labs were CBC on 1231 which showed hemoglobin of 13.1 with an MCV of 83.  Her BUN was 31 and creatinine had increased to 1.56 thought to be due to dehydration in the setting of poor p.o. intake.  LFTs normal.  Lipase was normal.  BP and heart rate look okay today in the clinic, she appears well-hydrated.  Past Medical History:  Diagnosis Date  . Anemia   . Arthritis   . Blind right eye   . Diabetes  mellitus without complication (HCC)   . GERD (gastroesophageal reflux disease)    chronic gastritis  . Hyperlipidemia   . Hypertension      Past Surgical History:  Procedure Laterality Date  . HERNIA REPAIR    . INDUCED ABORTION     Family History  Problem Relation Age of Onset  . Diabetes Sister   . Diabetes Daughter    Social History   Tobacco Use  . Smoking status: Former Smoker    Types: Cigarettes    Quit date: 1981    Years since quitting: 40.0  . Smokeless tobacco: Never Used  Substance Use Topics  . Alcohol use: No  . Drug use: No   Current Outpatient Medications  Medication Sig Dispense Refill  . aspirin 81 MG tablet Take 1 tablet (81 mg total) by mouth daily. 30 tablet 11  . glucose blood (BAYER CONTOUR TEST) test strip Check BS QAM fasting 50 each 5  . metoprolol succinate (TOPROL-XL) 25 MG 24 hr tablet TOME UNA TABLETA POR VIA ORAL TODOS LOS DIAS 90 tablet 3  . pantoprazole (PROTONIX) 40 MG tablet Take 1 tablet (40 mg total) by mouth daily. Please label in spanish 30 tablet 3  . solifenacin (VESICARE) 10 MG tablet TOME UNA TABLETA TODOS LOS DIAS 90 tablet 1  . sucralfate (CARAFATE) 1 GM/10ML suspension Take 10 mLs (1 g total) by mouth 4 (  four) times daily -  with meals and at bedtime. Please label in spanish 420 mL 0  . traMADol (ULTRAM) 50 MG tablet TOME UNA TABLETA POR VIA ORAL CADA SEIS HORAS CUANDO SEA NECESARIO PARA EL DOLOR 120 tablet 0  . vitamin B-12 (CYANOCOBALAMIN) 1000 MCG tablet Take 1 tablet (1,000 mcg total) by mouth daily. 30 tablet 5  . amLODipine (NORVASC) 10 MG tablet Take 1 tablet (10 mg total) by mouth daily. (Patient not taking: Reported on 08/16/2019) 90 tablet 3  . diclofenac sodium (VOLTAREN) 1 % GEL Apply 2 g topically 4 (four) times daily. (Patient not taking: Reported on 08/16/2019) 100 g 2  . loratadine (CLARITIN) 10 MG tablet Take 10 mg by mouth daily.    Marland Kitchen losartan (COZAAR) 100 MG tablet TOME UNA TABLETA TODOS LOS DIAS (Patient not  taking: Reported on 08/12/2019) 90 tablet 1  . metFORMIN (GLUCOPHAGE) 1000 MG tablet TOME UNA TABLETA DOS VECES AL DIA (Patient not taking: Reported on 08/12/2019) 60 tablet 2  . naproxen (NAPRELAN) 500 MG 24 hr tablet TAKE 1 TABLET BY MOUTH TWICE A DAY WITH A MEAL (Patient not taking: Reported on 08/12/2019) 60 tablet 5  . rosuvastatin (CRESTOR) 10 MG tablet TOME UNA TABLETA TODOS LOS DIAS (Patient not taking: Reported on 08/12/2019) 90 tablet 0   No current facility-administered medications for this visit.   No Known Allergies   Review of Systems: All systems reviewed and negative except where noted in HPI.   Lab Results  Component Value Date   WBC 10.1 08/12/2019   HGB 13.1 08/12/2019   HCT 41.4 08/12/2019   MCV 83.1 08/12/2019   PLT 302 08/12/2019    Lab Results  Component Value Date   CREATININE 1.56 (H) 08/12/2019   BUN 31 (H) 08/12/2019   NA 140 08/12/2019   K 5.1 08/12/2019   CL 106 08/12/2019   CO2 19 (L) 08/12/2019    Lab Results  Component Value Date   ALT 7 08/12/2019   AST 14 08/12/2019   ALKPHOS 93 04/01/2017   BILITOT 0.5 08/12/2019   Lab Results  Component Value Date   IRON 72 05/21/2018   TIBC 369 05/21/2018   FERRITIN 14 (L) 05/21/2018     Physical Exam: BP 104/70 (BP Location: Left Arm, Patient Position: Sitting, Cuff Size: Normal)   Pulse 84   Temp 97.8 F (36.6 C)   Ht 4' 10.5" (1.486 m) Comment: height measured without shoes  Wt 150 lb 8 oz (68.3 kg)   BMI 30.92 kg/m  Constitutional: Pleasant, female in no acute distress, uses walker to ambulate HEENT: Normocephalic and atraumatic. Conjunctivae are normal. No scleral icterus. Neck supple.  Cardiovascular: Normal rate, regular rhythm.  Pulmonary/chest: Effort normal and breath sounds normal. No wheezing, rales or rhonchi. Abdominal: Soft, nondistended, nontender. There are no masses palpable. No hepatomegaly. Extremities: no edema Lymphadenopathy: No cervical adenopathy  noted. Neurological: Alert and oriented to person place and time. Skin: Skin is warm and dry. No rashes noted. Psychiatric: Normal mood and affect. Behavior is normal.   ASSESSMENT AND PLAN: 79 year old female here for new patient assessment of the following issues:  Dysphagia / epigastric pain - a few weeks worth of relatively new dysphagia and epigastric pain which led to decreased p.o. intake and dehydration last week.  This occurred in the setting of NSAID use.  She was appropriately counseled to stop all NSAIDs by Dr. Dennard Schaumann and she has done so.  He also provided her some Protonix  40 mg once a day as well as some liquid Carafate and her symptoms have definitely improved since that time and she is eating better, although has not completely resolved.  I discussed differential diagnosis with them, it is quite possible she has reflux esophagitis and associated peptic stricture, although PUD, malignancy, etc. also on the differential.  I am recommending an EGD to further evaluate cause for her symptoms, potentially dilate stricture if present.  We discussed risks and benefits of endoscopy and anesthesia and they want to proceed.  In the interim she should continue her Protonix and liquid Carafate as directed given her interval improvement.  She should continue to avoid all NSAIDs, further recommendations pending the results of her endoscopy and her course.  She agreed  History of iron deficiency - she does have a history of iron deficiency and review of labs.  Her most recent hemoglobin was normal fortunately.  I do not have records of any prior work-up for this, I reach out to Dr. Haywood Pao office, he may have done a prior EGD and colonoscopy for her several years ago.  Will await that result.  They agreed  Ileene Patrick, MD [facility]  CC: Donita Brooks, MD   I spent 45-59 minutes of time, including in depth chart review, independent review of results as outlined above,  communicating results with the patient directly, face-to-face time with the patient, coordinating care, and ordering studies and medications as appropriate. Greater than 50% of the time was spent counseling and coordinating care.

## 2019-08-17 ENCOUNTER — Ambulatory Visit (INDEPENDENT_AMBULATORY_CARE_PROVIDER_SITE_OTHER): Payer: Medicare Other

## 2019-08-17 DIAGNOSIS — Z1159 Encounter for screening for other viral diseases: Secondary | ICD-10-CM

## 2019-08-18 ENCOUNTER — Other Ambulatory Visit: Payer: Self-pay | Admitting: Family Medicine

## 2019-08-18 LAB — SARS CORONAVIRUS 2 (TAT 6-24 HRS): SARS Coronavirus 2: NEGATIVE

## 2019-08-19 ENCOUNTER — Encounter: Payer: Self-pay | Admitting: Gastroenterology

## 2019-08-19 ENCOUNTER — Other Ambulatory Visit: Payer: Self-pay

## 2019-08-19 ENCOUNTER — Ambulatory Visit (AMBULATORY_SURGERY_CENTER): Payer: Medicare Other | Admitting: Gastroenterology

## 2019-08-19 VITALS — BP 107/68 | HR 106 | Temp 97.6°F | Resp 16 | Ht <= 58 in | Wt 150.0 lb

## 2019-08-19 DIAGNOSIS — K2210 Ulcer of esophagus without bleeding: Secondary | ICD-10-CM | POA: Diagnosis not present

## 2019-08-19 DIAGNOSIS — B3781 Candidal esophagitis: Secondary | ICD-10-CM

## 2019-08-19 DIAGNOSIS — D509 Iron deficiency anemia, unspecified: Secondary | ICD-10-CM | POA: Diagnosis not present

## 2019-08-19 DIAGNOSIS — R1310 Dysphagia, unspecified: Secondary | ICD-10-CM | POA: Diagnosis not present

## 2019-08-19 DIAGNOSIS — K2950 Unspecified chronic gastritis without bleeding: Secondary | ICD-10-CM | POA: Diagnosis not present

## 2019-08-19 DIAGNOSIS — K449 Diaphragmatic hernia without obstruction or gangrene: Secondary | ICD-10-CM | POA: Diagnosis not present

## 2019-08-19 DIAGNOSIS — R1013 Epigastric pain: Secondary | ICD-10-CM

## 2019-08-19 DIAGNOSIS — K3189 Other diseases of stomach and duodenum: Secondary | ICD-10-CM

## 2019-08-19 DIAGNOSIS — K209 Esophagitis, unspecified without bleeding: Secondary | ICD-10-CM

## 2019-08-19 DIAGNOSIS — K2100 Gastro-esophageal reflux disease with esophagitis, without bleeding: Secondary | ICD-10-CM

## 2019-08-19 MED ORDER — SODIUM CHLORIDE 0.9 % IV SOLN: 500.0000 mL | Freq: Once | INTRAVENOUS | Status: DC

## 2019-08-19 MED ORDER — FLUCONAZOLE 200 MG PO TABS: 200.0000 mg | ORAL_TABLET | Freq: Every day | ORAL | 0 refills | Status: DC

## 2019-08-19 NOTE — Progress Notes (Signed)
Pt's states no medical or surgical changes since previsit or office visit. 

## 2019-08-19 NOTE — Op Note (Signed)
[facility] Patient Name: Claudia DockerMaria Bilbo Procedure Date: 08/19/2019 10:05 AM MRN: [id number] Endoscopist: Viviann SpareSteven P. Adela LankArmbruster , MD Age: 79 Referring MD:  Date of Birth: 08-Nov-1940 Gender: Female Account #: [id number] Procedure:                Upper GI endoscopy Indications:              Epigastric abdominal pain, Dysphagia - improved                            with protonix and carafate - dysphagia resolved,                            epigastric pain improved. Medicines:                Monitored Anesthesia Care Procedure:                Pre-Anesthesia Assessment:                           - Prior to the procedure, a History and Physical                            was performed, and patient medications and                            allergies were reviewed. The patient's tolerance of                            previous anesthesia was also reviewed. The risks                            and benefits of the procedure and the sedation                            options and risks were discussed with the patient.                            All questions were answered, and informed consent                            was obtained. Prior Anticoagulants: The patient has                            taken no previous anticoagulant or antiplatelet                            agents. ASA Grade Assessment: III - A patient with                            severe systemic disease. After reviewing the risks                            and benefits, the patient was deemed in  satisfactory condition to undergo the procedure.                           After obtaining informed consent, the endoscope was                            passed under direct vision. Throughout the                            procedure, the patient's blood pressure, pulse, and                            oxygen saturations were monitored continuously. The                            Endoscope was  introduced through the mouth, and                            advanced to the second part of duodenum. The upper                            GI endoscopy was accomplished without difficulty.                            The patient tolerated the procedure well. Scope In: Scope Out: Findings:                 Esophagogastric landmarks were identified: the                            Z-line was found at 35 cm, the gastroesophageal                            junction was found at 35 cm and the upper extent of                            the gastric folds was found at 37 cm from the                            incisors. 2cm hiatal hernia                           Mild esophagitis was found 35 cm from the incisors                            at the GEJ.                           Localized mucosal changes were found in the lower                            third of the esophagus at 32cm, focal area of  irregular appearing mucosa. Biopsies were taken                            with a cold forceps for histology.                           Patchy, white and yellow plaques were found in the                            middle third of the esophagus and in the lower                            third of the esophagus, consistent with esophageal                            candidiasis.                           The exam of the esophagus was otherwise normal. No                            focal stricture / stenosis.                           Diffuse mild inflammation characterized by erosions                            and erythema was found in the entire examined                            stomach. The mucosa was diffusely atrophic                            throughout. Biopsies were taken with a cold forceps                            for Helicobacter pylori testing and histology.                           A single 8 to 10 mm papule (nodule) was found on                            the  anterior wall of the gastric body,                            subepithelial appearing. Biopsies were taken with a                            cold forceps for histology.                           A single 6 mm papule (nodule) was found in the  gastric fundus with an area of central                            umbilication. Biopsies were taken with a cold                            forceps for histology.                           The exam of the stomach was otherwise normal.                           The duodenal bulb and second portion of the                            duodenum were congested but otherwise normal. Complications:            No immediate complications. Estimated blood loss:                            Minimal. Estimated Blood Loss:     Estimated blood loss was minimal. Impression:               - Esophagogastric landmarks identified.                           - Mild reflux esophagitis.                           - Focal mucosal change at 32cm from the incisors in                            the esophagus. Biopsied.                           - Esophageal plaques were found, consistent with                            candidiasis.                           - 2cm hiatal hernia                           - Diffuse gastritis. Biopsied.                           - A single nodule) found in the stomach - distal                            gastric body anterior wall. Biopsied.                           - A single papule (nodule) found in the fundus.                            Biopsied.                           -  Normal duodenal bulb and second portion of the                            duodenum. Recommendation:           - Patient has a contact number available for                            emergencies. The signs and symptoms of potential                            delayed complications were discussed with the                            patient. Return to  normal activities tomorrow.                            Written discharge instructions were provided to the                            patient.                           - Resume previous diet.                           - Continue present medications.                           - Recommend fluconazole 400mg  x 1 dose, then 200mg                             / day for another 13 days to treat esophageal                            candidiasis                           - Await pathology results. P. Starkisha Tullis, MD 08/19/2019 10:37:33 AM This report has been signed electronically.

## 2019-08-19 NOTE — Progress Notes (Signed)
Called to room to assist during endoscopic procedure.  Patient ID and intended procedure confirmed with present staff. Received instructions for my participation in the procedure from the performing physician.  

## 2019-08-19 NOTE — Patient Instructions (Signed)
Go to [facility] on [address] and pick up medicine for yeast in esophagus: FLUCONAZOLE  Take 2 pills (400mg ) initially, then 1 pill (200mg ) everyday thereafter for 13 days. Please read handouts on gastritis and hiatal hernia.      YOU HAD AN ENDOSCOPIC PROCEDURE TODAY AT THE [facility]:   Refer to the procedure report that was given to you for any specific questions about what was found during the examination.  If the procedure report does not answer your questions, please call your gastroenterologist to clarify.  If you requested that your care partner not be given the details of your procedure findings, then the procedure report has been included in a sealed envelope for you to review at your convenience later.  YOU SHOULD EXPECT: Some feelings of bloating in the abdomen. Passage of more gas than usual.  Walking can help get rid of the air that was put into your GI tract during the procedure and reduce the bloating. If you had a lower endoscopy (such as a colonoscopy or flexible sigmoidoscopy) you may notice spotting of blood in your stool or on the toilet paper. If you underwent a bowel prep for your procedure, you may not have a normal bowel movement for a few days.  Please Note:  You might notice some irritation and congestion in your nose or some drainage.  This is from the oxygen used during your procedure.  There is no need for concern and it should clear up in a day or so.  SYMPTOMS TO REPORT IMMEDIATELY:    Following upper endoscopy (EGD)  Vomiting of blood or coffee ground material  New chest pain or pain under the shoulder blades  Painful or persistently difficult swallowing  New shortness of breath  Fever of 100F or higher  Black, tarry-looking stools  For urgent or emergent issues, a gastroenterologist can be reached at any hour by calling [phone].   DIET:  We do recommend a small meal at first, but then you may proceed to your regular diet.  Drink  plenty of fluids but you should avoid alcoholic beverages for 24 hours.  ACTIVITY:  You should plan to take it easy for the rest of today and you should NOT DRIVE or use heavy machinery until tomorrow (because of the sedation medicines used during the test).    FOLLOW UP: Our staff will call the number listed on your records 48-72 hours following your procedure to check on you and address any questions or concerns that you may have regarding the information given to you following your procedure. If we do not reach you, we will leave a message.  We will attempt to reach you two times.  During this call, we will ask if you have developed any symptoms of COVID 19. If you develop any symptoms (ie: fever, flu-like symptoms, shortness of breath, cough etc.) before then, please call [phone].  If you test positive for Covid 19 in the 2 weeks post procedure, please call and report this information to .    If any biopsies were taken you will be contacted by phone or by letter within the next 1-3 weeks.  Please call [phone] at [phone] if you have not heard about the biopsies in 3 weeks.    SIGNATURES/CONFIDENTIALITY: You and/or your care partner have signed paperwork which will be entered into your electronic medical record.  These signatures attest to the fact that that the information above on your After Visit Summary  has been reviewed and is understood.  Full responsibility of the confidentiality of this discharge information lies with you and/or your care-partner. 

## 2019-08-19 NOTE — Progress Notes (Signed)
To PACU< VSS. Report to [email or web address] 

## 2019-08-23 ENCOUNTER — Telehealth: Payer: Self-pay | Admitting: *Deleted

## 2019-08-23 NOTE — Telephone Encounter (Signed)
  Follow up Call-  Call back number 08/19/2019  Post procedure Call Back phone  # [phone]-daughter Annabel ok to speak to her. Patient does not speak english.  Permission to leave phone message Yes  Some recent data might be hidden     Patient questions:  Do you have a fever, pain , or abdominal swelling? No. Pain Score  0 *  Have you tolerated food without any problems? Yes.    Have you been able to return to your normal activities? Yes.    Do you have any questions about your discharge instructions: Diet   No. Medications  No. Follow up visit  No.  Do you have questions or concerns about your Care? No.  Actions: * If pain score is 4 or above: No action needed, pain <4.  Spoke with daughter.   1. Have you developed a fever since your procedure? no  2.   Have you had an respiratory symptoms (SOB or cough) since your procedure? no  3.   Have you tested positive for COVID 19 since your procedure  no  4.   Have you had any family members/close contacts diagnosed with the COVID 19 since your procedure?  no   If yes to any of these questions please route to Laverna Peace, RN and Jennye Boroughs, Charity fundraiser.

## 2019-08-23 NOTE — Telephone Encounter (Signed)
  Follow up Call-  Call back number 08/19/2019  Post procedure Call Back phone  # [phone]-daughter Annabel ok to speak to her. Patient does not speak english.  Permission to leave phone message Yes  Some recent data might be hidden     Patient questions:  Message left to call us if necessary.

## 2019-08-27 ENCOUNTER — Other Ambulatory Visit: Payer: Self-pay | Admitting: Family Medicine

## 2019-08-27 DIAGNOSIS — N289 Disorder of kidney and ureter, unspecified: Secondary | ICD-10-CM

## 2019-08-30 ENCOUNTER — Other Ambulatory Visit: Payer: Self-pay | Admitting: Family Medicine

## 2019-08-30 DIAGNOSIS — R0789 Other chest pain: Secondary | ICD-10-CM

## 2019-09-03 ENCOUNTER — Telehealth: Payer: Self-pay | Admitting: Gastroenterology

## 2019-09-03 NOTE — Telephone Encounter (Signed)
Records reviewed from prior workup with Dr. Elnoria Howard as outlined:  Both done for Iron deficiency anemia: Colonoscopy 04/15/2012 - normal colon, hemorrhoids, excellent prep EGD 04/15/2012 - small antral ulcers, biopsies positive for H pylori at the time.  She is due to see me in the clinic for a follow up visit in the next few weeks for reassessment.

## 2019-09-16 ENCOUNTER — Ambulatory Visit (INDEPENDENT_AMBULATORY_CARE_PROVIDER_SITE_OTHER): Payer: Medicare Other | Admitting: Gastroenterology

## 2019-09-16 ENCOUNTER — Other Ambulatory Visit: Payer: Self-pay

## 2019-09-16 ENCOUNTER — Other Ambulatory Visit: Payer: Self-pay | Admitting: Family Medicine

## 2019-09-16 ENCOUNTER — Encounter: Payer: Self-pay | Admitting: Gastroenterology

## 2019-09-16 VITALS — BP 90/60 | HR 61 | Temp 97.0°F | Ht 58.5 in | Wt 153.0 lb

## 2019-09-16 DIAGNOSIS — Z8619 Personal history of other infectious and parasitic diseases: Secondary | ICD-10-CM | POA: Diagnosis not present

## 2019-09-16 DIAGNOSIS — D509 Iron deficiency anemia, unspecified: Secondary | ICD-10-CM

## 2019-09-16 DIAGNOSIS — K3189 Other diseases of stomach and duodenum: Secondary | ICD-10-CM | POA: Diagnosis not present

## 2019-09-16 NOTE — Progress Notes (Signed)
HPI :  79 year old female here for follow-up visit for esophageal candidiasis, gastric nodules, and history of iron deficiency.  She is accompanied by her daughter today who provided translation.  She was previously seen for difficulty eating, mostly dysphagia and epigastric discomfort.  This caused poor appetite.  She was told to stop her NSAIDs, was started on Protonix and liquid Carafate as needed.  We proceeded with an endoscopy on 08/19/19 which showed the following:  EGD 08/19/2019  Findings: - 2cm HH - Mild esophagitis was found 35 cm from the incisors at the GEJ. - Localized mucosal changes were found in the lower third of the esophagus at 32cm, focal area of irregular appearing mucosa. Biopsies were taken with a cold forceps for histology. - Patchy, white and yellow plaques were found in the middle third of the esophagus and in the lower third of the esophagus, consistent with esophageal candidiasis. - The exam of the esophagus was otherwise normal. No focal stricture / stenosis. - Diffuse mild inflammation characterized by erosions and erythema was found in the entire examined stomach. The mucosa was diffusely atrophic throughout. Biopsies were taken with a cold forceps for Helicobacter pylori testing and histology. - A single 8 to 10 mm papule (nodule) was found on the anterior wall of the gastric body, subepithelial appearing. Biopsies were taken with a cold forceps for histology. - A single 6 mm papule (nodule) was found in the gastric fundus with an area of central umbilication. Biopsies were taken with a cold forceps for histology. - The exam of the stomach was otherwise normal. - The duodenal bulb and second portion of the duodenum were congested but otherwise Normal.  1. Surgical [P], gastric antrum and gastric body - CHRONIC GASTRITIS. - NO SUBMUCOSA PRESENT FOR EVALUATION. 2. Surgical [P], gastric body - CHRONIC GASTRITIS WITH FOCAL ANTRAL METAPLASIA. - NO SUBMUCOSA  PRESENT FOR EVALUATION. 3. Surgical [P], gastric fundus biopsy - CHRONIC GASTRITIS WITH FOCAL ANTRAL METAPLASIA. - NO SUBMUCOSA PRESENT FOR EVALUATION. 4. Surgical [P], esophagus - SEVERE ACUTE ESOPHAGITIS WITH ULCERATION.   Following this exam she was started on fluconazole for esophageal candidiasis.  She states this completely resolved her dysphagia and pain symptoms and she is doing quite well.  She denies any abdominal pain, she denies any weight loss.  She has no problems with her bowels, no blood in her stools.  She was also noted to have 2 gastric nodules, one subepithelial appearing.  Superficial biopsies were negative for a cause.  We discussed how aggressive she wanted to be with surveillance or further evaluation of this.  Historically she is also been noted to have an iron deficiency anemia in October 2019.  Her hemoglobin has been normal recently with a normal MCV.  We obtain records from her prior colonoscopy which was done with Dr. Elnoria Howard in 2013 and was a normal exam with an excellent prep.  On review of these procedures her exam was done for iron deficiency anemia at the time.  We also discussed if she wished to have further evaluation of that particular issue given her age.  She is been having problems with falls lately, knee and hip pain from this, using walker to ambulate.  She is not interested in any endoscopic follow-up or evaluation at this time.  She continues to use Naprosyn as needed for pain as well as tramadol.  She is taking Protonix once a day.  Records reviewed from prior workup with Dr. Elnoria Howard as outlined:  Both done  for Iron deficiency anemia: Colonoscopy 04/15/2012 - normal colon, hemorrhoids, excellent prep EGD 04/15/2012 - small antral ulcers, biopsies positive for H pylori at the time.    Past Medical History:  Diagnosis Date  . Anemia   . Arthritis   . Blind right eye   . Diabetes mellitus without complication ([place])   . GERD (gastroesophageal reflux  disease)    chronic gastritis  . Hyperlipidemia   . Hypertension      Past Surgical History:  Procedure Laterality Date  . HERNIA REPAIR    . INDUCED ABORTION     Family History  Problem Relation Age of Onset  . Diabetes Sister   . Diabetes Daughter    Social History   Tobacco Use  . Smoking status: Former Smoker    Types: Cigarettes    Quit date: 1981    Years since quitting: 40.1  . Smokeless tobacco: Never Used  Substance Use Topics  . Alcohol use: No  . Drug use: No   Current Outpatient Medications  Medication Sig Dispense Refill  . amLODipine (NORVASC) 10 MG tablet Take 1 tablet (10 mg total) by mouth daily. 90 tablet 3  . aspirin 81 MG tablet Take 1 tablet (81 mg total) by mouth daily. 30 tablet 11  . diclofenac sodium (VOLTAREN) 1 % GEL Apply 2 g topically 4 (four) times daily. 100 g 2  . fluconazole (DIFLUCAN) 200 MG tablet Take 1 tablet (200 mg total) by mouth daily. Take 400mg  (2 pills) for 1 dose, then 200mg  (1 pill) for 13 doses 15 tablet 0  . glucose blood (BAYER CONTOUR TEST) test strip Check BS QAM fasting 50 each 5  . loratadine (CLARITIN) 10 MG tablet Take 10 mg by mouth daily.    Marland Kitchen losartan (COZAAR) 100 MG tablet TOME UNA TABLETA TODOS LOS DIAS 90 tablet 1  . metFORMIN (GLUCOPHAGE) 1000 MG tablet TOME UNA TABLETA DOS VECES AL DIA 180 tablet 0  . metoprolol succinate (TOPROL-XL) 25 MG 24 hr tablet TOME UNA TABLETA POR VIA ORAL TODOS LOS DIAS 90 tablet 3  . naproxen (NAPRELAN) 500 MG 24 hr tablet TAKE 1 TABLET BY MOUTH TWICE A DAY WITH A MEAL 60 tablet 5  . pantoprazole (PROTONIX) 40 MG tablet Take 1 tablet (40 mg total) by mouth daily. Please label in spanish 30 tablet 3  . rosuvastatin (CRESTOR) 10 MG tablet TOME UNA TABLETA TODOS LOS DIAS 90 tablet 0  . solifenacin (VESICARE) 10 MG tablet TOME UNA TABLETA TODOS LOS DIAS 90 tablet 1  . sucralfate (CARAFATE) 1 GM/10ML suspension Take 10 mLs (1 g total) by mouth 4 (four) times daily -  with meals and at  bedtime. Please label in spanish 420 mL 0  . traMADol (ULTRAM) 50 MG tablet TOME UNA TABLETA POR VIA ORAL CADA SEIS HORAS CUANDO SEA NECESARIO PARA EL DOLOR 120 tablet 0  . vitamin B-12 (CYANOCOBALAMIN) 1000 MCG tablet Take 1 tablet (1,000 mcg total) by mouth daily. 30 tablet 5   No current facility-administered medications for this visit.   No Known Allergies   Review of Systems: All systems reviewed and negative except where noted in HPI.   Lab Results  Component Value Date   WBC 10.1 08/12/2019   HGB 13.1 08/12/2019   HCT 41.4 08/12/2019   MCV 83.1 08/12/2019   PLT 302 08/12/2019    Lab Results  Component Value Date   IRON 72 05/21/2018   TIBC 369 05/21/2018   FERRITIN 14 (L)  05/21/2018     Physical Exam: BP 90/60   Pulse 61   Temp (!) 97 F (36.1 C)   Ht 4' 10.5" (1.486 m)   Wt 153 lb (69.4 kg)   BMI 31.43 kg/m  Constitutional: Pleasant, female in no acute distress, using walker to ambulate Neurological: Alert and oriented to person place and time. Skin: Skin is warm and dry. No rashes noted. Psychiatric: Normal mood and affect. Behavior is normal.   ASSESSMENT AND PLAN: 79 year old female here for reassessment of the following issues:  Gastric nodule - subepithelial gastric nodule noted on recent EGD.  Discussed differential with the patient and daughter, this could be a possible carcinoid.  We discussed how aggressive she would want to be with surveillance or treatment of this.  I discussed options to include EUS with possible EMR of the lesion, versus observation in light of her age and comorbidities.  Discussed that if this a carcinoid, and it does have potential to grow and potentially spread to other parts of her body, however that would hopefully be unlikely given the size of the lesion is small and her age.  After discussion of this issue, the patient adamantly does not want any further endoscopic evaluation, declines EUS/EMR of this prefers to monitor which  I think is reasonable.  I will see her in 1 year for reassessment.  Iron deficiency anemia - she has had this historically dating back several years, EGD and colonoscopy done in 2013 for this issue was unremarkable.  Her most recent Hgb and MCV are normal within the past 6 weeks.  Given her age she is declining further colonoscopy screening and additional endoscopic evaluation which is reasonable.  We will continue to check her hemoglobin periodically and give iron if needed.  Recommend she avoid routine use of NSAIDs moving forward.  History of esophageal candidiasis - treated with fluconazole with complete resolution of symptoms.  She is doing quite well now, has a normal appetite and eating well without symptoms.  She can follow-up as needed with any recurrence.   I spent 30  minutes of time, including in depth chart review, independent review of results as outlined above, communicating results with the patient directly, face-to-face time with the patient, and documenting this encounter.  Ileene Patrick, MD [facility]

## 2019-09-17 NOTE — Telephone Encounter (Signed)
Last refilled: 06/29/2019 Last office visit: 08/12/2019

## 2019-09-22 ENCOUNTER — Ambulatory Visit: Payer: Medicare Other | Admitting: Gastroenterology

## 2019-11-04 ENCOUNTER — Other Ambulatory Visit: Payer: Self-pay | Admitting: Family Medicine

## 2019-11-10 ENCOUNTER — Other Ambulatory Visit: Payer: Self-pay | Admitting: Family Medicine

## 2019-11-28 ENCOUNTER — Other Ambulatory Visit: Payer: Self-pay | Admitting: Family Medicine

## 2019-12-01 ENCOUNTER — Other Ambulatory Visit: Payer: Self-pay | Admitting: Family Medicine

## 2019-12-01 NOTE — Telephone Encounter (Signed)
Pt requesting refill on Tramadol      LOV:  08/12/2019  LRF:   09/17/2019

## 2019-12-02 MED ORDER — TRAMADOL HCL 50 MG PO TABS: ORAL_TABLET | ORAL | 0 refills | Status: DC

## 2019-12-22 ENCOUNTER — Other Ambulatory Visit: Payer: Self-pay | Admitting: Family Medicine

## 2019-12-22 MED ORDER — ROSUVASTATIN CALCIUM 10 MG PO TABS: ORAL_TABLET | ORAL | 0 refills | Status: DC

## 2019-12-28 ENCOUNTER — Other Ambulatory Visit: Payer: Self-pay | Admitting: Family Medicine

## 2020-01-11 ENCOUNTER — Encounter: Payer: Self-pay | Admitting: Nurse Practitioner

## 2020-01-11 ENCOUNTER — Other Ambulatory Visit: Payer: Self-pay

## 2020-01-11 ENCOUNTER — Ambulatory Visit (INDEPENDENT_AMBULATORY_CARE_PROVIDER_SITE_OTHER): Payer: Medicare Other | Admitting: Nurse Practitioner

## 2020-01-11 VITALS — BP 100/72 | HR 69 | Temp 97.6°F | Resp 18 | Ht 64.0 in | Wt 165.6 lb

## 2020-01-11 DIAGNOSIS — E7800 Pure hypercholesterolemia, unspecified: Secondary | ICD-10-CM

## 2020-01-11 DIAGNOSIS — I10 Essential (primary) hypertension: Secondary | ICD-10-CM | POA: Diagnosis not present

## 2020-01-11 DIAGNOSIS — Z23 Encounter for immunization: Secondary | ICD-10-CM

## 2020-01-11 DIAGNOSIS — N289 Disorder of kidney and ureter, unspecified: Secondary | ICD-10-CM

## 2020-01-11 DIAGNOSIS — E538 Deficiency of other specified B group vitamins: Secondary | ICD-10-CM | POA: Diagnosis not present

## 2020-01-11 DIAGNOSIS — Z2821 Immunization not carried out because of patient refusal: Secondary | ICD-10-CM

## 2020-01-11 DIAGNOSIS — Z0001 Encounter for general adult medical examination with abnormal findings: Secondary | ICD-10-CM

## 2020-01-11 DIAGNOSIS — Z0000 Encounter for general adult medical examination without abnormal findings: Secondary | ICD-10-CM

## 2020-01-11 DIAGNOSIS — E119 Type 2 diabetes mellitus without complications: Secondary | ICD-10-CM

## 2020-01-11 HISTORY — DX: Deficiency of other specified B group vitamins: E53.8

## 2020-01-11 NOTE — Patient Instructions (Signed)
1. Follow  Low fat, low cholesterol, low carb, low sugar diet 2. Drink plenty of water 3. Try to exercise at least 4 times per week 20 minutes per day 4. Follow up annually 5. Consider COVID vaccine 6. Tetanus vaccine administered today 7. Labs completed today 8. Referral to opthamology diabetic eye exam due

## 2020-01-11 NOTE — Progress Notes (Signed)
Established Patient Office Visit  Subjective:  Patient ID: Claudia [name], female    DOB: 1941-07-09  Age: 79 y.o. MRN: [id number]  CC:  Chief Complaint  Patient presents with  . Establish Care    HPI Claudia [name] is a 79-year female accompanied by her daughter presenting for her annual physical exam.  The patient has general chronic pain that she takes a daily Ultram for.  She has a history of seasonal allergies, hypertension, diabetes type 2, elevated cholesterol, B12 deficiency, and GERD.  All are controlled and stable.  Today she will complete her lab work.  The patient refuses Covid vaccination, TD vaccine, DEXA scan, the patient had a foot exam in August 2021 will defer today.  The patient reports that over the next month and will complete examination.  Patient denies chest pain, chest tightness,gu/gi symptoms, pain, shortness of breath, edema.   Past Medical History:  Diagnosis Date  . Anemia   . Arthritis   . B12 deficiency 01/11/2020  . Blind right eye   . Diabetes mellitus without complication ([facility])   . GERD (gastroesophageal reflux disease)    chronic gastritis  . Hyperlipidemia   . Hypertension     Past Surgical History:  Procedure Laterality Date  . HERNIA REPAIR    . INDUCED ABORTION      Family History  Problem Relation Age of Onset  . Diabetes Sister   . Diabetes Daughter     Social History   Socioeconomic History  . Marital status: Single    Spouse name: Not on file  . Number of children: 4  . Years of education: Not on file  . Highest education level: Not on file  Occupational History  . Not on file  Tobacco Use  . Smoking status: Former Smoker    Types: Cigarettes    Quit date: 1981    Years since quitting: 40.4  . Smokeless tobacco: Never Used  Substance and Sexual Activity  . Alcohol use: No  . Drug use: No  . Sexual activity: Not Currently  Other Topics Concern  . Not on file  Social History Narrative  . Not on file   Social  Determinants of Health   Financial Resource Strain:   . Difficulty of Paying Living Expenses:   Food Insecurity:   . Worried About Programme researcher, broadcasting/film/video in the Last Year:   . Barista in the Last Year:   Transportation Needs:   . Freight forwarder (Medical):   Marland Kitchen Lack of Transportation (Non-Medical):   Physical Activity:   . Days of Exercise per Week:   . Minutes of Exercise per Session:   Stress:   . Feeling of Stress :   Social Connections:   . Frequency of Communication with Friends and Family:   . Frequency of Social Gatherings with Friends and Family:   . Attends Religious Services:   . Active Member of Clubs or Organizations:   . Attends Banker Meetings:   Marland Kitchen Marital Status:   Intimate Partner Violence:   . Fear of Current or Ex-Partner:   . Emotionally Abused:   Marland Kitchen Physically Abused:   . Sexually Abused:     Outpatient Medications Prior to Visit  Medication Sig Dispense Refill  . amLODipine (NORVASC) 10 MG tablet Take 1 tablet (10 mg total) by mouth daily. 90 tablet 3  . aspirin 81 MG tablet Take 1 tablet (81 mg total) by mouth daily. 30 tablet 11  .  diclofenac sodium (VOLTAREN) 1 % GEL Apply 2 g topically 4 (four) times daily. 100 g 2  . fluconazole (DIFLUCAN) 200 MG tablet Take 1 tablet (200 mg total) by mouth daily. Take 400mg  (2 pills) for 1 dose, then 200mg  (1 pill) for 13 doses 15 tablet 0  . glucose blood (BAYER CONTOUR TEST) test strip Check BS QAM fasting 50 each 5  . loratadine (CLARITIN) 10 MG tablet Take 10 mg by mouth daily.    Marland Kitchen losartan (COZAAR) 100 MG tablet TOME UNA TABLETA TODOS LOS DIAS 90 tablet 1  . metFORMIN (GLUCOPHAGE) 1000 MG tablet TOME UNA TABLETA DOS VECES AL DIA 180 tablet 0  . metoprolol succinate (TOPROL-XL) 25 MG 24 hr tablet TOME UNA TABLETA POR VIA ORAL TODOS LOS DIAS 90 tablet 3  . naproxen (NAPRELAN) 500 MG 24 hr tablet TAKE 1 TABLET BY MOUTH TWICE A DAY WITH A MEAL 60 tablet 5  . pantoprazole (PROTONIX) 40 MG  tablet TOME UNA TABLETA TODOS LOS DIAS 90 tablet 1  . rosuvastatin (CRESTOR) 10 MG tablet TOME UNA TABLETA TODOS LOS DIAS 90 tablet 0  . solifenacin (VESICARE) 10 MG tablet TOME UNA TABLETA TODOS LOS DIAS 90 tablet 1  . sucralfate (CARAFATE) 1 GM/10ML suspension TOME 10ML CUATRO VECES CADA DIA CON COMIDAS Y AL ACOSTARSE 420 mL 0  . traMADol (ULTRAM) 50 MG tablet TOME UNA TABLETA POR VIA ORAL CADA SEIS HORAS CUANDO SEA NECESARIO PARA EL DOLOR 120 tablet 0  . vitamin B-12 (CYANOCOBALAMIN) 1000 MCG tablet Take 1 tablet (1,000 mcg total) by mouth daily. 30 tablet 5   No facility-administered medications prior to visit.    No Known Allergies  ROS Review of Systems  All other systems reviewed and are negative.     Objective:    Physical Exam  Constitutional: She is oriented to person, place, and time. Vital signs are normal. She appears well-developed and well-nourished.  HENT:  Head: Normocephalic.  Right Ear: Hearing and ear canal normal.  Left Ear: Hearing and ear canal normal.  Nose: Nose normal.  Mouth/Throat: Uvula is midline, oropharynx is clear and moist and mucous membranes are normal. Abnormal dentition. Dental caries present.  Eyes: Pupils are equal, round, and reactive to light. Conjunctivae, EOM and lids are normal. Lids are everted and swept, no foreign bodies found.  Neck: No JVD present. Carotid bruit is not present. No thyromegaly present.  Cardiovascular: Normal rate, regular rhythm, S1 normal, S2 normal, normal heart sounds and normal pulses.  Pulmonary/Chest: Effort normal and breath sounds normal.  Abdominal: Soft. Normal appearance, normal aorta and bowel sounds are normal. There is no hepatosplenomegaly. There is no CVA tenderness.  Musculoskeletal:        General: Normal range of motion.     Cervical back: Normal range of motion and neck supple.  Lymphadenopathy:       Head (right side): No submental, no submandibular and no tonsillar adenopathy present.        Head (left side): No submental, no submandibular and no tonsillar adenopathy present.    She has no cervical adenopathy.  Neurological: She is alert and oriented to person, place, and time. She has normal strength and normal reflexes. She displays a negative Romberg sign.  Skin: Skin is warm, dry and intact.  Psychiatric: She has a normal mood and affect. Her speech is normal and behavior is normal. Judgment and thought content normal. Cognition and memory are normal.  Nursing note and vitals reviewed.  BP 100/72 (BP Location: Left Arm, Patient Position: Sitting, Cuff Size: Normal)   Pulse 69   Temp 97.6 F (36.4 C) (Temporal)   Resp 18   Ht 5\' 4"  (1.626 m)   Wt 165 lb 9.6 oz (75.1 kg)   SpO2 98%   BMI 28.43 kg/m  Wt Readings from Last 3 Encounters:  01/11/20 165 lb 9.6 oz (75.1 kg)  09/16/19 153 lb (69.4 kg)  08/19/19 150 lb (68 kg)     Health Maintenance Due  Topic Date Due  . COVID-19 Vaccine (1) Never done  . TETANUS/TDAP  Never done  . DEXA SCAN  Never done  . OPHTHALMOLOGY EXAM  05/27/2018  . HEMOGLOBIN A1C  09/18/2019    There are no preventive care reminders to display for this patient.  No results found for: TSH Lab Results  Component Value Date   WBC 10.1 08/12/2019   HGB 13.1 08/12/2019   HCT 41.4 08/12/2019   MCV 83.1 08/12/2019   PLT 302 08/12/2019   Lab Results  Component Value Date   NA 140 08/12/2019   K 5.1 08/12/2019   CO2 19 (L) 08/12/2019   GLUCOSE 135 (H) 08/12/2019   BUN 31 (H) 08/12/2019   CREATININE 1.56 (H) 08/12/2019   BILITOT 0.5 08/12/2019   ALKPHOS 93 04/01/2017   AST 14 08/12/2019   ALT 7 08/12/2019   PROT 6.5 08/12/2019   ALBUMIN 3.7 04/01/2017   CALCIUM 9.5 08/12/2019   Lab Results  Component Value Date   CHOL 143 03/18/2019   Lab Results  Component Value Date   HDL 45 (L) 03/18/2019   Lab Results  Component Value Date   LDLCALC 80 03/18/2019   Lab Results  Component Value Date   TRIG 93 03/18/2019   Lab  Results  Component Value Date   CHOLHDL 3.2 03/18/2019   Lab Results  Component Value Date   HGBA1C 6.0 (H) 03/18/2019      Assessment & Plan:   Problem List Items Addressed This Visit      Cardiovascular and Mediastinum   Hypertension   Relevant Orders   COMPLETE METABOLIC PANEL WITH GFR   CBC with Differential/Platelet     Other   Hyperlipidemia   Relevant Orders   Lipid panel   B12 deficiency - Primary   Relevant Orders   Vitamin B12    Other Visit Diagnoses    Kidney function abnormal       Relevant Orders   COMPLETE METABOLIC PANEL WITH GFR   Adult general medical examination       Relevant Orders   Hemoglobin A1c   Lipid panel   COMPLETE METABOLIC PANEL WITH GFR   CBC with Differential/Platelet   Vitamin B12   Need for Td vaccine       Tetanus, diphtheria, and acellular pertussis (Tdap) vaccination declined       Controlled type 2 diabetes mellitus without complication, without long-term current use of insulin (HCC)       Relevant Orders   Ambulatory referral to Ophthalmology   Hemoglobin A1c    1. Follow  Low fat, low cholesterol, low carb, low sugar diet 2. Drink plenty of water 3. Try to exercise at least 4 times per week 20 minutes per day 4. Follow up annually 5. Consider COVID vaccine 6. Tetanus vaccine administered today 7. Labs completed today 8. Referral to opthamology diabetic eye exam due  No orders of the defined types were placed in this encounter.  Follow-up: Return in about 6 months (around 07/12/2020).    Elmore Guise, FNP

## 2020-01-12 LAB — CBC WITH DIFFERENTIAL/PLATELET
Absolute Monocytes: 628 cells/uL (ref 200–950)
Basophils Absolute: 60 cells/uL (ref 0–200)
Basophils Relative: 0.7 %
Eosinophils Absolute: 129 cells/uL (ref 15–500)
Eosinophils Relative: 1.5 %
HCT: 43.9 % (ref 35.0–45.0)
Hemoglobin: 13.5 g/dL (ref 11.7–15.5)
Lymphs Abs: 1746 cells/uL (ref 850–3900)
MCH: 26 pg — ABNORMAL LOW (ref 27.0–33.0)
MCHC: 30.8 g/dL — ABNORMAL LOW (ref 32.0–36.0)
MCV: 84.6 fL (ref 80.0–100.0)
MPV: 11.3 fL (ref 7.5–12.5)
Monocytes Relative: 7.3 %
Neutro Abs: 6037 cells/uL (ref 1500–7800)
Neutrophils Relative %: 70.2 %
Platelets: 299 10*3/uL (ref 140–400)
RBC: 5.19 10*6/uL — ABNORMAL HIGH (ref 3.80–5.10)
RDW: 16.5 % — ABNORMAL HIGH (ref 11.0–15.0)
Total Lymphocyte: 20.3 %
WBC: 8.6 10*3/uL (ref 3.8–10.8)

## 2020-01-12 LAB — COMPLETE METABOLIC PANEL WITH GFR
AG Ratio: 1.3 (calc) (ref 1.0–2.5)
ALT: 15 U/L (ref 6–29)
AST: 20 U/L (ref 10–35)
Albumin: 4 g/dL (ref 3.6–5.1)
Alkaline phosphatase (APISO): 71 U/L (ref 37–153)
BUN/Creatinine Ratio: 20 (calc) (ref 6–22)
BUN: 25 mg/dL (ref 7–25)
CO2: 24 mmol/L (ref 20–32)
Calcium: 10.2 mg/dL (ref 8.6–10.4)
Chloride: 106 mmol/L (ref 98–110)
Creat: 1.23 mg/dL — ABNORMAL HIGH (ref 0.60–0.88)
GFR, Est African American: 48 mL/min/{1.73_m2} — ABNORMAL LOW (ref >=60)
GFR, Est Non African American: 41 mL/min/{1.73_m2} — ABNORMAL LOW (ref >=60)
Globulin: 3 g/dL (calc) (ref 1.9–3.7)
Glucose, Bld: 110 mg/dL — ABNORMAL HIGH (ref 65–99)
Potassium: 4.7 mmol/L (ref 3.5–5.3)
Sodium: 141 mmol/L (ref 135–146)
Total Bilirubin: 0.5 mg/dL (ref 0.2–1.2)
Total Protein: 7 g/dL (ref 6.1–8.1)

## 2020-01-12 LAB — HEMOGLOBIN A1C
Hgb A1c MFr Bld: 5.7 % of total Hgb — ABNORMAL HIGH (ref <5.7)
Mean Plasma Glucose: 117 (calc)
eAG (mmol/L): 6.5 (calc)

## 2020-01-12 LAB — LIPID PANEL
Cholesterol: 120 mg/dL (ref <200)
HDL: 45 mg/dL — ABNORMAL LOW (ref >=50)
LDL Cholesterol (Calc): 54 mg/dL (calc)
Non-HDL Cholesterol (Calc): 75 mg/dL (calc) (ref <130)
Total CHOL/HDL Ratio: 2.7 (calc) (ref <5.0)
Triglycerides: 131 mg/dL (ref <150)

## 2020-01-12 LAB — VITAMIN B12: Vitamin B-12: 710 pg/mL (ref 200–1100)

## 2020-01-12 NOTE — Progress Notes (Signed)
Kidney function improved some since 10 months ago prior provider checked.  A1C (diabetes has also improved ) Still showing microcytic anemia which appears to be chronic IDA. I do not suspect that she is B12 deficient as indicated in history and during our visit yesterday. That is a macrocytic anemia and possibly it is not showing on labs due to replacement managent.  I would like her to complete Ifob x 3 and return to lab or office however this clinic does the stool for blood testing Claudia [name]. Just to check for GI bleed for her annual.  I will see her as planned for repeat labs in 6 months and apt.

## 2020-01-24 ENCOUNTER — Other Ambulatory Visit: Payer: Self-pay | Admitting: Nurse Practitioner

## 2020-01-24 MED ORDER — SUCRALFATE 1 GM/10ML PO SUSP: ORAL | 0 refills | Status: DC

## 2020-02-04 ENCOUNTER — Other Ambulatory Visit: Payer: Self-pay | Admitting: *Deleted

## 2020-02-04 DIAGNOSIS — D649 Anemia, unspecified: Secondary | ICD-10-CM

## 2020-02-07 ENCOUNTER — Other Ambulatory Visit: Payer: Self-pay | Admitting: Family Medicine

## 2020-02-08 ENCOUNTER — Other Ambulatory Visit: Payer: Self-pay

## 2020-02-08 MED ORDER — PANTOPRAZOLE SODIUM 40 MG PO TBEC: DELAYED_RELEASE_TABLET | ORAL | 1 refills | Status: DC

## 2020-02-09 DIAGNOSIS — H1811 Bullous keratopathy, right eye: Secondary | ICD-10-CM | POA: Diagnosis not present

## 2020-02-09 DIAGNOSIS — H04123 Dry eye syndrome of bilateral lacrimal glands: Secondary | ICD-10-CM | POA: Diagnosis not present

## 2020-02-09 DIAGNOSIS — H0102A Squamous blepharitis right eye, upper and lower eyelids: Secondary | ICD-10-CM | POA: Diagnosis not present

## 2020-02-09 DIAGNOSIS — H169 Unspecified keratitis: Secondary | ICD-10-CM | POA: Diagnosis not present

## 2020-02-09 DIAGNOSIS — H0102B Squamous blepharitis left eye, upper and lower eyelids: Secondary | ICD-10-CM | POA: Diagnosis not present

## 2020-02-10 DIAGNOSIS — H1811 Bullous keratopathy, right eye: Secondary | ICD-10-CM | POA: Diagnosis not present

## 2020-02-10 DIAGNOSIS — H2512 Age-related nuclear cataract, left eye: Secondary | ICD-10-CM | POA: Diagnosis not present

## 2020-02-10 DIAGNOSIS — S0500XA Injury of conjunctiva and corneal abrasion without foreign body, unspecified eye, initial encounter: Secondary | ICD-10-CM | POA: Diagnosis not present

## 2020-02-10 DIAGNOSIS — H33001 Unspecified retinal detachment with retinal break, right eye: Secondary | ICD-10-CM | POA: Diagnosis not present

## 2020-02-10 DIAGNOSIS — H169 Unspecified keratitis: Secondary | ICD-10-CM | POA: Diagnosis not present

## 2020-02-16 DIAGNOSIS — S0501XA Injury of conjunctiva and corneal abrasion without foreign body, right eye, initial encounter: Secondary | ICD-10-CM | POA: Diagnosis not present

## 2020-02-16 DIAGNOSIS — W1839XA Other fall on same level, initial encounter: Secondary | ICD-10-CM | POA: Diagnosis not present

## 2020-02-17 ENCOUNTER — Other Ambulatory Visit: Payer: Self-pay | Admitting: Family Medicine

## 2020-02-18 ENCOUNTER — Other Ambulatory Visit: Payer: Self-pay | Admitting: Nurse Practitioner

## 2020-02-18 ENCOUNTER — Other Ambulatory Visit: Payer: Self-pay | Admitting: Family Medicine

## 2020-02-22 ENCOUNTER — Other Ambulatory Visit: Payer: Self-pay | Admitting: Nurse Practitioner

## 2020-02-25 ENCOUNTER — Telehealth: Payer: Self-pay | Admitting: Nurse Practitioner

## 2020-02-25 NOTE — Progress Notes (Signed)
  Chronic Care Management   Outreach Note  02/25/2020 Name: Claudia [name] MRN: [id number] DOB: 12-Jan-1941  Referred by: Elmore Guise, FNP Reason for referral : No chief complaint on file.   An unsuccessful telephone outreach was attempted today. The patient was referred to the pharmacist for assistance with care management and care coordination.   Follow Up Plan:   Prathima Theone [employer] Scheduler

## 2020-03-03 DIAGNOSIS — H2512 Age-related nuclear cataract, left eye: Secondary | ICD-10-CM | POA: Diagnosis not present

## 2020-03-04 ENCOUNTER — Other Ambulatory Visit: Payer: Self-pay | Admitting: Nurse Practitioner

## 2020-03-04 ENCOUNTER — Other Ambulatory Visit: Payer: Self-pay | Admitting: Family Medicine

## 2020-03-04 DIAGNOSIS — R0789 Other chest pain: Secondary | ICD-10-CM

## 2020-03-09 ENCOUNTER — Telehealth: Payer: Self-pay | Admitting: Nurse Practitioner

## 2020-03-09 NOTE — Progress Notes (Signed)
Spoke with patient but couldn't understand what I was saying. Please have patient daughter call back to schedule for CCM.  Carley Perdue [employer]

## 2020-03-15 ENCOUNTER — Telehealth: Payer: Self-pay | Admitting: Nurse Practitioner

## 2020-03-15 NOTE — Progress Notes (Signed)
  Chronic Care Management   Outreach Note  03/15/2020 Name: Claudia [name] MRN: [id number] DOB: 05/20/1941  Referred by: Elmore Guise, FNP Reason for referral : No chief complaint on file.   Third unsuccessful telephone outreach was attempted today. The patient was referred to the pharmacist for assistance with care management and care coordination.   Follow Up Plan:   Carley Perdue [employer] Scheduler

## 2020-03-22 ENCOUNTER — Other Ambulatory Visit: Payer: Self-pay | Admitting: Nurse Practitioner

## 2020-03-22 ENCOUNTER — Other Ambulatory Visit: Payer: Self-pay | Admitting: Family Medicine

## 2020-03-22 DIAGNOSIS — I10 Essential (primary) hypertension: Secondary | ICD-10-CM

## 2020-03-23 NOTE — Telephone Encounter (Signed)
Ok to refill??  Last office visit 08/12/2019.  Last refill 12/02/2019.

## 2020-04-05 DIAGNOSIS — H2512 Age-related nuclear cataract, left eye: Secondary | ICD-10-CM | POA: Diagnosis not present

## 2020-04-10 ENCOUNTER — Other Ambulatory Visit: Payer: Self-pay | Admitting: Nurse Practitioner

## 2020-04-10 ENCOUNTER — Other Ambulatory Visit: Payer: Self-pay | Admitting: Family Medicine

## 2020-04-25 DIAGNOSIS — I10 Essential (primary) hypertension: Secondary | ICD-10-CM | POA: Diagnosis not present

## 2020-04-25 DIAGNOSIS — E1136 Type 2 diabetes mellitus with diabetic cataract: Secondary | ICD-10-CM | POA: Diagnosis not present

## 2020-04-25 DIAGNOSIS — H2512 Age-related nuclear cataract, left eye: Secondary | ICD-10-CM | POA: Diagnosis not present

## 2020-04-25 DIAGNOSIS — K219 Gastro-esophageal reflux disease without esophagitis: Secondary | ICD-10-CM | POA: Diagnosis not present

## 2020-04-25 DIAGNOSIS — E119 Type 2 diabetes mellitus without complications: Secondary | ICD-10-CM | POA: Diagnosis not present

## 2020-04-25 DIAGNOSIS — Z7984 Long term (current) use of oral hypoglycemic drugs: Secondary | ICD-10-CM | POA: Diagnosis not present

## 2020-05-11 ENCOUNTER — Other Ambulatory Visit: Payer: Self-pay | Admitting: Family Medicine

## 2020-05-17 ENCOUNTER — Other Ambulatory Visit: Payer: Self-pay

## 2020-05-17 MED ORDER — SUCRALFATE 1 GM/10ML PO SUSP: ORAL | 2 refills | Status: DC

## 2020-05-18 ENCOUNTER — Other Ambulatory Visit: Payer: Self-pay | Admitting: Family Medicine

## 2020-05-18 MED ORDER — METFORMIN HCL 1000 MG PO TABS
500.0000 mg | ORAL_TABLET | Freq: Every day | ORAL | 0 refills | Status: DC
Start: 2020-05-18 — End: 2021-03-23

## 2020-05-18 NOTE — Telephone Encounter (Signed)
Pharmacy:[id number] [place], [place] - [address]   Medication:metFORMIN (GLUCOPHAGE) 1000 MG tablet   Qty:180  XJO:ITGP 0.5 tablets (500 mg total) by mouth daily with breakfast. TOME UNA TABLETA DOS VECES AL DIA  Physician: Dr. Tanya Nones

## 2020-06-30 DIAGNOSIS — H5213 Myopia, bilateral: Secondary | ICD-10-CM | POA: Diagnosis not present

## 2020-07-19 ENCOUNTER — Ambulatory Visit: Payer: Medicare Other | Admitting: Nurse Practitioner

## 2020-07-23 ENCOUNTER — Other Ambulatory Visit: Payer: Self-pay | Admitting: Family Medicine

## 2020-08-15 ENCOUNTER — Other Ambulatory Visit: Payer: Self-pay | Admitting: Family Medicine

## 2020-09-08 ENCOUNTER — Ambulatory Visit: Payer: Medicare Other | Admitting: Family Medicine

## 2020-09-11 ENCOUNTER — Ambulatory Visit (INDEPENDENT_AMBULATORY_CARE_PROVIDER_SITE_OTHER): Payer: Medicare Other | Admitting: Family Medicine

## 2020-09-11 ENCOUNTER — Other Ambulatory Visit: Payer: Self-pay

## 2020-09-11 VITALS — BP 104/60 | HR 65 | Temp 97.0°F | Ht 64.0 in | Wt 131.0 lb

## 2020-09-11 DIAGNOSIS — E7800 Pure hypercholesterolemia, unspecified: Secondary | ICD-10-CM | POA: Diagnosis not present

## 2020-09-11 DIAGNOSIS — I10 Essential (primary) hypertension: Secondary | ICD-10-CM

## 2020-09-11 DIAGNOSIS — E119 Type 2 diabetes mellitus without complications: Secondary | ICD-10-CM | POA: Diagnosis not present

## 2020-09-11 DIAGNOSIS — H524 Presbyopia: Secondary | ICD-10-CM | POA: Diagnosis not present

## 2020-09-11 NOTE — Progress Notes (Signed)
Subjective:    Patient ID: Claudia [name], female    DOB: [birth year], 80 y.o.   MRN: [id number]  Patient is here today for a medicine check.  She is on a combination of amlodipine, losartan, and metoprolol for her blood pressure.  However her systolic blood pressure is very low.  She does report some orthostatic dizziness.  She denies any chest pain shortness of breath or dyspnea on exertion.  She denies any myalgias or right upper quadrant pain.  She denies any polyuria, polydipsia, blurry vision.  Diabetic foot exam was performed today and is completely normal. Past Medical History:  Diagnosis Date  . Anemia   . Arthritis   . B12 deficiency 01/11/2020  . Blind right eye   . Diabetes mellitus without complication (HCC)   . GERD (gastroesophageal reflux disease)    chronic gastritis  . Hyperlipidemia   . Hypertension    Past Surgical History:  Procedure Laterality Date  . HERNIA REPAIR    . INDUCED ABORTION     Current Outpatient Medications on File Prior to Visit  Medication Sig Dispense Refill  . amLODipine (NORVASC) 10 MG tablet TOME UNA TABLETA TODOS LOS DIAS 90 tablet 3  . aspirin 81 MG tablet Take 1 tablet (81 mg total) by mouth daily. 30 tablet 11  . [facility] VITAMIN B12 1000 MCG tablet TOME UNA TABLETA TODOS LOS DIAS 200 tablet 5  . diclofenac sodium (VOLTAREN) 1 % GEL Apply 2 g topically 4 (four) times daily. 100 g 2  . fluconazole (DIFLUCAN) 200 MG tablet Take 1 tablet (200 mg total) by mouth daily. Take 400mg  (2 pills) for 1 dose, then 200mg  (1 pill) for 13 doses 15 tablet 0  . glucose blood (BAYER CONTOUR TEST) test strip Check BS QAM fasting 50 each 5  . loratadine (CLARITIN) 10 MG tablet Take 10 mg by mouth daily.    losartan (COZAAR) 100 MG tablet TOME UNA TABLETA TODOS LOS DIAS 90 tablet 1  . metFORMIN (GLUCOPHAGE) 1000 MG tablet Take 0.5 tablets (500 mg total) by mouth daily with breakfast. TOME UNA TABLETA DOS VECES AL DIA 180 tablet 0  . metoprolol succinate  (TOPROL-XL) 25 MG 24 hr tablet TOME UNA TABLETA POR VIA ORAL TODOS LOS DIAS 90 tablet 3  . naproxen (NAPRELAN) 500 MG 24 hr tablet TAKE 1 TABLET BY MOUTH TWICE A DAY WITH A MEAL 60 tablet 5  . pantoprazole (PROTONIX) 40 MG tablet TOME UNA TABLETA TODOS LOS DIAS 90 tablet 1  . rosuvastatin (CRESTOR) 10 MG tablet TOME UNA TABLETA TODOS LOS DIAS 90 tablet 0  . solifenacin (VESICARE) 10 MG tablet TOME UNA TABLETA TODOS LOS DIAS 90 tablet 1  . sucralfate (CARAFATE) 1 GM/10ML suspension TOME CUATRO VECES CADA DIA CON COMIDAS Y AL ACOSTARSE 420 mL 2  . traMADol (ULTRAM) 50 MG tablet TOME UNA TABLETA POR VIA ORAL CADA SEIS HORAS CUANDO SEA NECESARIO PARA EL DOLOR 28 tablet 4   No current facility-administered medications on file prior to visit.   No Known Allergies Social History   Socioeconomic History  . Marital status: Single    Spouse name: Not on file  . Number of children: 4  . Years of education: Not on file  . Highest education level: Not on file  Occupational History  . Not on file  Tobacco Use  . Smoking status: Former Smoker    Types: Cigarettes    Quit date: 1981    Years since quitting:  41.1  . Smokeless tobacco: Never Used  Vaping Use  . Vaping Use: Never used  Substance and Sexual Activity  . Alcohol use: No  . Drug use: No  . Sexual activity: Not Currently  Other Topics Concern  . Not on file  Social History Narrative  . Not on file   Social Determinants of Health   Financial Resource Strain: Not on file  Food Insecurity: Not on file  Transportation Needs: Not on file  Physical Activity: Not on file  Stress: Not on file  Social Connections: Not on file  Intimate Partner Violence: Not on file     Review of Systems  All other systems reviewed and are negative.      Objective:   Physical Exam Vitals reviewed.  Constitutional:      Appearance: She is well-developed.  Eyes:     Conjunctiva/sclera: Conjunctivae normal.     Pupils: Pupils are equal,  round, and reactive to light.  Neck:     Thyroid: No thyromegaly.     Vascular: No JVD.  Cardiovascular:     Rate and Rhythm: Normal rate and regular rhythm.     Heart sounds: Normal heart sounds. No murmur heard. No friction rub. No gallop.   Pulmonary:     Effort: Pulmonary effort is normal. No respiratory distress.     Breath sounds: Normal breath sounds. No wheezing or rales.  Abdominal:     General: Bowel sounds are normal. There is no distension.     Palpations: Abdomen is soft. There is no mass.     Tenderness: There is no abdominal tenderness. There is no guarding or rebound.  Musculoskeletal:     Cervical back: Neck supple.  Lymphadenopathy:     Cervical: No cervical adenopathy.           Assessment & Plan:  Controlled type 2 diabetes mellitus without complication, without long-term current use of insulin (HCC) - Plan: Hemoglobin A1c, Lipid panel, COMPLETE METABOLIC PANEL WITH GFR, CBC with Differential/Platelet  Essential hypertension  Pure hypercholesterolemia  Blood pressure today is relatively low.  I recommended that they stop amlodipine and recheck blood pressure every day at home.  As long as it is less than 140/90 this is sufficient given her age.  Check a hemoglobin A1c.  Goal hemoglobin A1c is less than 6.5.  Check a fasting lipid panel.  Goal LDL cholesterol is less than 100.  Strongly encourage the patient to get her COVID vaccine as she is unvaccinated.  Also encouraged a flu shot which she deferred at the present time.  Otherwise review of systems is negative.

## 2020-09-12 LAB — CBC WITH DIFFERENTIAL/PLATELET
Absolute Monocytes: 607 cells/uL (ref 200–950)
Basophils Absolute: 44 cells/uL (ref 0–200)
Basophils Relative: 0.5 %
Eosinophils Absolute: 70 cells/uL (ref 15–500)
Eosinophils Relative: 0.8 %
HCT: 40.3 % (ref 35.0–45.0)
Hemoglobin: 13.1 g/dL (ref 11.7–15.5)
Lymphs Abs: 1549 cells/uL (ref 850–3900)
MCH: 27.9 pg (ref 27.0–33.0)
MCHC: 32.5 g/dL (ref 32.0–36.0)
MCV: 85.7 fL (ref 80.0–100.0)
MPV: 11.4 fL (ref 7.5–12.5)
Monocytes Relative: 6.9 %
Neutro Abs: 6530 cells/uL (ref 1500–7800)
Neutrophils Relative %: 74.2 %
Platelets: 266 10*3/uL (ref 140–400)
RBC: 4.7 10*6/uL (ref 3.80–5.10)
RDW: 14.8 % (ref 11.0–15.0)
Total Lymphocyte: 17.6 %
WBC: 8.8 10*3/uL (ref 3.8–10.8)

## 2020-09-12 LAB — COMPLETE METABOLIC PANEL WITH GFR
AG Ratio: 1.5 (calc) (ref 1.0–2.5)
ALT: 9 U/L (ref 6–29)
AST: 16 U/L (ref 10–35)
Albumin: 4.1 g/dL (ref 3.6–5.1)
Alkaline phosphatase (APISO): 65 U/L (ref 37–153)
BUN/Creatinine Ratio: 20 (calc) (ref 6–22)
BUN: 22 mg/dL (ref 7–25)
CO2: 24 mmol/L (ref 20–32)
Calcium: 9.7 mg/dL (ref 8.6–10.4)
Chloride: 107 mmol/L (ref 98–110)
Creat: 1.11 mg/dL — ABNORMAL HIGH (ref 0.60–0.88)
GFR, Est African American: 54 mL/min/{1.73_m2} — ABNORMAL LOW (ref >=60)
GFR, Est Non African American: 47 mL/min/{1.73_m2} — ABNORMAL LOW (ref >=60)
Globulin: 2.8 g/dL (calc) (ref 1.9–3.7)
Glucose, Bld: 120 mg/dL — ABNORMAL HIGH (ref 65–99)
Potassium: 4.2 mmol/L (ref 3.5–5.3)
Sodium: 142 mmol/L (ref 135–146)
Total Bilirubin: 0.5 mg/dL (ref 0.2–1.2)
Total Protein: 6.9 g/dL (ref 6.1–8.1)

## 2020-09-12 LAB — HEMOGLOBIN A1C
Hgb A1c MFr Bld: 5.8 % of total Hgb — ABNORMAL HIGH (ref <5.7)
Mean Plasma Glucose: 120 mg/dL
eAG (mmol/L): 6.6 mmol/L

## 2020-09-12 LAB — LIPID PANEL
Cholesterol: 133 mg/dL (ref <200)
HDL: 51 mg/dL (ref >=50)
LDL Cholesterol (Calc): 62 mg/dL (calc)
Non-HDL Cholesterol (Calc): 82 mg/dL (calc) (ref <130)
Total CHOL/HDL Ratio: 2.6 (calc) (ref <5.0)
Triglycerides: 116 mg/dL (ref <150)

## 2020-09-22 ENCOUNTER — Other Ambulatory Visit: Payer: Self-pay | Admitting: Family Medicine

## 2020-10-05 ENCOUNTER — Other Ambulatory Visit: Payer: Self-pay | Admitting: Family Medicine

## 2020-12-12 ENCOUNTER — Other Ambulatory Visit: Payer: Self-pay | Admitting: *Deleted

## 2020-12-12 MED ORDER — ROSUVASTATIN CALCIUM 10 MG PO TABS: 10.0000 mg | ORAL_TABLET | Freq: Every day | ORAL | 0 refills | Status: DC

## 2020-12-26 ENCOUNTER — Other Ambulatory Visit: Payer: Self-pay | Admitting: Family Medicine

## 2021-01-11 ENCOUNTER — Telehealth: Payer: Self-pay | Admitting: Family Medicine

## 2021-01-11 NOTE — Progress Notes (Signed)
  Chronic Care Management   Note  01/11/2021 Name: Chelise Hanger MRN: [id number] DOB: September 20, 1940  Britny Riel is a 80 y.o. year old female who is a primary care patient of Tanya Nones, Priscille Heidelberg, MD. I reached out to [facility] by phone today in response to a referral sent by Ms. Burna Mortimer PCP, Donita Brooks, MD.   Ms. Ferdinand was given information about Chronic Care Management services today including:  1. CCM service includes personalized support from designated clinical staff supervised by her physician, including individualized plan of care and coordination with other care providers 2. 24/7 contact phone numbers for assistance for urgent and routine care needs. 3. Service will only be billed when office clinical staff spend 20 minutes or more in a month to coordinate care. 4. Only one practitioner may furnish and bill the service in a calendar month. 5. The patient may stop CCM services at any time (effective at the end of the month) by phone call to the office staff.   PEDRO GONZALES verbally agreed to assistance and services provided by embedded care coordination/care management team today.  Follow up plan:   [employer] manager, fast food

## 2021-01-11 NOTE — Chronic Care Management (AMB) (Signed)
  Chronic Care Management   Outreach Note  01/11/2021 Name: Claudia [name] MRN: [id number] DOB: [birth year]  Referred by: Donita Brooks, MD Reason for referral : No chief complaint on file.   An unsuccessful telephone outreach was attempted today. The patient was referred to the pharmacist for assistance with care management and care coordination.   Follow Up Plan:   [name] Upstream Scheduler

## 2021-01-24 ENCOUNTER — Other Ambulatory Visit: Payer: Self-pay | Admitting: *Deleted

## 2021-01-24 MED ORDER — ROSUVASTATIN CALCIUM 10 MG PO TABS: 10.0000 mg | ORAL_TABLET | Freq: Every day | ORAL | 1 refills | Status: DC

## 2021-01-24 MED ORDER — PANTOPRAZOLE SODIUM 40 MG PO TBEC: 40.0000 mg | DELAYED_RELEASE_TABLET | Freq: Every day | ORAL | 1 refills | Status: DC

## 2021-03-06 ENCOUNTER — Other Ambulatory Visit: Payer: Self-pay | Admitting: Family Medicine

## 2021-03-13 ENCOUNTER — Telehealth: Payer: Self-pay | Admitting: Pharmacist

## 2021-03-13 NOTE — Progress Notes (Addendum)
Chronic Care Management Pharmacy Assistant   Name: Claudia [name]  MRN: [id number] DOB: [birth year]  Claudia [name] is an 80 y.o. year old female who presents for his initial CCM visit with the clinical pharmacist.  Reason for Encounter: Chart Prep   Conditions to be addressed/monitored: HTN, GERD, HLD, DM  Primary concerns for visit include: HTN  Recent office visits:  None in the last six months  Recent consult visits:  None in the last six months  Hospital visits:  None in previous 6 months  Medication History: Rosuvastatin 10 mg 90 DS 01/10/21  Medications: Outpatient Encounter Medications as of 03/13/2021  Medication Sig   amLODipine (NORVASC) 10 MG tablet TOME UNA TABLETA TODOS LOS DIAS   aspirin 81 MG tablet Take 1 tablet (81 mg total) by mouth daily.   [facility] VITAMIN B12 1000 MCG tablet TOME UNA TABLETA TODOS LOS DIAS   diclofenac sodium (VOLTAREN) 1 % GEL Apply 2 g topically 4 (four) times daily.   fluconazole (DIFLUCAN) 200 MG tablet Take 1 tablet (200 mg total) by mouth daily. Take 400mg  (2 pills) for 1 dose, then 200mg  (1 pill) for 13 doses   glucose blood (BAYER CONTOUR TEST) test strip Check BS QAM fasting   loratadine (CLARITIN) 10 MG tablet Take 10 mg by mouth daily.   losartan (COZAAR) 100 MG tablet TOME UNA TABLETA TODOS LOS DIAS   metFORMIN (GLUCOPHAGE) 1000 MG tablet Take 0.5 tablets (500 mg total) by mouth daily with breakfast. TOME UNA TABLETA DOS VECES AL DIA   metoprolol succinate (TOPROL-XL) 25 MG 24 hr tablet TOME UNA TABLETA POR VIA ORAL TODOS LOS DIAS   naproxen (NAPRELAN) 500 MG 24 hr tablet TAKE 1 TABLET BY MOUTH TWICE A DAY WITH A MEAL   pantoprazole (PROTONIX) 40 MG tablet Take 1 tablet (40 mg total) by mouth daily. Please translate to Spanish.   rosuvastatin (CRESTOR) 10 MG tablet Take 1 tablet (10 mg total) by mouth daily.   solifenacin (VESICARE) 10 MG tablet TOME UNA TABLETA TODOS LOS DIAS   sucralfate (CARAFATE) 1 GM/10ML suspension TOME  CUATRO VECES CADA DIA CON COMIDAS Y AL ACOSTARSE   traMADol (ULTRAM) 50 MG tablet TOME UNA TABLETA POR VIA ORAL CADA SEIS HORAS CUANDO SEA NECESARIO PARA EL DOLOR   No facility-administered encounter medications on file as of 03/13/2021.   Have you seen any other providers since your last visit? Patients son stated no.  Any changes in your medications or health? Patients son stated no.  Any side effects from any medications? Patients son stated no.  Do you have an symptoms or problems not managed by your medications? Patients son stated her hip pain, they are not giving her anything about her pain.  Any concerns about your health right now? Patients son stated no.  Has your provider asked that you check blood pressure, blood sugar, or follow special diet at home? Patients son stated he monitors her blood pressure and follows a diet.   Do you get any type of exercise on a regular basis? Patients son stated yes he does a little walking, and bed exercises.  Can you think of a goal you would like to reach for your health? Patients son stated he would like to see everything improve within her health.   Do you have any problems getting your medications? Patients son stated no when it comes to cost but he is currently out of rosuvastatin 10 mg and the pharmacy is stating its  too early for a refill. Looked into it and the last refill was on 01/10/21 for 90 DS but her son stated that is not correct they did not give him that many pills.  Is there anything that you would like to discuss during the appointment? Patient stated no.   Please bring medications and supplements to appointment, patients son reminded of his mothers face to face appointment on 03/15/21 at 11 am.   Follow-Up:Pharmacist Review   Hulen Luster, RMA Clinical Pharmacist Assistant [phone]

## 2021-03-14 NOTE — Progress Notes (Signed)
Chronic Care Management Pharmacy Note  03/15/2021 Name:  Claudia [name] MRN:  [id number] DOB:  Jan 10, 1941  Summary: Initial pharmD visit.  Meds discussed and updated.  Based on her current med list, patient was missing losartan and metformin from her daily meds.  Patient's main concern is her hip pain.  Her son mentions sometimes she will take more of her mother meds because she thinks it helps with pain.  Recommendations/Changes made from today's visit: Adherence packaging for safe med administration  Plan: [facility] med packs FU 6 months   Subjective: Claudia [name] is an 80 y.o. year old female who is a primary patient of Pickard, Cammie Mcgee, MD.  The CCM team was consulted for assistance with disease management and care coordination needs.    Engaged with patient by telephone for follow up visit in response to provider referral for pharmacy case management and/or care coordination services.   Consent to Services:  The patient was given the following information about Chronic Care Management services today, agreed to services, and gave verbal consent: 1. CCM service includes personalized support from designated clinical staff supervised by the primary care provider, including individualized plan of care and coordination with other care providers 2. 24/7 contact phone numbers for assistance for urgent and routine care needs. 3. Service will only be billed when office clinical staff spend 20 minutes or more in a month to coordinate care. 4. Only one practitioner may furnish and bill the service in a calendar month. 5.The patient may stop CCM services at any time (effective at the end of the month) by phone call to the office staff. 6. The patient will be responsible for cost sharing (co-pay) of up to 20% of the service fee (after annual deductible is met). Patient agreed to services and consent obtained.  Patient Care Team: Susy Frizzle, MD as PCP - General (Family Medicine) Edythe Clarity, [facility] as Pharmacist (Pharmacist)  Recent office visits:  None in the last six months   Recent consult visits:  None in the last six months   Hospital visits:  None in previous 6 months   Medication History: Rosuvastatin 10 mg 90 DS 01/10/21     Objective:  Lab Results  Component Value Date   CREATININE 1.11 (H) 09/11/2020   BUN 22 09/11/2020   GFRNONAA 47 (L) 09/11/2020   GFRAA 54 (L) 09/11/2020   NA 142 09/11/2020   K 4.2 09/11/2020   CALCIUM 9.7 09/11/2020   CO2 24 09/11/2020   GLUCOSE 120 (H) 09/11/2020    Lab Results  Component Value Date/Time   HGBA1C 5.8 (H) 09/11/2020 11:36 AM   HGBA1C 5.7 (H) 01/11/2020 10:45 AM   MICROALBUR 23.6 03/18/2019 08:50 AM   MICROALBUR 6.0 05/07/2018 11:38 AM    Last diabetic Eye exam: No results found for: HMDIABEYEEXA  Last diabetic Foot exam: No results found for: HMDIABFOOTEX   Lab Results  Component Value Date   CHOL 133 09/11/2020   HDL 51 09/11/2020   LDLCALC 62 09/11/2020   TRIG 116 09/11/2020   CHOLHDL 2.6 09/11/2020    Hepatic Function Latest Ref Rng & Units 09/11/2020 01/11/2020 08/12/2019  Total Protein 6.1 - 8.1 g/dL 6.9 7.0 6.5  Albumin 3.6 - 5.1 g/dL - - -  AST 10 - 35 U/L '16 20 14  ' ALT 6 - 29 U/L '9 15 7  ' Alk Phosphatase 33 - 130 U/L - - -  Total Bilirubin 0.2 - 1.2 mg/dL 0.5 0.5 0.5  No results found for: TSH, FREET4  CBC Latest Ref Rng & Units 09/11/2020 01/11/2020 08/12/2019  WBC 3.8 - 10.8 Thousand/uL 8.8 8.6 10.1  Hemoglobin 11.7 - 15.5 g/dL 13.1 13.5 13.1  Hematocrit 35.0 - 45.0 % 40.3 43.9 41.4  Platelets 140 - 400 Thousand/uL 266 299 302    No results found for: VD25OH  Clinical ASCVD: No  The ASCVD Risk score Mikey Bussing DC Jr., et al., 2013) failed to calculate for the following reasons:   The 2013 ASCVD risk score is only valid for ages 2 to 70    Depression screen PHQ 2/9 01/11/2020 05/07/2018 04/01/2017  Decreased Interest 0 0 0  Down, Depressed, Hopeless 0 0 0  PHQ - 2 Score 0 0 0   Altered sleeping 0 - -  Tired, decreased energy 0 - -  Change in appetite 0 - -  Feeling bad or failure about yourself  0 - -  Trouble concentrating 0 - -  Moving slowly or fidgety/restless 0 - -  Suicidal thoughts 0 - -  PHQ-9 Score 0 - -  Difficult doing work/chores Not difficult at all - -      Social History   Tobacco Use  Smoking Status Former   Types: Cigarettes   Quit date: 1981   Years since quitting: 41.6  Smokeless Tobacco Never   BP Readings from Last 3 Encounters:  09/11/20 104/60  01/11/20 100/72  09/16/19 90/60   Pulse Readings from Last 3 Encounters:  09/11/20 65  01/11/20 69  09/16/19 61   Wt Readings from Last 3 Encounters:  09/11/20 131 lb (59.4 kg)  01/11/20 165 lb 9.6 oz (75.1 kg)  09/16/19 153 lb (69.4 kg)   BMI Readings from Last 3 Encounters:  09/11/20 22.49 kg/m  01/11/20 28.43 kg/m  09/16/19 31.43 kg/m    Assessment/Interventions: Review of patient past medical history, allergies, medications, health status, including review of consultants reports, laboratory and other test data, was performed as part of comprehensive evaluation and provision of chronic care management services.   SDOH:  (Social Determinants of Health) assessments and interventions performed: Yes  Financial Resource Strain: Low Risk    Difficulty of Paying Living Expenses: Not very hard    SDOH Screenings   Alcohol Screen: Not on file  Depression (PHQ2-9): Not on file  Financial Resource Strain: Low Risk    Difficulty of Paying Living Expenses: Not very hard  Food Insecurity: Not on file  Housing: Not on file  Physical Activity: Not on file  Social Connections: Not on file  Stress: Not on file  Tobacco Use: Not on file  Transportation Needs: Not on file    [place]  No Known Allergies  Medications Reviewed Today     Reviewed by Edythe Clarity, [facility] (Pharmacist) on 03/15/21 at 1150  Med List Status: <None>   Medication Order Taking? Sig  Documenting Provider Last Dose Status Informant  amLODipine (NORVASC) 10 MG tablet [id number] Yes TOME UNA TABLETA TODOS LOS DIAS Bates, Crystal A, FNP Taking Active   aspirin 81 MG tablet [id number] Yes Take 1 tablet (81 mg total) by mouth daily. Orlena Sheldon, PA-C Taking Active   [facility] VITAMIN B12 1000 MCG tablet [id number] Yes TOME UNA TABLETA TODOS LOS DIAS Susy Frizzle, MD Taking Active   diclofenac sodium (VOLTAREN) 1 % GEL [id number] Yes Apply 2 g topically 4 (four) times daily. Susy Frizzle, MD Taking Active   fluconazole (DIFLUCAN) 200 MG tablet [id number] Yes  Take 1 tablet (200 mg total) by mouth daily. Take 417m (2 pills) for 1 dose, then 2062m(1 pill) for 13 doses Armbruster, StCarlota RaspberryMD Taking Active   glucose blood (BAYER CONTOUR TEST) test strip [id number] Check BS QAM fasting PiSusy FrizzleMD Taking Active   loratadine (CLARITIN) 10 MG tablet [id number] Take 10 mg by mouth daily. [provider] Taking Active   losartan (COZAAR) 100 MG tablet [id number] TOShawODOS LOS DIAS PiSusy FrizzleMD Taking Active   metFORMIN (GLUCOPHAGE) 1000 MG tablet [id number] Take 0.5 tablets (500 mg total) by mouth daily with breakfast. TOME UNA TABLETA DOS VECES AL DIA PiSusy FrizzleMD Taking Active   metoprolol succinate (TOPROL-XL) 25 MG 24 hr tablet [id number] TOME UNA TABLETA POR VIA ORAL TODOS LOS DIAS Bates, Crystal A, FNP Taking Active   naproxen (NAPRELAN) 500 MG 24 hr tablet [id number] TAKE 1 TABLET BY MOUTH TWICE A DAY WITH A MEAL Pickard, WaCammie McgeeMD Taking Active   pantoprazole (PROTONIX) 40 MG tablet [id number] Take 1 tablet (40 mg total) by mouth daily. Please translate to Spanish. PiSusy FrizzleMD Taking Active   rosuvastatin (CRESTOR) 10 MG tablet [id number] Take 1 tablet (10 mg total) by mouth daily. PiSusy FrizzleMD Taking Active   solifenacin (VESICARE) 10 MG tablet [id number] TOME UNA TABLETA TODOS LOS DIAS  PiSusy FrizzleMD Taking Active   sucralfate (CARAFATE) 1 GM/10ML suspension [id number] TOME 10ML CUSonoraickard, Warren T, MD Taking Active   traMADol (ULTRAM) 50 MG tablet [id number] TOME UNA TABLETA POR VIA ORAL CADA SEIS HORAS CUANDO SEA NESenecaCrNorth RoyaltonFNP Taking Active             Patient Active Problem List   Diagnosis Date Noted   B12 deficiency 01/11/2020   Hypertension    Hyperlipidemia    Diabetes mellitus without complication ([facility] [place]   GERD (gastroesophageal reflux disease)     Immunization History  Administered Date(s) Administered   Influenza,inj,Quad PF,6+ Mos 05/23/2015   Pneumococcal Conjugate-13 04/01/2017   Pneumococcal Polysaccharide-23 06/25/2010    Conditions to be addressed/monitored:  HTN, GERD, DM, HLD  Care Plan : General Pharmacy (Adult)  Updates made by DaEdythe ClarityRPH since 03/15/2021 12:00 AM     Problem: HTN, GERD, DM, HLD   Priority: High  Onset Date: 03/15/2021     Long-Range Goal: Patient-Specific Goal   Start Date: 03/15/2021  Expected End Date: 09/15/2021  This Visit's Progress: On track  Priority: High  Note:   Current Barriers:  Unable to self administer medications as prescribed  Pharmacist Clinical Goal(s):  Patient will achieve adherence to monitoring guidelines and medication adherence to achieve therapeutic efficacy adhere to prescribed medication regimen as evidenced by pill packs contact provider office for questions/concerns as evidenced notation of same in electronic health record through collaboration with PharmD and provider.   Interventions: 1:1 collaboration with PiSusy FrizzleMD regarding development and update of comprehensive plan of care as evidenced by provider attestation and co-signature Inter-disciplinary care team collaboration (see longitudinal plan of care) Comprehensive medication review performed; medication list  updated in electronic medical record  Hypertension  (Status:Goal on track: YES.)   Med Management Intervention: None  (BP goal <140/90) -Controlled -Current treatment: Losartan 10040maily - not taking at this time Metoprolol XL 50m74m  daily Amlodipine 78m daily -Medications previously tried: none noted  -Current home readings: checks periodically " normal per son" -Current dietary habits: eats about two meals per day, her diet is improving -Current exercise habits: minimal, walking hurts her hip -Denies hypotensive/hypertensive symptoms -Educated on BP goals and benefits of medications for prevention of heart attack, stroke and kidney damage; Importance of home blood pressure monitoring; Symptoms of hypotension and importance of maintaining adequate hydration; -Counseled to monitor BP at home a few times per week, document, and provide log at future appointments -Recommended to continue current medication  Hyperlipidemia: (LDL goal < 70) -Controlled -Current treatment: Rosuvastatin 169mdaily -Medications previously tried: none noted  -She was currently out of this medication due to a mix up at the pharmacy.  There has been some confusion on how many tablets she was actually given. -Educated on Cholesterol goals;  Benefits of statin for ASCVD risk reduction; Importance of limiting foods high in cholesterol; -Recommended to continue current medication Will help patient with adherence by setting them up with packaging.  Diabetes (A1c goal <6.5%) -Controlled -Current medications: Metformin 100071malf tablet with breakfast - patient was not taking at this visit -Medications previously tried: none noted  -Current home glucose readings fasting glucose: not checking  post prandial glucose: not checking -Denies hypoglycemic/hyperglycemic symptoms  -Educated on A1c and blood sugar goals; -Counseled to check feet daily and get yearly eye exams -Will consult with PCP if patient  should be taking this med, unclear from chart history.   GERD (Goal: Minimize symptoms) -Controlled -Current treatment  Pantoprazole 15m101mily Sucralfate 10ml32m -Medications previously tried: none noted -Patient will get symptoms if does not take this med.  Currently symptoms are controlled  -Recommended to continue current medication  Patient Goals/Self-Care Activities Patient will:  - focus on medication adherence by pill counts/packs check blood pressure periodically, document, and provide at future appointments Collaborate with provider to get synchronized into adherence packaging.  Follow Up Plan: The care management team will reach out to the patient again over the next 180 days.          Medication Assistance: None required.  Patient affirms current coverage meets needs.  Compliance/Adherence/Medication fill history: Care Gaps: Foot exam Eye exam  Star-Rating Drugs: Rosuvastatin 10 mg 90 DS 01/10/21  Patient's preferred pharmacy is:  [id number] [place] [address] [address]: [phone] pill box? Yes - son organizes Pt endorses 100% compliance  We discussed: Benefits of medication synchronization, packaging and delivery as well as enhanced pharmacist oversight with [facility]. Patient decided to: Utilize [facility] pharmacy for medication synchronization, packaging and delivery Verbal consent obtained for [facility] enhanced pharmacy services (medication synchronization, adherence packaging, delivery coordination). A medication sync plan was created to allow patient to get all medications delivered once every 30 to 90 days per patient preference. Patient understands they have freedom to choose pharmacy and clinical pharmacist will coordinate care between all prescribers and [facility].  Care Plan and Follow Up Patient Decision:   Patient agrees to Care Plan and Follow-up.  Plan: The care management team will reach out to the patient again over the next 180 days.  ChristBeverly MilchmD Clinical Pharmacist [place] [phone]

## 2021-03-15 ENCOUNTER — Ambulatory Visit (INDEPENDENT_AMBULATORY_CARE_PROVIDER_SITE_OTHER): Payer: Medicare Other | Admitting: Pharmacist

## 2021-03-15 ENCOUNTER — Other Ambulatory Visit: Payer: Self-pay

## 2021-03-15 DIAGNOSIS — I10 Essential (primary) hypertension: Secondary | ICD-10-CM

## 2021-03-15 DIAGNOSIS — E7800 Pure hypercholesterolemia, unspecified: Secondary | ICD-10-CM

## 2021-03-15 DIAGNOSIS — E119 Type 2 diabetes mellitus without complications: Secondary | ICD-10-CM | POA: Diagnosis not present

## 2021-03-15 NOTE — Patient Instructions (Addendum)
Visit Information   Goals Addressed             This Visit's Progress    Manage My Medicine       Timeframe:  Long-Range Goal Priority:  High Start Date:03/15/21                             Expected End Date:09/15/21                       Follow Up Date 07/11/21    Work to improve adherence with packaging and synchronization from [facility].   Why is this important?   These steps will help you keep on track with your medicines.   Notes:        Patient Care Plan: General Pharmacy (Adult)     Problem Identified: HTN, GERD, DM, HLD   Priority: High  Onset Date: 03/15/2021     Long-Range Goal: Patient-Specific Goal   Start Date: 03/15/2021  Expected End Date: 09/15/2021  This Visit's Progress: On track  Priority: High  Note:   Current Barriers:  Unable to self administer medications as prescribed  Pharmacist Clinical Goal(s):  Patient will achieve adherence to monitoring guidelines and medication adherence to achieve therapeutic efficacy adhere to prescribed medication regimen as evidenced by pill packs contact provider office for questions/concerns as evidenced notation of same in electronic health record through collaboration with PharmD and provider.   Interventions: 1:1 collaboration with Donita Brooks, MD regarding development and update of comprehensive plan of care as evidenced by provider attestation and co-signature Inter-disciplinary care team collaboration (see longitudinal plan of care) Comprehensive medication review performed; medication list updated in electronic medical record  Hypertension  (Status:Goal on track: YES.)   Med Management Intervention: None  (BP goal <140/90) -Controlled -Current treatment: Losartan 100mg  daily - not taking at this time Metoprolol XL 25mg  daily Amlodipine 10mg  daily -Medications previously tried: none noted  -Current home readings: checks periodically " normal per son" -Current dietary habits: eats about two  meals per day, her diet is improving -Current exercise habits: minimal, walking hurts her hip -Denies hypotensive/hypertensive symptoms -Educated on BP goals and benefits of medications for prevention of heart attack, stroke and kidney damage; Importance of home blood pressure monitoring; Symptoms of hypotension and importance of maintaining adequate hydration; -Counseled to monitor BP at home a few times per week, document, and provide log at future appointments -Recommended to continue current medication  Hyperlipidemia: (LDL goal < 70) -Controlled -Current treatment: Rosuvastatin 10mg  daily -Medications previously tried: none noted  -She was currently out of this medication due to a mix up at the pharmacy.  There has been some confusion on how many tablets she was actually given. -Educated on Cholesterol goals;  Benefits of statin for ASCVD risk reduction; Importance of limiting foods high in cholesterol; -Recommended to continue current medication Will help patient with adherence by setting them up with packaging.  Diabetes (A1c goal <6.5%) -Controlled -Current medications: Metformin 1000mg  half tablet with breakfast - patient was not taking at this visit -Medications previously tried: none noted  -Current home glucose readings fasting glucose: not checking  post prandial glucose: not checking -Denies hypoglycemic/hyperglycemic symptoms  -Educated on A1c and blood sugar goals; -Counseled to check feet daily and get yearly eye exams -Will consult with PCP if patient should be taking this med, unclear from chart history.   GERD (Goal: Minimize symptoms) -Controlled -Current  treatment  Pantoprazole 40mg  daily Sucralfate 34ml qid -Medications previously tried: none noted -Patient will get symptoms if does not take this med.  Currently symptoms are controlled  -Recommended to continue current medication  Patient Goals/Self-Care Activities Patient will:  - focus on  medication adherence by pill counts/packs check blood pressure periodically, document, and provide at future appointments Collaborate with provider to get synchronized into adherence packaging.  Follow Up Plan: The care management team will reach out to the patient again over the next 180 days.         Claudia [name] was given information about Chronic Care Management services today including:  CCM service includes personalized support from designated clinical staff supervised by her physician, including individualized plan of care and coordination with other care providers 24/7 contact phone numbers for assistance for urgent and routine care needs. Standard insurance, coinsurance, copays and deductibles apply for chronic care management only during months in which we provide at least 20 minutes of these services. Most insurances cover these services at 100%, however patients may be responsible for any copay, coinsurance and/or deductible if applicable. This service may help you avoid the need for more expensive face-to-face services. Only one practitioner may furnish and bill the service in a calendar month. The patient may stop CCM services at any time (effective at the end of the month) by phone call to the office staff.  Patient agreed to services and verbal consent obtained.   The patient verbalized understanding of instructions, educational materials, and care plan provided today and agreed to receive a mailed copy of patient instructions, educational materials, and care plan.  Telephone follow up appointment with pharmacy team member scheduled for: 6 months  Alita Chyle, Erroll Luna  [place]

## 2021-03-19 ENCOUNTER — Telehealth: Payer: Self-pay | Admitting: Pharmacist

## 2021-03-19 NOTE — Progress Notes (Signed)
    Chronic Care Management Pharmacy Assistant   Name: Claudia [name]  MRN: [id number] DOB: Jan 05, 1941  Reason for Encounter: CCM Care Plan  Medications: Outpatient Encounter Medications as of 03/19/2021  Medication Sig   amLODipine (NORVASC) 10 MG tablet TOME UNA TABLETA TODOS LOS DIAS   aspirin 81 MG tablet Take 1 tablet (81 mg total) by mouth daily.   [facility] VITAMIN B12 1000 MCG tablet TOME UNA TABLETA TODOS LOS DIAS   diclofenac sodium (VOLTAREN) 1 % GEL Apply 2 g topically 4 (four) times daily.   fluconazole (DIFLUCAN) 200 MG tablet Take 1 tablet (200 mg total) by mouth daily. Take 400mg  (2 pills) for 1 dose, then 200mg  (1 pill) for 13 doses   glucose blood (BAYER CONTOUR TEST) test strip Check BS QAM fasting   loratadine (CLARITIN) 10 MG tablet Take 10 mg by mouth daily.   losartan (COZAAR) 100 MG tablet TOME UNA TABLETA TODOS LOS DIAS   metFORMIN (GLUCOPHAGE) 1000 MG tablet Take 0.5 tablets (500 mg total) by mouth daily with breakfast. TOME UNA TABLETA DOS VECES AL DIA   metoprolol succinate (TOPROL-XL) 25 MG 24 hr tablet TOME UNA TABLETA POR VIA ORAL TODOS LOS DIAS   naproxen (NAPRELAN) 500 MG 24 hr tablet TAKE 1 TABLET BY MOUTH TWICE A DAY WITH A MEAL   pantoprazole (PROTONIX) 40 MG tablet Take 1 tablet (40 mg total) by mouth daily. Please translate to Spanish.   rosuvastatin (CRESTOR) 10 MG tablet Take 1 tablet (10 mg total) by mouth daily.   solifenacin (VESICARE) 10 MG tablet TOME UNA TABLETA TODOS LOS DIAS   sucralfate (CARAFATE) 1 GM/10ML suspension TOME CUATRO VECES CADA DIA CON COMIDAS Y AL ACOSTARSE   traMADol (ULTRAM) 50 MG tablet TOME UNA TABLETA POR VIA ORAL CADA SEIS HORAS CUANDO SEA NECESARIO PARA EL DOLOR   No facility-administered encounter medications on file as of 03/19/2021.   Reviewed the patients initial visit reinsured it was completed per the pharmacist request. Printed the CCM care plan. Mailed the patient CCM care plan to their most recent  address on file.  Follow-Up:Pharmacist Review  05/19/2021, RMA Clinical Pharmacist Assistant [phone]

## 2021-03-23 ENCOUNTER — Telehealth: Payer: Self-pay | Admitting: *Deleted

## 2021-03-23 DIAGNOSIS — I10 Essential (primary) hypertension: Secondary | ICD-10-CM

## 2021-03-23 DIAGNOSIS — R0789 Other chest pain: Secondary | ICD-10-CM

## 2021-03-23 MED ORDER — SOLIFENACIN SUCCINATE 10 MG PO TABS: ORAL_TABLET | ORAL | 1 refills | Status: DC

## 2021-03-23 MED ORDER — AMLODIPINE BESYLATE 10 MG PO TABS: ORAL_TABLET | ORAL | 3 refills | Status: DC

## 2021-03-23 MED ORDER — PANTOPRAZOLE SODIUM 40 MG PO TBEC: 40.0000 mg | DELAYED_RELEASE_TABLET | Freq: Every day | ORAL | 1 refills | Status: DC

## 2021-03-23 MED ORDER — METFORMIN HCL 1000 MG PO TABS: 500.0000 mg | ORAL_TABLET | Freq: Every day | ORAL | 0 refills | Status: DC

## 2021-03-23 MED ORDER — ROSUVASTATIN CALCIUM 10 MG PO TABS: 10.0000 mg | ORAL_TABLET | Freq: Every day | ORAL | 1 refills | Status: DC

## 2021-03-23 MED ORDER — SUCRALFATE 1 GM/10ML PO SUSP: ORAL | 2 refills | Status: DC

## 2021-03-23 MED ORDER — METOPROLOL SUCCINATE ER 25 MG PO TB24: ORAL_TABLET | ORAL | 3 refills | Status: DC

## 2021-03-23 MED ORDER — LOSARTAN POTASSIUM 100 MG PO TABS: ORAL_TABLET | ORAL | 1 refills | Status: DC

## 2021-03-23 NOTE — Telephone Encounter (Signed)
Routine medications sent to pharmacy.  

## 2021-03-23 NOTE — Telephone Encounter (Signed)
-----   Message from Erroll Luna, [facility] sent at 03/23/2021 11:51 AM EDT ----- Elvina Sidle!  We have set Mrs. Burks up with adherence packaging.  If we could get new rx's for all of her meds including the Carafate sent to [facility] that would be great!1  Thanks  Willa Frater, PharmD Clinical Pharmacist [facility] [phone]

## 2021-04-19 ENCOUNTER — Other Ambulatory Visit: Payer: Self-pay

## 2021-04-19 ENCOUNTER — Ambulatory Visit (INDEPENDENT_AMBULATORY_CARE_PROVIDER_SITE_OTHER): Payer: Medicare Other | Admitting: Family Medicine

## 2021-04-19 VITALS — BP 94/60 | HR 68 | Temp 98.0°F | Wt 140.6 lb

## 2021-04-19 DIAGNOSIS — E119 Type 2 diabetes mellitus without complications: Secondary | ICD-10-CM | POA: Diagnosis not present

## 2021-04-19 DIAGNOSIS — Z0001 Encounter for general adult medical examination with abnormal findings: Secondary | ICD-10-CM | POA: Diagnosis not present

## 2021-04-19 DIAGNOSIS — Z0000 Encounter for general adult medical examination without abnormal findings: Secondary | ICD-10-CM

## 2021-04-19 DIAGNOSIS — E7800 Pure hypercholesterolemia, unspecified: Secondary | ICD-10-CM | POA: Diagnosis not present

## 2021-04-19 DIAGNOSIS — I10 Essential (primary) hypertension: Secondary | ICD-10-CM

## 2021-04-19 MED ORDER — TRAMADOL HCL 50 MG PO TABS: 50.0000 mg | ORAL_TABLET | Freq: Four times a day (QID) | ORAL | 4 refills | Status: DC | PRN

## 2021-04-19 NOTE — Progress Notes (Signed)
Subjective:    Patient ID: Claudia [name], female    DOB: 01/17/1941, 80 y.o.   MRN: [id number] Patient is here today for a complete physical exam.  I discontinued metformin and amlodipine at her last visit however this was resumed a few weeks ago by another provider.  Her blood pressure here today is extremely low at 94/60.  She denies any chest pain or shortness of breath or dizziness or syncope however I feel that she can discontinue amlodipine permanently and does not require this.  She has stayed away from the metformin.  She has not been checking her blood sugars.  She denies any polyuria, polydipsia, blurry vision.  Due to her age she does not require colonoscopy or mammogram or Pap smear.  Her most recent immunizations are listed below Immunization History  Administered Date(s) Administered  . Influenza,inj,Quad PF,6+ Mos 05/23/2015  . Pneumococcal Conjugate-13 04/01/2017  . Pneumococcal Polysaccharide-23 06/25/2010   She denies any falls memory loss or depression.  She is here today with her son. Past Medical History:  Diagnosis Date  . Anemia   . Arthritis   . B12 deficiency 01/11/2020  . Blind right eye   . Diabetes mellitus without complication (HCC)   . GERD (gastroesophageal reflux disease)    chronic gastritis  . Hyperlipidemia   . Hypertension    Past Surgical History:  Procedure Laterality Date  . HERNIA REPAIR    . INDUCED ABORTION     Current Outpatient Medications on File Prior to Visit  Medication Sig Dispense Refill  . amLODipine (NORVASC) 10 MG tablet TOME UNA TABLETA TODOS LOS DIAS 90 tablet 3  . aspirin 81 MG tablet Take 1 tablet (81 mg total) by mouth daily. 30 tablet 11  . [facility] VITAMIN B12 1000 MCG tablet TOME UNA TABLETA TODOS LOS DIAS 200 tablet 5  . diclofenac sodium (VOLTAREN) 1 % GEL Apply 2 g topically 4 (four) times daily. 100 g 2  . fluconazole (DIFLUCAN) 200 MG tablet Take 1 tablet (200 mg total) by mouth daily. Take 400mg  (2 pills) for 1 dose,  then 200mg  (1 pill) for 13 doses 15 tablet 0  . glucose blood (BAYER CONTOUR TEST) test strip Check BS QAM fasting 50 each 5  . loratadine (CLARITIN) 10 MG tablet Take 10 mg by mouth daily.    losartan (COZAAR) 100 MG tablet TOME UNA TABLETA TODOS LOS DIAS 90 tablet 1  . metFORMIN (GLUCOPHAGE) 1000 MG tablet Take 0.5 tablets (500 mg total) by mouth daily with breakfast. TOME UNA TABLETA DOS VECES AL DIA 180 tablet 0  . metoprolol succinate (TOPROL-XL) 25 MG 24 hr tablet TOME UNA TABLETA POR VIA ORAL TODOS LOS DIAS 90 tablet 3  . naproxen (NAPRELAN) 500 MG 24 hr tablet TAKE 1 TABLET BY MOUTH TWICE A DAY WITH A MEAL 60 tablet 5  . pantoprazole (PROTONIX) 40 MG tablet Take 1 tablet (40 mg total) by mouth daily. Please translate to Spanish. 90 tablet 1  . rosuvastatin (CRESTOR) 10 MG tablet Take 1 tablet (10 mg total) by mouth daily. 90 tablet 1  . solifenacin (VESICARE) 10 MG tablet TOME UNA TABLETA TODOS LOS DIAS 90 tablet 1  . sucralfate (CARAFATE) 1 GM/10ML suspension TOME CUATRO VECES CADA DIA CON COMIDAS Y AL ACOSTARSE 420 mL 2  . traMADol (ULTRAM) 50 MG tablet TOME UNA TABLETA POR VIA ORAL CADA SEIS HORAS CUANDO SEA NECESARIO PARA EL DOLOR 28 tablet 4   No current facility-administered medications  on file prior to visit.   No Known Allergies Social History   Socioeconomic History  . Marital status: Single    Spouse name: Not on file  . Number of children: 4  . Years of education: Not on file  . Highest education level: Not on file  Occupational History  . Not on file  Tobacco Use  . Smoking status: Former    Types: Cigarettes    Quit date: 1981    Years since quitting: 41.7  . Smokeless tobacco: Never  Vaping Use  . Vaping Use: Never used  Substance and Sexual Activity  . Alcohol use: No  . Drug use: No  . Sexual activity: Not Currently  Other Topics Concern  . Not on file  Social History Narrative  . Not on file   Social Determinants of Health   Financial  Resource Strain: Low Risk   . Difficulty of Paying Living Expenses: Not very hard  Food Insecurity: Not on file  Transportation Needs: Not on file  Physical Activity: Not on file  Stress: Not on file  Social Connections: Not on file  Intimate Partner Violence: Not on file     Review of Systems  All other systems reviewed and are negative.     Objective:   Physical Exam Vitals reviewed.  Constitutional:      General: She is not in acute distress.    Appearance: Normal appearance. She is well-developed and normal weight. She is not ill-appearing, toxic-appearing or diaphoretic.  HENT:     Head: Normocephalic and atraumatic.     Right Ear: Tympanic membrane and ear canal normal. There is no impacted cerumen.     Left Ear: Tympanic membrane and ear canal normal. There is no impacted cerumen.     Nose: Nose normal. No congestion or rhinorrhea.     Mouth/Throat:     Mouth: Mucous membranes are moist.     Pharynx: Oropharynx is clear. No oropharyngeal exudate.  Eyes:     Extraocular Movements: Extraocular movements intact.     Conjunctiva/sclera: Conjunctivae normal.     Pupils: Pupils are equal, round, and reactive to light.  Neck:     Thyroid: No thyromegaly.     Vascular: No carotid bruit or JVD.  Cardiovascular:     Rate and Rhythm: Normal rate and regular rhythm.     Heart sounds: Normal heart sounds. No murmur heard.   No friction rub. No gallop.  Pulmonary:     Effort: Pulmonary effort is normal. No respiratory distress.     Breath sounds: Normal breath sounds. No stridor. No wheezing, rhonchi or rales.  Abdominal:     General: Bowel sounds are normal. There is no distension.     Palpations: Abdomen is soft. There is no mass.     Tenderness: There is no abdominal tenderness. There is no guarding or rebound.  Musculoskeletal:     Cervical back: Neck supple. No tenderness.     Right lower leg: No edema.     Left lower leg: No edema.  Lymphadenopathy:     Cervical: No  cervical adenopathy.  Skin:    Findings: No erythema, lesion or rash.  Neurological:     General: No focal deficit present.     Mental Status: She is alert and oriented to person, place, and time. Mental status is at baseline.     Cranial Nerves: No cranial nerve deficit.     Sensory: No sensory deficit.  Motor: No weakness.     Coordination: Coordination normal.     Gait: Gait normal.     Deep Tendon Reflexes: Reflexes normal.  Psychiatric:        Mood and Affect: Mood normal.        Behavior: Behavior normal.        Thought Content: Thought content normal.        Judgment: Judgment normal.          Assessment & Plan:  Essential hypertension - Plan: CBC with Differential/Platelet, COMPLETE METABOLIC PANEL WITH GFR, Lipid panel, Hemoglobin A1c  Controlled type 2 diabetes mellitus without complication, without long-term current use of insulin (HCC) - Plan: CBC with Differential/Platelet, COMPLETE METABOLIC PANEL WITH GFR, Lipid panel, Hemoglobin A1c  Pure hypercholesterolemia - Plan: CBC with Differential/Platelet, COMPLETE METABOLIC PANEL WITH GFR, Lipid panel, Hemoglobin A1c  Adult general medical examination - Plan: CBC with Differential/Platelet, COMPLETE METABOLIC PANEL WITH GFR, Lipid panel, Hemoglobin A1c Recommended a flu shot, COVID booster, and Shingrix.  Blood pressure is low today.  Discontinue amlodipine.  Check A1c now that she is off all oral hypoglycemic agents.  As long as her A1c is below 7 I am acceptable with this.  Check a fasting lipid panel.  Goal LDL cholesterol is less than 100.  Continue metoprolol and losartan for now.  Discontinue sucralfate as I do not feel that she requires this since she is no longer having any indigestion but she can continue pantoprazole.  Avoid NSAIDs.  She does complain of low back pain and right hip pain and bilateral knee pain secondary to osteoarthritis.  She is taking 3000 mg a day of Tylenol.  I encouraged him to decrease some  of the Tylenol and replace with tramadol 50 mg 1-2 times a day as needed.  There is no falls or depression or memory loss

## 2021-04-20 ENCOUNTER — Other Ambulatory Visit: Payer: Self-pay | Admitting: *Deleted

## 2021-04-20 DIAGNOSIS — E860 Dehydration: Secondary | ICD-10-CM

## 2021-04-20 LAB — COMPLETE METABOLIC PANEL WITH GFR
AG Ratio: 1.5 (calc) (ref 1.0–2.5)
ALT: 6 U/L (ref 6–29)
AST: 12 U/L (ref 10–35)
Albumin: 4 g/dL (ref 3.6–5.1)
Alkaline phosphatase (APISO): 77 U/L (ref 37–153)
BUN/Creatinine Ratio: 18 (calc) (ref 6–22)
BUN: 28 mg/dL — ABNORMAL HIGH (ref 7–25)
CO2: 22 mmol/L (ref 20–32)
Calcium: 9.3 mg/dL (ref 8.6–10.4)
Chloride: 112 mmol/L — ABNORMAL HIGH (ref 98–110)
Creat: 1.54 mg/dL — ABNORMAL HIGH (ref 0.60–0.95)
Globulin: 2.7 g/dL (calc) (ref 1.9–3.7)
Glucose, Bld: 108 mg/dL — ABNORMAL HIGH (ref 65–99)
Potassium: 4.4 mmol/L (ref 3.5–5.3)
Sodium: 142 mmol/L (ref 135–146)
Total Bilirubin: 0.5 mg/dL (ref 0.2–1.2)
Total Protein: 6.7 g/dL (ref 6.1–8.1)
eGFR: 34 mL/min/{1.73_m2} — ABNORMAL LOW (ref >=60)

## 2021-04-20 LAB — CBC WITH DIFFERENTIAL/PLATELET
Absolute Monocytes: 518 cells/uL (ref 200–950)
Basophils Absolute: 52 cells/uL (ref 0–200)
Basophils Relative: 0.7 %
Eosinophils Absolute: 89 cells/uL (ref 15–500)
Eosinophils Relative: 1.2 %
HCT: 39 % (ref 35.0–45.0)
Hemoglobin: 12.1 g/dL (ref 11.7–15.5)
Lymphs Abs: 1510 cells/uL (ref 850–3900)
MCH: 26 pg — ABNORMAL LOW (ref 27.0–33.0)
MCHC: 31 g/dL — ABNORMAL LOW (ref 32.0–36.0)
MCV: 83.7 fL (ref 80.0–100.0)
MPV: 11.3 fL (ref 7.5–12.5)
Monocytes Relative: 7 %
Neutro Abs: 5232 cells/uL (ref 1500–7800)
Neutrophils Relative %: 70.7 %
Platelets: 235 10*3/uL (ref 140–400)
RBC: 4.66 10*6/uL (ref 3.80–5.10)
RDW: 16.1 % — ABNORMAL HIGH (ref 11.0–15.0)
Total Lymphocyte: 20.4 %
WBC: 7.4 10*3/uL (ref 3.8–10.8)

## 2021-04-20 LAB — LIPID PANEL
Cholesterol: 141 mg/dL (ref <200)
HDL: 47 mg/dL — ABNORMAL LOW (ref >=50)
LDL Cholesterol (Calc): 74 mg/dL (calc)
Non-HDL Cholesterol (Calc): 94 mg/dL (calc) (ref <130)
Total CHOL/HDL Ratio: 3 (calc) (ref <5.0)
Triglycerides: 113 mg/dL (ref <150)

## 2021-04-20 LAB — HEMOGLOBIN A1C
Hgb A1c MFr Bld: 6 % of total Hgb — ABNORMAL HIGH (ref <5.7)
Mean Plasma Glucose: 126 mg/dL
eAG (mmol/L): 7 mmol/L

## 2021-04-25 ENCOUNTER — Telehealth: Payer: Self-pay | Admitting: *Deleted

## 2021-04-25 NOTE — Telephone Encounter (Signed)
Received request from pharmacy for PA on Tramadol.  PA submitted.   Dx: Chronic Pain- G89.4, Chronic Back Pain- M54.9  [facility] is reviewing your PA request. Typically an electronic response will be received within 24-72 hours

## 2021-04-26 NOTE — Telephone Encounter (Signed)
Received documentation from insurance that PA was cancelled ans medication is covered.

## 2021-05-14 ENCOUNTER — Telehealth: Payer: Self-pay | Admitting: Pharmacist

## 2021-05-14 NOTE — Progress Notes (Addendum)
    Chronic Care Management Pharmacy Assistant   Name: Emberlyn Burlison  MRN: [id number] DOB: 08/02/1941  Reason for Encounter: Medication Review/Medication Coordination Call  Medications: Outpatient Encounter Medications as of 05/14/2021  Medication Sig   aspirin 81 MG tablet Take 1 tablet (81 mg total) by mouth daily.   [facility] VITAMIN B12 1000 MCG tablet TOME UNA TABLETA TODOS LOS DIAS   diclofenac sodium (VOLTAREN) 1 % GEL Apply 2 g topically 4 (four) times daily.   fluconazole (DIFLUCAN) 200 MG tablet Take 1 tablet (200 mg total) by mouth daily. Take 400mg  (2 pills) for 1 dose, then 200mg  (1 pill) for 13 doses   glucose blood (BAYER CONTOUR TEST) test strip Check BS QAM fasting   loratadine (CLARITIN) 10 MG tablet Take 10 mg by mouth daily.   losartan (COZAAR) 100 MG tablet TOME UNA TABLETA TODOS LOS DIAS   metoprolol succinate (TOPROL-XL) 25 MG 24 hr tablet TOME UNA TABLETA POR VIA ORAL TODOS LOS DIAS   pantoprazole (PROTONIX) 40 MG tablet Take 1 tablet (40 mg total) by mouth daily. Please translate to Spanish.   rosuvastatin (CRESTOR) 10 MG tablet Take 1 tablet (10 mg total) by mouth daily.   solifenacin (VESICARE) 10 MG tablet TOME UNA TABLETA TODOS LOS DIAS   traMADol (ULTRAM) 50 MG tablet Take 1 tablet (50 mg total) by mouth every 6 (six) hours as needed.   No facility-administered encounter medications on file as of 05/14/2021.   Reviewed chart for medication changes ahead of medication coordination call.  No Consults, or hospital visits since last pharmacist visit.  Office Visit:  04/19/21 Dr. 07/14/2021 For hypertension Per note: Discontinue amlodipine. Continue metoprolol and losartan for now.  Discontinue sucralfate as I do not feel that she requires this since she is no longer having any indigestion but she can continue pantoprazole. Avoid NSAIDs.  BP Readings from Last 3 Encounters:  04/19/21 94/60  09/11/20 104/60  01/11/20 100/72    Lab Results  Component Value Date    HGBA1C 6.0 (H) 04/19/2021     Patient obtains medications through Adherence Packaging  30 Days   Patient is due for next adherence delivery on: 05/24/21.  Called patient and reviewed medications and coordinated delivery.  This delivery to include: vitamin b12 100 mcg 1 tablet at breakfast  metoprolol er 25 mg 1 tablet at breakfast  Pantoprazole 40 mg 1 tablet at breakfast  Rosuvastatin 10 mg 1 tablet at breakfast  Solifenacin 10 mg 1 tablet at breakfast  Losartan 100 mg 1 tablet at breakfast   Patient declined the following medications: None  Patient does not need refills at this time.  Confirmed delivery date of 05/24/21, advised patient that pharmacy will contact them the morning of delivery.  Follow-Up:Pharmacist Review  05/26/21, RMA Clinical Pharmacist Assistant [phone]  8 minutes spent in review, coordination, and documentation.  Reviewed by: Hulen Luster, PharmD Clinical Pharmacist [phone]

## 2021-05-25 ENCOUNTER — Other Ambulatory Visit: Payer: Self-pay

## 2021-05-25 ENCOUNTER — Other Ambulatory Visit: Payer: Medicare Other

## 2021-05-25 DIAGNOSIS — E860 Dehydration: Secondary | ICD-10-CM | POA: Diagnosis not present

## 2021-05-26 LAB — BASIC METABOLIC PANEL
BUN/Creatinine Ratio: 17 (calc) (ref 6–22)
BUN: 23 mg/dL (ref 7–25)
CO2: 23 mmol/L (ref 20–32)
Calcium: 9 mg/dL (ref 8.6–10.4)
Chloride: 109 mmol/L (ref 98–110)
Creat: 1.33 mg/dL — ABNORMAL HIGH (ref 0.60–0.95)
Glucose, Bld: 165 mg/dL — ABNORMAL HIGH (ref 65–99)
Potassium: 4.6 mmol/L (ref 3.5–5.3)
Sodium: 141 mmol/L (ref 135–146)

## 2021-06-12 ENCOUNTER — Telehealth: Payer: Self-pay | Admitting: Pharmacist

## 2021-06-12 NOTE — Progress Notes (Addendum)
    Chronic Care Management Pharmacy Assistant   Name: Claudia [name]  MRN: [id number] DOB: September 20, 1940  Reason for Encounter: Medication Review/Medication Coordination Call  Medications: Outpatient Encounter Medications as of 06/12/2021  Medication Sig   aspirin 81 MG tablet Take 1 tablet (81 mg total) by mouth daily.   [facility]S VITAMIN B12 1000 MCG tablet TOME UNA TABLETA TODOS LOS DIAS   diclofenac sodium (VOLTAREN) 1 % GEL Apply 2 g topically 4 (four) times daily.   fluconazole (DIFLUCAN) 200 MG tablet Take 1 tablet (200 mg total) by mouth daily. Take 400mg  (2 pills) for 1 dose, then 200mg  (1 pill) for 13 doses   glucose blood (BAYER CONTOUR TEST) test strip Check BS QAM fasting   loratadine (CLARITIN) 10 MG tablet Take 10 mg by mouth daily.   losartan (COZAAR) 100 MG tablet TOME UNA TABLETA TODOS LOS DIAS   metoprolol succinate (TOPROL-XL) 25 MG 24 hr tablet TOME UNA TABLETA POR VIA ORAL TODOS LOS DIAS   pantoprazole (PROTONIX) 40 MG tablet Take 1 tablet (40 mg total) by mouth daily. Please translate to Spanish.   rosuvastatin (CRESTOR) 10 MG tablet Take 1 tablet (10 mg total) by mouth daily.   solifenacin (VESICARE) 10 MG tablet TOME UNA TABLETA TODOS LOS DIAS   traMADol (ULTRAM) 50 MG tablet Take 1 tablet (50 mg total) by mouth every 6 (six) hours as needed.   No facility-administered encounter medications on file as of 06/12/2021.   Reviewed chart for medication changes ahead of medication coordination call.  No OVs, Consults, or hospital visits since last care coordination call.  No medication changes indicated.  BP Readings from Last 3 Encounters:  04/19/21 94/60  09/11/20 104/60  01/11/20 100/72    Lab Results  Component Value Date   HGBA1C 6.0 (H) 04/19/2021     Patient obtains medications through Adherence Packaging  30 Days   Last adherence delivery included:  vitamin b12 100 mcg 1 tablet at breakfast  metoprolol er 25 mg 1 tablet at breakfast  Pantoprazole 40 mg 1  tablet at breakfast  Rosuvastatin 10 mg 1 tablet at breakfast  Solifenacin 10 mg 1 tablet at breakfast  Losartan 100 mg 1 tablet at breakfast   Patient declined meds last month: None  Patient is due for next adherence delivery on: 06/22/21.  Called patient and reviewed medications and coordinated delivery.  This delivery to include: vitamin b12 100 mcg 1 tablet at breakfast  metoprolol er 25 mg 1 tablet at breakfast  Pantoprazole 40 mg 1 tablet at breakfast  Rosuvastatin 10 mg 1 tablet at breakfast  Solifenacin 10 mg 1 tablet at breakfast  Losartan 100 mg 1 tablet at breakfast   Patient declined the following medications: Tramadol 50 mg 1 tab every 6 hours as needed (enough on hand)  Patient does not need refills at this time.  Confirmed delivery date of 06/22/21, advised patient that pharmacy will contact them the morning of delivery.  Follow-Up:Pharmacist Review  13/11/22, RMA Clinical Pharmacist Assistant [phone],  12 minutes spent in review, coordination, and documentation.  Reviewed by: Hulen Luster, PharmD Clinical Pharmacist [phone]

## 2021-07-11 ENCOUNTER — Telehealth: Payer: Self-pay | Admitting: Pharmacist

## 2021-07-11 NOTE — Progress Notes (Addendum)
Chronic Care Management Pharmacy Assistant   Name: Claudia [name]  MRN: [id number] DOB: [birth year]   Reason for Encounter: Monthly Medication Coordination Call    Recent office visits:  None noted.  Recent consult visits:  None noted.  Hospital visits:  None in previous 6 months  Medications: Outpatient Encounter Medications as of 07/11/2021  Medication Sig   aspirin 81 MG tablet Take 1 tablet (81 mg total) by mouth daily.   [facility]S VITAMIN B12 1000 MCG tablet TOME UNA TABLETA TODOS LOS DIAS   diclofenac sodium (VOLTAREN) 1 % GEL Apply 2 g topically 4 (four) times daily.   fluconazole (DIFLUCAN) 200 MG tablet Take 1 tablet (200 mg total) by mouth daily. Take 400mg  (2 pills) for 1 dose, then 200mg  (1 pill) for 13 doses   glucose blood (BAYER CONTOUR TEST) test strip Check BS QAM fasting   loratadine (CLARITIN) 10 MG tablet Take 10 mg by mouth daily.   losartan (COZAAR) 100 MG tablet TOME UNA TABLETA TODOS LOS DIAS   metoprolol succinate (TOPROL-XL) 25 MG 24 hr tablet TOME UNA TABLETA POR VIA ORAL TODOS LOS DIAS   pantoprazole (PROTONIX) 40 MG tablet Take 1 tablet (40 mg total) by mouth daily. Please translate to Spanish.   rosuvastatin (CRESTOR) 10 MG tablet Take 1 tablet (10 mg total) by mouth daily.   solifenacin (VESICARE) 10 MG tablet TOME UNA TABLETA TODOS LOS DIAS   traMADol (ULTRAM) 50 MG tablet Take 1 tablet (50 mg total) by mouth every 6 (six) hours as needed.   No facility-administered encounter medications on file as of 07/11/2021.    Reviewed chart for medication changes ahead of medication coordination call.  No OVs, Consults, or hospital visits since last care coordination call/Pharmacist visit. (If appropriate, list visit date, provider name)  No medication changes indicated OR if recent visit, treatment plan here.  BP Readings from Last 3 Encounters:  04/19/21 94/60  09/11/20 104/60  01/11/20 100/72    Lab Results  Component Value Date   HGBA1C 6.0 (H)  04/19/2021     Patient obtains medications through Vials  30 Days   Last adherence delivery included: (medication name and frequency)  vitamin b12 100 mcg 1 tablet at breakfast  metoprolol er 25 mg 1 tablet at breakfast  Pantoprazole 40 mg 1 tablet at breakfast  Rosuvastatin 10 mg 1 tablet at breakfast  Solifenacin 10 mg 1 tablet at breakfast  Losartan 100 mg 1 tablet at breakfast   Patient declined the following medications: Tramadol 50 mg 1 tab every 6 hours as needed (enough on hand)  Patient is due for next adherence delivery on: 07/24/21. Called patient and reviewed medications and coordinated delivery.  This delivery to include:  vitamin b12 100 mcg 1 tablet at breakfast  metoprolol er 25 mg 1 tablet at breakfast  Pantoprazole 40 mg 1 tablet at breakfast  Rosuvastatin 10 mg 1 tablet at breakfast  Solifenacin 10 mg 1 tablet at breakfast  Losartan 100 mg 1 tablet at breakfast    Patient declined the following medications (meds) due to (reason) Tramadol 50 mg 1 tab every 6 hours as needed (enough on hand)  Patient needs refills for None  Confirmed delivery date of 07/24/2021, advised patient that pharmacy will contact them the morning of delivery.   Care Gaps  AWV: overdue Colonoscopy: unknown 26 (Aged out) DM Eye Exam: overdue 02/09/21 DM Foot Exam: due 04/19/22 Microalbumin: done 03/18/2019 HbgAIC: done 04/19/21 (6.0) DEXA: unknown Mammogram: unknown  Star Rating Drugs: rosuvastatin (CRESTOR) 10 MG tablet - last filled 05/17/21 losartan (COZAAR) 100 MG tablet - last filled 05/17/21 30 days   Future Appointments  Date Time Provider Department Center  09/20/2021  3:30 PM [facility] PHARMACIST [facility] None   Spoke to patient's son who is in charge of patients medication and confirmed no changes and would like exact delivery as previous month.   Eugenie Filler, CCMA Clinical Pharmacist Assistant  [phone] 11 minutes spent in review, coordination, and  documentation.  Reviewed by: Willa Frater, PharmD Clinical Pharmacist [phone]

## 2021-08-14 ENCOUNTER — Telehealth: Payer: Self-pay | Admitting: Pharmacist

## 2021-08-14 ENCOUNTER — Other Ambulatory Visit: Payer: Self-pay

## 2021-08-14 MED ORDER — LOSARTAN POTASSIUM 100 MG PO TABS: ORAL_TABLET | ORAL | 1 refills | Status: DC

## 2021-08-14 NOTE — Chronic Care Management (AMB) (Addendum)
Chronic Care Management Pharmacy Assistant   Name: Claudia [name]  MRN: [id number] DOB: 11/06/1940   Reason for Encounter: Medication Coordination Call    Recent office visits:  None  Recent consult visits:  None  Hospital visits:  None in previous 6 months  Medications: Outpatient Encounter Medications as of 08/14/2021  Medication Sig   aspirin 81 MG tablet Take 1 tablet (81 mg total) by mouth daily.   [facility]S VITAMIN B12 1000 MCG tablet TOME UNA TABLETA TODOS LOS DIAS   diclofenac sodium (VOLTAREN) 1 % GEL Apply 2 g topically 4 (four) times daily.   fluconazole (DIFLUCAN) 200 MG tablet Take 1 tablet (200 mg total) by mouth daily. Take 400mg  (2 pills) for 1 dose, then 200mg  (1 pill) for 13 doses   glucose blood (BAYER CONTOUR TEST) test strip Check BS QAM fasting   loratadine (CLARITIN) 10 MG tablet Take 10 mg by mouth daily.   losartan (COZAAR) 100 MG tablet TOME UNA TABLETA TODOS LOS DIAS   metoprolol succinate (TOPROL-XL) 25 MG 24 hr tablet TOME UNA TABLETA POR VIA ORAL TODOS LOS DIAS   pantoprazole (PROTONIX) 40 MG tablet Take 1 tablet (40 mg total) by mouth daily. Please translate to Spanish.   rosuvastatin (CRESTOR) 10 MG tablet Take 1 tablet (10 mg total) by mouth daily.   solifenacin (VESICARE) 10 MG tablet TOME UNA TABLETA TODOS LOS DIAS   traMADol (ULTRAM) 50 MG tablet Take 1 tablet (50 mg total) by mouth every 6 (six) hours as needed.   No facility-administered encounter medications on file as of 08/14/2021.   Reviewed chart for medication changes ahead of medication coordination call.  No OVs, Consults, or hospital visits since last care coordination call/Pharmacist visit. (If appropriate, list visit date, provider name)  No medication changes indicated OR if recent visit, treatment plan here.  BP Readings from Last 3 Encounters:  04/19/21 94/60  09/11/20 104/60  01/11/20 100/72    Lab Results  Component Value Date   HGBA1C 6.0 (H) 04/19/2021     Patient  obtains medications through Adherence Packaging  30 Days   Last adherence delivery included: Vitamin B12 100 mcg 1 tablet at breakfast  Metoprolol ER 25 mg 1 tablet at breakfast  Pantoprazole 40 mg 1 tablet at breakfast  Rosuvastatin 10 mg 1 tablet at breakfast  Solifenacin 10 mg 1 tablet at breakfast  Losartan 100 mg 1 tablet at breakfast   Patient is due for next adherence delivery on: 08/22/2021. Called patient and reviewed medications and coordinated delivery.  This delivery to include: Vitamin B12 100 mcg 1 tablet at breakfast  Metoprolol ER 25 mg 1 tablet at breakfast  Pantoprazole 40 mg 1 tablet at breakfast  Rosuvastatin 10 mg 1 tablet at breakfast  Solifenacin 10 mg 1 tablet at breakfast  Losartan 100 mg 1 tablet at breakfast   Patient needs refills for Losartan - Rx refill request sent to [facility].  Confirmed delivery date of 08/22/2021, advised patient that pharmacy will contact them the morning of delivery.    Care Gaps: Medicare Annual Wellness: Due now Ophthalmology Exam: Overdue since 02/09/2021 Hemoglobin A1C: 6.0% on 04/19/2021 Dexa Scan: Overdue - never done Mammogram: Aged out  -I briefly spoke with the patient's son about her overdue care gaps. He states he will schedule a medicare annual wellness at a different time. He will address her other over due care gaps at her next office visit with her pcp.  Future Appointments  Date Time Provider  Department Center  09/20/2021  3:30 PM [facility] PHARMACIST [facility] None   Star Rating Drugs: Losartan 100 mg last filled 06/18/2021 30 DS Rosuvastatin 10 mg last filled 06/18/2021 30 DS  April D Calhoun, [facility] Clinical Pharmacist Assistant [phone]   10 minutes spent in review, coordination, and documentation.  Reviewed by: Willa Frater, PharmD Clinical Pharmacist [phone]

## 2021-09-10 ENCOUNTER — Telehealth: Payer: Self-pay | Admitting: Pharmacist

## 2021-09-10 NOTE — Chronic Care Management (AMB) (Addendum)
° ° °  Chronic Care Management Pharmacy Assistant   Name: Claudia [name]  MRN: [id number] DOB: [birth year]   Reason for Encounter: Medication Coordination Call    Recent office visits:  None  Recent consult visits:  None  Hospital visits:  None in previous 6 months  Medications: Outpatient Encounter Medications as of 09/10/2021  Medication Sig   aspirin 81 MG tablet Take 1 tablet (81 mg total) by mouth daily.   [facility] VITAMIN B12 1000 MCG tablet TOME UNA TABLETA TODOS LOS DIAS   diclofenac sodium (VOLTAREN) 1 % GEL Apply 2 g topically 4 (four) times daily.   fluconazole (DIFLUCAN) 200 MG tablet Take 1 tablet (200 mg total) by mouth daily. Take 400mg  (2 pills) for 1 dose, then 200mg  (1 pill) for 13 doses   glucose blood (BAYER CONTOUR TEST) test strip Check BS QAM fasting   loratadine (CLARITIN) 10 MG tablet Take 10 mg by mouth daily.   losartan (COZAAR) 100 MG tablet TOME UNA TABLETA TODOS LOS DIAS   metoprolol succinate (TOPROL-XL) 25 MG 24 hr tablet TOME UNA TABLETA POR VIA ORAL TODOS LOS DIAS   pantoprazole (PROTONIX) 40 MG tablet Take 1 tablet (40 mg total) by mouth daily. Please translate to Spanish.   rosuvastatin (CRESTOR) 10 MG tablet Take 1 tablet (10 mg total) by mouth daily.   solifenacin (VESICARE) 10 MG tablet TOME UNA TABLETA TODOS LOS DIAS   traMADol (ULTRAM) 50 MG tablet Take 1 tablet (50 mg total) by mouth every 6 (six) hours as needed.   No facility-administered encounter medications on file as of 09/10/2021.   Reviewed chart for medication changes ahead of medication coordination call.  BP Readings from Last 3 Encounters:  04/19/21 94/60  09/11/20 104/60  01/11/20 100/72    Lab Results  Component Value Date   HGBA1C 6.0 (H) 04/19/2021     Patient obtains medications through Adherence Packaging  30 Days   Last adherence delivery included:  Vitamin B12 100 mcg 1 tablet at breakfast  Metoprolol ER 25 mg 1 tablet at breakfast  Pantoprazole 40 mg 1 tablet at  breakfast  Rosuvastatin 10 mg 1 tablet at breakfast  Losartan 100 mg 1 tablet at breakfast   Patient is due for next adherence delivery on: 09/20/2021. Called patient and reviewed medications and coordinated delivery.  This delivery to include: Vitamin B12 100 mcg 1 tablet at breakfast  Metoprolol ER 25 mg 1 tablet at breakfast  Pantoprazole 40 mg 1 tablet at breakfast  Rosuvastatin 10 mg 1 tablet at breakfast  Losartan 100 mg 1 tablet at breakfast   Patient needs refills for Rosuvastatin. -Rx refill request sent to [facility].  Confirmed delivery date of 09/20/2021, advised patient that pharmacy will contact them the morning of delivery.   Care Gaps: Medicare Annual Wellness: Due now - declines to schedule Ophthalmology Exam: Overdue since 02/09/2021 Hemoglobin A1C: 6.0% on 04/19/2021 Colonoscopy: Aged out Dexa Scan: Overdue - never done Mammogram: Aged out  Future Appointments  Date Time Provider [place]  09/20/2021  3:30 PM [facility] PHARMACIST [facility] None    April D Calhoun, [place] Pharmacist Assistant [phone]   10 minutes spent in review, coordination, and documentation.  Reviewed by: Beverly Milch, PharmD Clinical Pharmacist [phone]

## 2021-09-11 ENCOUNTER — Other Ambulatory Visit: Payer: Self-pay

## 2021-09-11 MED ORDER — ROSUVASTATIN CALCIUM 10 MG PO TABS: 10.0000 mg | ORAL_TABLET | Freq: Every day | ORAL | 3 refills | Status: DC

## 2021-09-14 NOTE — Progress Notes (Signed)
Chronic Care Management Pharmacy Note  09/20/2021 Name:  Claudia [name] MRN:  [id number] DOB:  [birth year]  Summary: Meds reviewed.  Patient continued to take losartan. Recommend BMP to check Cr.  Subjective: Claudia [name] is an 81 y.o. year old female who is a primary patient of Pickard, Cammie Mcgee, MD.  The CCM team was consulted for assistance with disease management and care coordination needs.    Engaged with patient by telephone for follow up visit in response to provider referral for pharmacy case management and/or care coordination services.   Consent to Services:  The patient was given the following information about Chronic Care Management services today, agreed to services, and gave verbal consent: 1. CCM service includes personalized support from designated clinical staff supervised by the primary care provider, including individualized plan of care and coordination with other care providers 2. 24/7 contact phone numbers for assistance for urgent and routine care needs. 3. Service will only be billed when office clinical staff spend 20 minutes or more in a month to coordinate care. 4. Only one practitioner may furnish and bill the service in a calendar month. 5.The patient may stop CCM services at any time (effective at the end of the month) by phone call to the office staff. 6. The patient will be responsible for cost sharing (co-pay) of up to 20% of the service fee (after annual deductible is met). Patient agreed to services and consent obtained.  Patient Care Team: Susy Frizzle, MD as PCP - General (Family Medicine) Edythe Clarity, [facility] as Pharmacist (Pharmacist)    Recent office visits:  None   Recent consult visits:  None   Hospital visits:  None in previous 6 months       Objective:  Lab Results  Component Value Date   CREATININE 1.33 (H) 05/25/2021   BUN 23 05/25/2021   GFRNONAA 47 (L) 09/11/2020   GFRAA 54 (L) 09/11/2020   NA 141 05/25/2021   K 4.6  05/25/2021   CALCIUM 9.0 05/25/2021   CO2 23 05/25/2021   GLUCOSE 165 (H) 05/25/2021    Lab Results  Component Value Date/Time   HGBA1C 6.0 (H) 04/19/2021 11:01 AM   HGBA1C 5.8 (H) 09/11/2020 11:36 AM   MICROALBUR 23.6 03/18/2019 08:50 AM   MICROALBUR 6.0 05/07/2018 11:38 AM    Last diabetic Eye exam: No results found for: HMDIABEYEEXA  Last diabetic Foot exam: No results found for: HMDIABFOOTEX   Lab Results  Component Value Date   CHOL 141 04/19/2021   HDL 47 (L) 04/19/2021   LDLCALC 74 04/19/2021   TRIG 113 04/19/2021   CHOLHDL 3.0 04/19/2021    Hepatic Function Latest Ref Rng & Units 04/19/2021 09/11/2020 01/11/2020  Total Protein 6.1 - 8.1 g/dL 6.7 6.9 7.0  Albumin 3.6 - 5.1 g/dL - - -  AST 10 - 35 U/L '12 16 20  ' ALT 6 - 29 U/L '6 9 15  ' Alk Phosphatase 33 - 130 U/L - - -  Total Bilirubin 0.2 - 1.2 mg/dL 0.5 0.5 0.5    No results found for: TSH, FREET4  CBC Latest Ref Rng & Units 04/19/2021 09/11/2020 01/11/2020  WBC 3.8 - 10.8 Thousand/uL 7.4 8.8 8.6  Hemoglobin 11.7 - 15.5 g/dL 12.1 13.1 13.5  Hematocrit 35.0 - 45.0 % 39.0 40.3 43.9  Platelets 140 - 400 Thousand/uL 235 266 299    No results found for: VD25OH  Clinical ASCVD: No  The ASCVD Risk score (Arnett DK, et al., 2019)  failed to calculate for the following reasons:   The 2019 ASCVD risk score is only valid for ages 69 to 26    Depression screen PHQ 2/9 01/11/2020 05/07/2018 04/01/2017  Decreased Interest 0 0 0  Down, Depressed, Hopeless 0 0 0  PHQ - 2 Score 0 0 0  Altered sleeping 0 - -  Tired, decreased energy 0 - -  Change in appetite 0 - -  Feeling bad or failure about yourself  0 - -  Trouble concentrating 0 - -  Moving slowly or fidgety/restless 0 - -  Suicidal thoughts 0 - -  PHQ-9 Score 0 - -  Difficult doing work/chores Not difficult at all - -      Social History   Tobacco Use  Smoking Status Former   Types: Cigarettes   Quit date: 1981   Years since quitting: 42.1  Smokeless Tobacco Never    BP Readings from Last 3 Encounters:  04/19/21 94/60  09/11/20 104/60  01/11/20 100/72   Pulse Readings from Last 3 Encounters:  04/19/21 68  09/11/20 65  01/11/20 69   Wt Readings from Last 3 Encounters:  04/19/21 140 lb 9.6 oz (63.8 kg)  09/11/20 131 lb (59.4 kg)  01/11/20 165 lb 9.6 oz (75.1 kg)   BMI Readings from Last 3 Encounters:  04/19/21 24.13 kg/m  09/11/20 22.49 kg/m  01/11/20 28.43 kg/m    Assessment/Interventions: Review of patient past medical history, allergies, medications, health status, including review of consultants reports, laboratory and other test data, was performed as part of comprehensive evaluation and provision of chronic care management services.   SDOH:  (Social Determinants of Health) assessments and interventions performed: Yes  Financial Resource Strain: Low Risk    Difficulty of Paying Living Expenses: Not very hard    SDOH Screenings   Alcohol Screen: Not on file  Depression (PHQ2-9): Not on file  Financial Resource Strain: Low Risk    Difficulty of Paying Living Expenses: Not very hard  Food Insecurity: Not on file  Housing: Not on file  Physical Activity: Not on file  Social Connections: Not on file  Stress: Not on file  Tobacco Use: Not on file  Transportation Needs: Not on file    [place]  No Known Allergies  Medications Reviewed Today     Reviewed by Edythe Clarity, [facility] (Pharmacist) on 09/20/21 at [place] List Status: <None>   Medication Order Taking? Sig Documenting Provider Last Dose Status Informant  aspirin 81 MG tablet [id number] Yes Take 1 tablet (81 mg total) by mouth daily. Orlena Sheldon, PA-C Taking Active   [facility] VITAMIN B12 1000 MCG tablet [id number] Yes TOME UNA TABLETA TODOS LOS DIAS Susy Frizzle, MD Taking Active   diclofenac sodium (VOLTAREN) 1 % GEL [id number] Yes Apply 2 g topically 4 (four) times daily. Susy Frizzle, MD Taking Active   fluconazole (DIFLUCAN) 200 MG tablet [id number]  Yes Take 1 tablet (200 mg total) by mouth daily. Take 448m (2 pills) for 1 dose, then 2082m(1 pill) for 13 doses Armbruster, StCarlota RaspberryMD Taking Active   glucose blood (BAYER CONTOUR TEST) test strip [id number] Check BS QAM fasting PiSusy FrizzleMD Taking Active   loratadine (CLARITIN) 10 MG tablet [id number] Take 10 mg by mouth daily. [provider] Taking Active   losartan (COZAAR) 100 MG tablet [id number] TOME UNA TABLETA TODOS LOS DIAS PiSusy FrizzleMD Taking Active   metoprolol  succinate (TOPROL-XL) 25 MG 24 hr tablet [id number] Yes TOME UNA TABLETA POR VIA ORAL TODOS LOS DIAS Susy Frizzle, MD Taking Active   pantoprazole (PROTONIX) 40 MG tablet [id number] Yes Take 1 tablet (40 mg total) by mouth daily. Please translate to Spanish. Susy Frizzle, MD Taking Active   rosuvastatin (CRESTOR) 10 MG tablet [id number] Yes Take 1 tablet (10 mg total) by mouth daily. Susy Frizzle, MD Taking Active   solifenacin (VESICARE) 10 MG tablet [id number] Yes TOME UNA TABLETA TODOS LOS DIAS Susy Frizzle, MD Taking Active   traMADol (ULTRAM) 50 MG tablet [id number] Yes Take 1 tablet (50 mg total) by mouth every 6 (six) hours as needed. Susy Frizzle, MD Taking Active             Patient Active Problem List   Diagnosis Date Noted   B12 deficiency 01/11/2020   Hypertension    Hyperlipidemia    Diabetes mellitus without complication ([place])    GERD (gastroesophageal reflux disease)     Immunization History  Administered Date(s) Administered   Influenza,inj,Quad PF,6+ Mos 05/23/2015   Pneumococcal Conjugate-13 04/01/2017   Pneumococcal Polysaccharide-23 06/25/2010    Conditions to be addressed/monitored:  HTN, GERD, DM, HLD  Care Plan : General Pharmacy (Adult)  Updates made by Edythe Clarity, RPH since 09/20/2021 12:00 AM     Problem: HTN, GERD, DM, HLD   Priority: High  Onset Date: 03/15/2021     Long-Range Goal: Patient-Specific Goal   Start  Date: 03/15/2021  Expected End Date: 09/15/2021  Recent Progress: On track  Priority: High  Note:   Current Barriers:  Unable to self administer medications as prescribed  Pharmacist Clinical Goal(s):  Patient will achieve adherence to monitoring guidelines and medication adherence to achieve therapeutic efficacy adhere to prescribed medication regimen as evidenced by pill packs contact provider office for questions/concerns as evidenced notation of same in electronic health record through collaboration with PharmD and provider.   Interventions: 1:1 collaboration with Susy Frizzle, MD regarding development and update of comprehensive plan of care as evidenced by provider attestation and co-signature Inter-disciplinary care team collaboration (see longitudinal plan of care) Comprehensive medication review performed; medication list updated in electronic medical record  Hypertension  (Status:Goal on track: YES.)   Med Management Intervention: None  (BP goal <140/90) -Controlled -Current treatment: Losartan 13m daily Query Appropriate, Metoprolol XL 2100mdaily Appropriate, Effective, Safe, Accessible -Medications previously tried: none noted  -Current home readings: checks periodically " normal per son" -Current dietary habits: eats about two meals per day, her diet is improving -Current exercise habits: minimal, walking hurts her hip -Denies hypotensive/hypertensive symptoms -Educated on BP goals and benefits of medications for prevention of heart attack, stroke and kidney damage; Importance of home blood pressure monitoring; Symptoms of hypotension and importance of maintaining adequate hydration; -Counseled to monitor BP at home a few times per week, document, and provide log at future appointments -Recommended to continue current medication  Update 09/20/21 Patient's son reports she is doing well, [place] - patient  appeared to have bump in Cr back in October.  Patient son states PCP told him to go back on it and pharmacy has been sending.  She has been on losartan.  I would recommend bringing her in for BMP to check kidney function to ensure it is safe for her continue.  Will coordinate with PCP. FU after labs on next steps.  Hyperlipidemia: (LDL goal < 70) -Controlled -Current treatment: Rosuvastatin 53m daily -Medications previously tried: none noted  -She was currently out of this medication due to a mix up at the pharmacy.  There has been some confusion on how many tablets she was actually given. -Educated on Cholesterol goals;  Benefits of statin for ASCVD risk reduction; Importance of limiting foods high in cholesterol; -Recommended to continue current medication Will help patient with adherence by setting them up with packaging.  Diabetes (A1c goal <6.5%) -Controlled -Current medications: None noted -Medications previously tried: none noted  -Current home glucose readings fasting glucose: not checking  post prandial glucose: not checking -Denies hypoglycemic/hyperglycemic symptoms -Educated on A1c and blood sugar goals; -Counseled to check feet daily and get yearly eye exams -Will consult with PCP if patient should be taking this med, unclear from chart history.  Update 09/20/21 Diabetes continues to be adequately controlled with no medications. Continue with current management, regular A1c screenings. Due for diabetic eye exam. No changes at this time.   GERD (Goal: Minimize symptoms) -Controlled -Current treatment  Pantoprazole 480mdaily -Medications previously tried: none noted -Patient will get symptoms if does not take this med.  Currently symptoms are controlled  -Recommended to continue current medication  Patient Goals/Self-Care Activities Patient will:  - focus on medication adherence by pill counts/packs check blood pressure periodically, document, and provide at  future appointments Collaborate with provider to get synchronized into adherence packaging.  Follow Up Plan: The care management team will reach out to the patient again over the next 180 days.              Medication Assistance: None required.  Patient affirms current coverage meets needs.  Compliance/Adherence/Medication fill history: Care Gaps: Foot exam Eye exam  Star-Rating Drugs: Rosuvastatin 10 mg 90 DS 01/10/21  Patient[place]  [facility] [place] [address] [place]: [phone]: [phone] Uses pill box? Yes - son organizes Pt endorses 100% compliance  We discussed: Benefits of medication synchronization, packaging and delivery as well as enhanced pharmacist oversight with [facility]. Patient decided to: Utilize [facility] for medication synchronization, packaging and delivery Verbal consent obtained for [facility] enhanced pharmacy services (medication synchronization, adherence packaging, delivery coordination). A medication sync plan was created to allow patient to get all medications delivered once every 30 to 90 days per patient preference. Patient understands they have freedom to choose pharmacy and clinical pharmacist will coordinate care between all prescribers and [facility].  Care Plan and Follow Up Patient Decision:  Patient agrees to Care Plan and Follow-up.  Plan: The care management team will reach out to the patient again over the next 180 days.  ChBeverly MilchPharmD Clinical Pharmacist [phone]

## 2021-09-20 ENCOUNTER — Ambulatory Visit (INDEPENDENT_AMBULATORY_CARE_PROVIDER_SITE_OTHER): Payer: Medicare Other | Admitting: Pharmacist

## 2021-09-20 DIAGNOSIS — E119 Type 2 diabetes mellitus without complications: Secondary | ICD-10-CM

## 2021-09-20 DIAGNOSIS — I10 Essential (primary) hypertension: Secondary | ICD-10-CM

## 2021-09-20 NOTE — Patient Instructions (Addendum)
Visit Information   Goals Addressed             This Visit's Progress    Manage My Medicine   On track    Timeframe:  Long-Range Goal Priority:  High Start Date:03/15/21                             Expected End Date:09/15/21                       Follow Up Date 07/11/21    Work to improve adherence with packaging and synchronization from [facility].   Why is this important?   These steps will help you keep on track with your medicines.   Notes:        Patient Care Plan: General Pharmacy (Adult)     Problem Identified: HTN, GERD, DM, HLD   Priority: High  Onset Date: 03/15/2021     Long-Range Goal: Patient-Specific Goal   Start Date: 03/15/2021  Expected End Date: 09/15/2021  Recent Progress: On track  Priority: High  Note:   Current Barriers:  Unable to self administer medications as prescribed  Pharmacist Clinical Goal(s):  Patient will achieve adherence to monitoring guidelines and medication adherence to achieve therapeutic efficacy adhere to prescribed medication regimen as evidenced by pill packs contact provider office for questions/concerns as evidenced notation of same in electronic health record through collaboration with PharmD and provider.   Interventions: 1:1 collaboration with Donita Brooks, MD regarding development and update of comprehensive plan of care as evidenced by provider attestation and co-signature Inter-disciplinary care team collaboration (see longitudinal plan of care) Comprehensive medication review performed; medication list updated in electronic medical record  Hypertension  (Status:Goal on track: YES.)   Med Management Intervention: None  (BP goal <140/90) -Controlled -Current treatment: Losartan 100mg  daily Query Appropriate, Metoprolol XL 25mg  daily Appropriate, Effective, Safe, Accessible -Medications previously tried: none noted  -Current home readings: checks periodically " normal per son" -Current dietary habits: eats  about two meals per day, her diet is improving -Current exercise habits: minimal, walking hurts her hip -Denies hypotensive/hypertensive symptoms -Educated on BP goals and benefits of medications for prevention of heart attack, stroke and kidney damage; Importance of home blood pressure monitoring; Symptoms of hypotension and importance of maintaining adequate hydration; -Counseled to monitor BP at home a few times per week, document, and provide log at future appointments -Recommended to continue current medication  Update 09/20/21 Patient's son reports she is doing well, just got back from vacation in . Did review most recent labs - patient appeared to have bump in Cr back in October.  Patient son states PCP told him to go back on it and pharmacy has been sending.  She has been on losartan.  I would recommend bringing her in for BMP to check kidney function to ensure it is safe for her continue.  Will coordinate with PCP. FU after labs on next steps.  Hyperlipidemia: (LDL goal < 70) -Controlled -Current treatment: Rosuvastatin 10mg  daily -Medications previously tried: none noted  -She was currently out of this medication due to a mix up at the pharmacy.  There has been some confusion on how many tablets she was actually given. -Educated on Cholesterol goals;  Benefits of statin for ASCVD risk reduction; Importance of limiting foods high in cholesterol; -Recommended to continue current medication Will help patient with adherence by setting them up with packaging.  Diabetes (A1c goal <6.5%) -Controlled -Current medications: None noted -Medications previously tried: none noted  -Current home glucose readings fasting glucose: not checking  post prandial glucose: not checking -Denies hypoglycemic/hyperglycemic symptoms -Educated on A1c and blood sugar goals; -Counseled to check feet daily and get yearly eye exams -Will consult with PCP if patient should be taking this med,  unclear from chart history.  Update 09/20/21 Diabetes continues to be adequately controlled with no medications. Continue with current management, regular A1c screenings. Due for diabetic eye exam. No changes at this time.   GERD (Goal: Minimize symptoms) -Controlled -Current treatment  Pantoprazole 40mg  daily -Medications previously tried: none noted -Patient will get symptoms if does not take this med.  Currently symptoms are controlled  -Recommended to continue current medication  Patient Goals/Self-Care Activities Patient will:  - focus on medication adherence by pill counts/packs check blood pressure periodically, document, and provide at future appointments Collaborate with provider to get synchronized into adherence packaging.  Follow Up Plan: The care management team will reach out to the patient again over the next 180 days.             Patient verbalizes understanding of instructions and care plan provided today and agrees to view in MyChart. Active MyChart status confirmed with patient.   Telephone follow up appointment with pharmacy team member scheduled for: 6 months  , [facility], PharmD, CPP Clinical Pharmacist Practitioner [facility] [phone]

## 2021-09-21 ENCOUNTER — Telehealth: Payer: Self-pay | Admitting: Pharmacist

## 2021-09-21 NOTE — Chronic Care Management (AMB) (Signed)
Spoke with patient son Shellee Milo.  He is going to call to make lab appt for the soonest that they are available.  Beverly Milch, PharmD, CPP Clinical Pharmacist Practitioner [place] [phone]

## 2021-09-21 NOTE — Telephone Encounter (Signed)
-----   Message from Susy Frizzle, MD sent at 09/21/2021  6:25 AM EST ----- yes ----- Message ----- From: Edythe Clarity, [facility] Sent: 09/20/2021   3:56 PM EST To: Susy Frizzle, MD  Just spoke with her son.  Looks like she has been taking the losartan since her Cr bump back in October.  He was under the impression she was to resume it.  Do you want her to come in for labs to check BMP to make sure it is safe for her to continue?  Beverly Milch, PharmD, CPP Clinical Pharmacist Practitioner [place] [phone]

## 2021-09-27 ENCOUNTER — Other Ambulatory Visit: Payer: Self-pay

## 2021-09-27 ENCOUNTER — Other Ambulatory Visit: Payer: Medicare Other

## 2021-09-27 DIAGNOSIS — I10 Essential (primary) hypertension: Secondary | ICD-10-CM

## 2021-09-27 DIAGNOSIS — E119 Type 2 diabetes mellitus without complications: Secondary | ICD-10-CM

## 2021-09-27 DIAGNOSIS — D500 Iron deficiency anemia secondary to blood loss (chronic): Secondary | ICD-10-CM | POA: Insufficient documentation

## 2021-09-27 DIAGNOSIS — K5900 Constipation, unspecified: Secondary | ICD-10-CM | POA: Insufficient documentation

## 2021-09-28 LAB — COMPREHENSIVE METABOLIC PANEL
AG Ratio: 1.3 (calc) (ref 1.0–2.5)
ALT: 10 U/L (ref 6–29)
AST: 14 U/L (ref 10–35)
Albumin: 3.6 g/dL (ref 3.6–5.1)
Alkaline phosphatase (APISO): 73 U/L (ref 37–153)
BUN/Creatinine Ratio: 16 (calc) (ref 6–22)
BUN: 20 mg/dL (ref 7–25)
CO2: 22 mmol/L (ref 20–32)
Calcium: 8.9 mg/dL (ref 8.6–10.4)
Chloride: 114 mmol/L — ABNORMAL HIGH (ref 98–110)
Creat: 1.26 mg/dL — ABNORMAL HIGH (ref 0.60–0.95)
Globulin: 2.8 g/dL (calc) (ref 1.9–3.7)
Glucose, Bld: 100 mg/dL — ABNORMAL HIGH (ref 65–99)
Potassium: 5.2 mmol/L (ref 3.5–5.3)
Sodium: 144 mmol/L (ref 135–146)
Total Bilirubin: 0.6 mg/dL (ref 0.2–1.2)
Total Protein: 6.4 g/dL (ref 6.1–8.1)

## 2021-09-28 LAB — HEMOGLOBIN A1C
Hgb A1c MFr Bld: 6.6 % of total Hgb — ABNORMAL HIGH (ref <5.7)
Mean Plasma Glucose: 143 mg/dL
eAG (mmol/L): 7.9 mmol/L

## 2021-10-09 DIAGNOSIS — I10 Essential (primary) hypertension: Secondary | ICD-10-CM | POA: Diagnosis not present

## 2021-10-09 DIAGNOSIS — E785 Hyperlipidemia, unspecified: Secondary | ICD-10-CM

## 2021-10-09 DIAGNOSIS — E119 Type 2 diabetes mellitus without complications: Secondary | ICD-10-CM

## 2021-10-10 ENCOUNTER — Telehealth: Payer: Self-pay | Admitting: Pharmacist

## 2021-10-10 NOTE — Progress Notes (Addendum)
? ? ?  Chronic Care Management ?Pharmacy Assistant  ? ?Name: Claudia [name]  MRN: [id number] DOB: 1941/02/17 ? ? ?Reason for Encounter: Medication Coordination Call ?  ? ?Recent office visits:  ?None since last CPP visit ? ?Recent consult visits:  ?None since last CPP visit ? ?Hospital visits:  ?None since last CPP visit ? ?Medications: ?Outpatient Encounter Medications as of 10/10/2021  ?Medication Sig  ? aspirin 81 MG tablet Take 1 tablet (81 mg total) by mouth daily.  ? [facility]S VITAMIN B12 1000 MCG tablet TOME UNA TABLETA TODOS LOS DIAS  ? diclofenac sodium (VOLTAREN) 1 % GEL Apply 2 g topically 4 (four) times daily.  ? glucose blood (BAYER CONTOUR TEST) test strip Check BS QAM fasting  ? loratadine (CLARITIN) 10 MG tablet Take 10 mg by mouth daily.  ? losartan (COZAAR) 100 MG tablet TOME UNA TABLETA TODOS LOS DIAS  ? metoprolol succinate (TOPROL-XL) 25 MG 24 hr tablet TOME UNA TABLETA POR VIA ORAL TODOS LOS DIAS  ? pantoprazole (PROTONIX) 40 MG tablet Take 1 tablet (40 mg total) by mouth daily. Please translate to Spanish.  ? rosuvastatin (CRESTOR) 10 MG tablet Take 1 tablet (10 mg total) by mouth daily.  ? solifenacin (VESICARE) 10 MG tablet TOME UNA TABLETA TODOS LOS DIAS  ? traMADol (ULTRAM) 50 MG tablet Take 1 tablet (50 mg total) by mouth every 6 (six) hours as needed.  ? ?No facility-administered encounter medications on file as of 10/10/2021.  ? ?Reviewed chart for medication changes ahead of medication coordination call. ? ?No OVs, Consults, or hospital visits since last care coordination call/Pharmacist visit. ? ?No medication changes indicated. ? ?BP Readings from Last 3 Encounters:  ?04/19/21 94/60  ?09/11/20 104/60  ?01/11/20 100/72  ?  ?Lab Results  ?Component Value Date  ? HGBA1C 6.6 (H) 09/27/2021  ?  ? ?Patient obtains medications through Adherence Packaging  30 Days  ? ?Last adherence delivery included:  ?Vitamin B12 100 mcg 1 tablet at breakfast  ?Metoprolol ER 25 mg 1 tablet at breakfast  ?Pantoprazole  40 mg 1 tablet at breakfast  ?Rosuvastatin 10 mg 1 tablet at breakfast  ?Losartan 100 mg 1 tablet at breakfast  ? ?Patient is due for next adherence delivery on: 10/22/2021. ?Called patient and reviewed medications and coordinated delivery. ? ?This delivery to include: ?Vitamin B12 100 mcg 1 tablet at breakfast  ?Metoprolol ER 25 mg 1 tablet at breakfast  ?Pantoprazole 40 mg 1 tablet at breakfast  ?Rosuvastatin 10 mg 1 tablet at breakfast  ?Losartan 100 mg 1 tablet at breakfast  ? ?Confirmed delivery date of 10/22/2021, advised patient that pharmacy will contact them the morning of delivery. ? ? ?Care Gaps: ?Medicare Annual Wellness: Overdue- declined ?Ophthalmology Exam: Overdue since 02/09/2021 ?Hemoglobin A1C: 6.6% on 09/27/2021 ?Colonoscopy: Aged out ?Dexa Scan: Overdue - never done ?Mammogram: Aged out ? ?Future Appointments  ?Date Time Provider Department Center  ?04/04/2022  3:45 PM [facility] PHARMACIST [facility] None  ? ? ?April D Calhoun, CMA ?Clinical Pharmacist Assistant ?[phone]  ? ?10 minutes spent in review, coordination, and documentation. ? ?Reviewed by: ?Willa Frater, PharmD ?Clinical Pharmacist ?[phone] ? ?

## 2021-11-08 ENCOUNTER — Telehealth: Payer: Self-pay | Admitting: Pharmacist

## 2021-11-08 ENCOUNTER — Other Ambulatory Visit: Payer: Self-pay

## 2021-11-08 MED ORDER — PANTOPRAZOLE SODIUM 40 MG PO TBEC: 40.0000 mg | DELAYED_RELEASE_TABLET | Freq: Every day | ORAL | 1 refills | Status: DC

## 2021-11-08 NOTE — Progress Notes (Signed)
? ? ?  Chronic Care Management ?Pharmacy Assistant  ? ?Name: Claudia [name]  MRN: [id number] DOB: 03-13-1941 ? ? ?Reason for Encounter: Medication Coordination Call ?  ? ?Recent office visits:  ?None ? ?Recent consult visits:  ?None ? ?Hospital visits:  ?None in previous 6 months ? ?Medications: ?Outpatient Encounter Medications as of 11/08/2021  ?Medication Sig  ? aspirin 81 MG tablet Take 1 tablet (81 mg total) by mouth daily.  ? CVS VITAMIN B12 1000 MCG tablet TOME UNA TABLETA TODOS LOS DIAS  ? diclofenac sodium (VOLTAREN) 1 % GEL Apply 2 g topically 4 (four) times daily.  ? glucose blood (BAYER CONTOUR TEST) test strip Check BS QAM fasting  ? loratadine (CLARITIN) 10 MG tablet Take 10 mg by mouth daily.  ? losartan (COZAAR) 100 MG tablet TOME UNA TABLETA TODOS LOS DIAS  ? metoprolol succinate (TOPROL-XL) 25 MG 24 hr tablet TOME UNA TABLETA POR VIA ORAL TODOS LOS DIAS  ? pantoprazole (PROTONIX) 40 MG tablet Take 1 tablet (40 mg total) by mouth daily. Please translate to Spanish.  ? rosuvastatin (CRESTOR) 10 MG tablet Take 1 tablet (10 mg total) by mouth daily.  ? solifenacin (VESICARE) 10 MG tablet TOME UNA TABLETA TODOS LOS DIAS  ? traMADol (ULTRAM) 50 MG tablet Take 1 tablet (50 mg total) by mouth every 6 (six) hours as needed.  ? ?No facility-administered encounter medications on file as of 11/08/2021.  ? ?Reviewed chart for medication changes ahead of medication coordination call. ? ?No OVs, Consults, or hospital visits since last care coordination call/Pharmacist visit. ? ?No medication changes indicated. ? ?BP Readings from Last 3 Encounters:  ?04/19/21 94/60  ?09/11/20 104/60  ?01/11/20 100/72  ?  ?Lab Results  ?Component Value Date  ? HGBA1C 6.6 (H) 09/27/2021  ?  ? ?Patient obtains medications through Adherence Packaging  30 Days  ? ?Last adherence delivery included:  ?Vitamin B12 100 mcg 1 tablet at breakfast  ?Metoprolol ER 25 mg 1 tablet at breakfast  ?Pantoprazole 40 mg 1 tablet at breakfast   ?Rosuvastatin 10 mg 1 tablet at breakfast  ?Losartan 100 mg 1 tablet at breakfast ? ?Patient is due for next adherence delivery on:11/20/2021 ?Called patient and reviewed medications and coordinated delivery. ? ?This delivery to include: ?Vitamin B12 100 mcg 1 tablet at breakfast  ?Metoprolol ER 25 mg 1 tablet at breakfast  ?Pantoprazole 40 mg 1 tablet at breakfast  ?Rosuvastatin 10 mg 1 tablet at breakfast  ?Losartan 100 mg 1 tablet at breakfast ?Solifenacin 10 mg 1 tablet at breakfast ? ?Patient needs refills for: ?Pantoprazole 40 mg - Rx refill request sent to [facility] ? ?Confirmed delivery date of 11/20/2021, advised patient that pharmacy will contact them the morning of delivery. ? ? ?Care Gaps: ?Medicare Annual Wellness: Overdue ?Ophthalmology Exam: Overdue since 02/09/2021 ?Hemoglobin A1C: 6.6% on 09/27/2021 ?Colonoscopy: Aged out ?Dexa Scan: Overdue - never done ?Mammogram: Aged out ? ?Future Appointments  ?Date Time Provider Department Center  ?04/04/2022  3:45 PM [facility] PHARMACIST [facility]  ? ?April D Calhoun, CMA ?Clinical Pharmacist Assistant ?[phone] ? ?

## 2021-11-11 ENCOUNTER — Other Ambulatory Visit: Payer: Self-pay | Admitting: Family Medicine

## 2021-12-11 ENCOUNTER — Telehealth: Payer: Self-pay | Admitting: Pharmacist

## 2021-12-11 NOTE — Progress Notes (Signed)
? ? ?  Chronic Care Management ?Pharmacy Assistant  ? ?Name: Claudia [name]  MRN: [id number] DOB: [birth year] ? ? ?Reason for Encounter: Medication Coordination Call ?  ? ?Recent office visits:  ?None ? ?Recent consult visits:  ?None ? ?Hospital visits:  ?None in previous 6 months ? ?Medications: ?Outpatient Encounter Medications as of 12/11/2021  ?Medication Sig  ? aspirin 81 MG tablet Take 1 tablet (81 mg total) by mouth daily.  ? CVS VITAMIN B12 1000 MCG tablet TOME UNA TABLETA TODOS LOS DIAS  ? diclofenac sodium (VOLTAREN) 1 % GEL Apply 2 g topically 4 (four) times daily.  ? glucose blood (BAYER CONTOUR TEST) test strip Check BS QAM fasting  ? loratadine (CLARITIN) 10 MG tablet Take 10 mg by mouth daily.  ? losartan (COZAAR) 100 MG tablet TOME UNA TABLETA TODOS LOS DIAS  ? metoprolol succinate (TOPROL-XL) 25 MG 24 hr tablet TOME UNA TABLETA POR VIA ORAL TODOS LOS DIAS  ? pantoprazole (PROTONIX) 40 MG tablet Take 1 tablet (40 mg total) by mouth daily. Please translate to Spanish.  ? rosuvastatin (CRESTOR) 10 MG tablet Take 1 tablet (10 mg total) by mouth daily.  ? solifenacin (VESICARE) 10 MG tablet TOME UNA TABLETA TODOS LOS DIAS  ? traMADol (ULTRAM) 50 MG tablet Take 1 tablet (50 mg total) by mouth every 6 (six) hours as needed.  ? ?No facility-administered encounter medications on file as of 12/11/2021.  ? ?Reviewed chart for medication changes ahead of medication coordination call. ? ?No OVs, Consults, or hospital visits since last care coordination call/Pharmacist visit. ? ?No medication changes indicated. ? ?BP Readings from Last 3 Encounters:  ?04/19/21 94/60  ?09/11/20 104/60  ?01/11/20 100/72  ?  ?Lab Results  ?Component Value Date  ? HGBA1C 6.6 (H) 09/27/2021  ?  ? ?Patient obtains medications through Adherence Packaging  30 Days  ? ?Last adherence delivery included:  ?Vitamin B12 100 mcg 1 tablet at breakfast  ?Metoprolol ER 25 mg 1 tablet at breakfast  ?Pantoprazole 40 mg 1 tablet at breakfast  ?Rosuvastatin  10 mg 1 tablet at breakfast  ?Losartan 100 mg 1 tablet at breakfast ?Solifenacin 10 mg 1 tablet at breakfast ? ?Patient is due for next adherence delivery on: 12/20/2021. ?Called patient and reviewed medications and coordinated delivery. ? ?This delivery to include: ?Vitamin B12 100 mcg 1 tablet at breakfast  ?Metoprolol ER 25 mg 1 tablet at breakfast  ?Pantoprazole 40 mg 1 tablet at breakfast  ?Rosuvastatin 10 mg 1 tablet at breakfast  ?Losartan 100 mg 1 tablet at breakfast ?Solifenacin 10 mg 1 tablet at breakfast ? ?Confirmed delivery date of 12/20/2021, advised patient that pharmacy will contact them the morning of delivery. ? ? ?Care Gaps: ?Medicare Annual Wellness: Overdue ?Ophthalmology Exam: Overdue since 02/09/2021 ?Hemoglobin A1C: 6.6% on 09/27/2021 ?Colonoscopy: Aged out ?Dexa Scan: Overdue - never done ?Mammogram: Aged out ? ?Future Appointments  ?Date Time Provider [place]  ?04/04/2022  3:45 PM [facility] PHARMACIST [facility]  ? ?April D Calhoun, [place] ?Clinical Pharmacist Assistant ?[phone] ?

## 2022-01-08 ENCOUNTER — Telehealth: Payer: Self-pay | Admitting: Pharmacist

## 2022-01-08 NOTE — Progress Notes (Signed)
    Chronic Care Management Pharmacy Assistant   Name: Corabel Lubow  MRN: [id number] DOB: [birth year]  Reason for Encounter: Medication Coordination Call    Recent office visits:  None  Recent consult visits:  None  Hospital visits:  None in previous 6 months  Medications: Outpatient Encounter Medications as of 01/08/2022  Medication Sig   aspirin 81 MG tablet Take 1 tablet (81 mg total) by mouth daily.   CVS VITAMIN B12 1000 MCG tablet TOME UNA TABLETA TODOS LOS DIAS   diclofenac sodium (VOLTAREN) 1 % GEL Apply 2 g topically 4 (four) times daily.   glucose blood (BAYER CONTOUR TEST) test strip Check BS QAM fasting   loratadine (CLARITIN) 10 MG tablet Take 10 mg by mouth daily.   losartan (COZAAR) 100 MG tablet TOME UNA TABLETA TODOS LOS DIAS   metoprolol succinate (TOPROL-XL) 25 MG 24 hr tablet TOME UNA TABLETA POR VIA ORAL TODOS LOS DIAS   pantoprazole (PROTONIX) 40 MG tablet Take 1 tablet (40 mg total) by mouth daily. Please translate to Spanish.   rosuvastatin (CRESTOR) 10 MG tablet Take 1 tablet (10 mg total) by mouth daily.   solifenacin (VESICARE) 10 MG tablet TOME UNA TABLETA TODOS LOS DIAS   traMADol (ULTRAM) 50 MG tablet Take 1 tablet (50 mg total) by mouth every 6 (six) hours as needed.   No facility-administered encounter medications on file as of 01/08/2022.   Reviewed chart for medication changes ahead of medication coordination call.  No OVs, Consults, or hospital visits since last care coordination call/Pharmacist visit.  No medication changes indicated.  BP Readings from Last 3 Encounters:  04/19/21 94/60  09/11/20 104/60  01/11/20 100/72    Lab Results  Component Value Date   HGBA1C 6.6 (H) 09/27/2021     Patient obtains medications through Adherence Packaging  30 Days   Last adherence delivery included:  Vitamin B12 100 mcg 1 tablet at breakfast  Metoprolol ER 25 mg 1 tablet at breakfast  Pantoprazole 40 mg 1 tablet at breakfast  Rosuvastatin  10 mg 1 tablet at breakfast  Losartan 100 mg 1 tablet at breakfast Solifenacin 10 mg 1 tablet at breakfast  Patient is due for next adherence delivery on: 01/18/2022. Called patient and reviewed medications and coordinated delivery.  This delivery to include: Vitamin B12 100 mcg 1 tablet at breakfast  Metoprolol ER 25 mg 1 tablet at breakfast  Pantoprazole 40 mg 1 tablet at breakfast  Rosuvastatin 10 mg 1 tablet at breakfast  Losartan 100 mg 1 tablet at breakfast Solifenacin 10 mg 1 tablet at breakfast  Confirmed delivery date of 01/18/2022, advised patient that pharmacy will contact them the morning of delivery.  Care Gaps: Medicare Annual Wellness: Overdue Ophthalmology Exam: Overdue since 02/09/2021 Hemoglobin A1C: 6.6% on 09/27/2021 Colonoscopy: Aged out Dexa Scan: Overdue - never done Mammogram: Aged out  Future Appointments  Date Time Provider [place]  04/04/2022  3:45 PM [facility] PHARMACIST [facility]   April D Calhoun, [place] Pharmacist Assistant [phone]

## 2022-02-08 ENCOUNTER — Telehealth: Payer: Self-pay | Admitting: Pharmacist

## 2022-02-08 ENCOUNTER — Other Ambulatory Visit: Payer: Self-pay

## 2022-02-08 MED ORDER — LOSARTAN POTASSIUM 100 MG PO TABS: ORAL_TABLET | ORAL | 0 refills | Status: DC

## 2022-02-08 NOTE — Progress Notes (Signed)
    Chronic Care Management Pharmacy Assistant   Name: Claudia [name]  MRN: [id number] DOB: 03-16-1941   Reason for Encounter: Medication Coordination Call   Recent office visits:  None  Recent consult visits:  None  Hospital visits:  None in previous 6 months  Medications: Outpatient Encounter Medications as of 02/08/2022  Medication Sig   aspirin 81 MG tablet Take 1 tablet (81 mg total) by mouth daily.   CVS VITAMIN B12 1000 MCG tablet TOME UNA TABLETA TODOS LOS DIAS   diclofenac sodium (VOLTAREN) 1 % GEL Apply 2 g topically 4 (four) times daily.   glucose blood (BAYER CONTOUR TEST) test strip Check BS QAM fasting   loratadine (CLARITIN) 10 MG tablet Take 10 mg by mouth daily.   losartan (COZAAR) 100 MG tablet TOME UNA TABLETA TODOS LOS DIAS   metoprolol succinate (TOPROL-XL) 25 MG 24 hr tablet TOME UNA TABLETA POR VIA ORAL TODOS LOS DIAS   pantoprazole (PROTONIX) 40 MG tablet Take 1 tablet (40 mg total) by mouth daily. Please translate to Spanish.   rosuvastatin (CRESTOR) 10 MG tablet Take 1 tablet (10 mg total) by mouth daily.   solifenacin (VESICARE) 10 MG tablet TOME UNA TABLETA TODOS LOS DIAS   traMADol (ULTRAM) 50 MG tablet Take 1 tablet (50 mg total) by mouth every 6 (six) hours as needed.   No facility-administered encounter medications on file as of 02/08/2022.   Reviewed chart for medication changes ahead of medication coordination call.  No OVs, Consults, or hospital visits since last care coordination call/Pharmacist visit.  No medication changes indicated.  BP Readings from Last 3 Encounters:  04/19/21 94/60  09/11/20 104/60  01/11/20 100/72    Lab Results  Component Value Date   HGBA1C 6.6 (H) 09/27/2021     Patient obtains medications through Adherence Packaging  30 Days   Last adherence delivery included:  Vitamin B12 100 mcg 1 tablet at breakfast  Metoprolol ER 25 mg 1 tablet at breakfast  Pantoprazole 40 mg 1 tablet at breakfast  Rosuvastatin  10 mg 1 tablet at breakfast  Losartan 100 mg 1 tablet at breakfast Solifenacin 10 mg 1 tablet at breakfast  Patient is due for next adherence delivery on: 02/19/2022. Called patient and reviewed medications and coordinated delivery.  This delivery to include: Vitamin B12 100 mcg 1 tablet at breakfast  Metoprolol ER 25 mg 1 tablet at breakfast  Pantoprazole 40 mg 1 tablet at breakfast  Rosuvastatin 10 mg 1 tablet at breakfast  Losartan 100 mg 1 tablet at breakfast Solifenacin 10 mg 1 tablet at breakfast  Patient needs refills for Losartan.  Confirmed delivery date of 02/19/2022, advised patient that pharmacy will contact them the morning of delivery.  Care Gaps: Medicare Annual Wellness: Overdue Ophthalmology Exam: Overdue since 02/09/2021 Hemoglobin A1C: 6.6% on 09/27/2021 Colonoscopy: Aged out Dexa Scan: Overdue - never done Mammogram: Aged out  Future Appointments  Date Time Provider Department Center  04/04/2022  3:45 PM [facility] PHARMACIST [facility]   April D Calhoun, [facility] Clinical Pharmacist Assistant [phone]

## 2022-02-08 NOTE — Telephone Encounter (Signed)
-----   Message from April D Calhoun sent at 02/08/2022 11:39 AM EDT ----- Regarding: Rx refill request Patient has an upcoming delivery with [facility]. She will need a 30 DS of Losartan to complete her order. Please send refills to [facility].  Thank you,  April D Calhoun, [facility] Clinical Pharmacist Assistant [phone]

## 2022-02-08 NOTE — Telephone Encounter (Signed)
Requested Prescriptions  Pending Prescriptions Disp Refills  . losartan (COZAAR) 100 MG tablet 30 tablet 0    Sig: TOME UNA TABLETA TODOS LOS DIAS     Cardiovascular:  Angiotensin Receptor Blockers Failed - 02/08/2022 11:53 AM      Failed - Cr in normal range and within 180 days    Creat  Date Value Ref Range Status  09/27/2021 1.26 (H) 0.60 - 0.95 mg/dL Final         Failed - Valid encounter within last 6 months    Recent Outpatient Visits          9 months ago Essential hypertension   [facility] Medicine Donita Brooks, MD   1 year ago Controlled type 2 diabetes mellitus without complication, without long-term current use of insulin (HCC)   [facility] Medicine Pickard, Priscille Heidelberg, MD   2 years ago B12 deficiency   [facility] Medicine Elmore Guise, FNP   2 years ago Epigastric pain   [facility] Tanya Nones, Priscille Heidelberg, MD   2 years ago Essential hypertension   [facility] Medicine Tanya Nones, Priscille Heidelberg, MD             Passed - K in normal range and within 180 days    Potassium  Date Value Ref Range Status  09/27/2021 5.2 3.5 - 5.3 mmol/L Final         Passed - Patient is not pregnant      Passed - Last BP in normal range    BP Readings from Last 1 Encounters:  04/19/21 94/60

## 2022-03-08 ENCOUNTER — Telehealth: Payer: Self-pay | Admitting: Pharmacist

## 2022-03-08 ENCOUNTER — Other Ambulatory Visit: Payer: Self-pay | Admitting: *Deleted

## 2022-03-08 DIAGNOSIS — R0789 Other chest pain: Secondary | ICD-10-CM

## 2022-03-08 NOTE — Telephone Encounter (Signed)
Patient called, left VM to return the call to the office to scheduled an appt for medication refill request.   

## 2022-03-08 NOTE — Telephone Encounter (Signed)
Requested medication (s) are due for refill today: yes  Requested medication (s) are on the active medication list: yes  Last refill:  losartan 02/08/22 #30/0, metoprolol 03/23/21 #90/3  Future visit scheduled: no  Notes to clinic:  Unable to refill per protocol, appointment needed. Pt called, [facility]MT[facility]     Requested Prescriptions  Pending Prescriptions Disp Refills   metoprolol succinate (TOPROL-XL) 25 MG 24 hr tablet 90 tablet 3    Sig: TOME UNA TABLETA POR VIA ORAL TODOS LOS DIAS     Cardiovascular:  Beta Blockers Failed - 03/08/2022  3:28 PM      Failed - Valid encounter within last 6 months    Recent Outpatient Visits           10 months ago Essential hypertension   [facility] Medicine Donita Brooks, MD   1 year ago Controlled type 2 diabetes mellitus without complication, without long-term current use of insulin (HCC)   [facility] Medicine Pickard, Priscille Heidelberg, MD   2 years ago B12 deficiency   [facility] Medicine Elmore Guise, FNP   2 years ago Epigastric pain   [facility] Pickard, Priscille Heidelberg, MD   2 years ago Essential hypertension   [facility] Pickard, Priscille Heidelberg, MD              Passed - Last BP in normal range    BP Readings from Last 1 Encounters:  04/19/21 94/60         Passed - Last Heart Rate in normal range    Pulse Readings from Last 1 Encounters:  04/19/21 68          losartan (COZAAR) 100 MG tablet 30 tablet 0    Sig: TOME UNA TABLETA TODOS LOS DIAS     Cardiovascular:  Angiotensin Receptor Blockers Failed - 03/08/2022  3:28 PM      Failed - Cr in normal range and within 180 days    Creat  Date Value Ref Range Status  09/27/2021 1.26 (H) 0.60 - 0.95 mg/dL Final         Failed - Valid encounter within last 6 months    Recent Outpatient Visits           10 months ago Essential hypertension   [facility] Medicine Donita Brooks, MD   1 year ago Controlled type 2  diabetes mellitus without complication, without long-term current use of insulin (HCC)   [facility] Medicine Pickard, Priscille Heidelberg, MD   2 years ago B12 deficiency   [facility] Medicine Elmore Guise, FNP   2 years ago Epigastric pain   [facility] Tanya Nones, Priscille Heidelberg, MD   2 years ago Essential hypertension   [facility] Medicine Tanya Nones, Priscille Heidelberg, MD              Passed - K in normal range and within 180 days    Potassium  Date Value Ref Range Status  09/27/2021 5.2 3.5 - 5.3 mmol/L Final         Passed - Patient is not pregnant      Passed - Last BP in normal range    BP Readings from Last 1 Encounters:  04/19/21 94/60

## 2022-03-08 NOTE — Telephone Encounter (Signed)
-----   Message from April D Calhoun sent at 03/08/2022  3:04 PM EDT ----- Regarding: Refill request Patient has an upcoming delivery with [facility]. She will need a 30 DS of the following medications to complete her order:  MetoprololER25 mg 1 tablet at breakfast  Losartan 100 mg 1 tablet at breakfast  Please send refills to [facility].  Thank you,  April D Calhoun, [facility] Clinical Pharmacist Assistant [phone]

## 2022-03-08 NOTE — Progress Notes (Signed)
    Chronic Care Management Pharmacy Assistant   Name: Claudia [name]  MRN: [id number] DOB: Jun 23, 1941   Reason for Encounter: Medication Coordination Call    Recent office visits:  None  Recent consult visits:  None  Hospital visits:  None in previous 6 months  Medications: Outpatient Encounter Medications as of 03/08/2022  Medication Sig   aspirin 81 MG tablet Take 1 tablet (81 mg total) by mouth daily.   CVS VITAMIN B12 1000 MCG tablet TOME UNA TABLETA TODOS LOS DIAS   diclofenac sodium (VOLTAREN) 1 % GEL Apply 2 g topically 4 (four) times daily.   glucose blood (BAYER CONTOUR TEST) test strip Check BS QAM fasting   loratadine (CLARITIN) 10 MG tablet Take 10 mg by mouth daily.   losartan (COZAAR) 100 MG tablet TOME UNA TABLETA TODOS LOS DIAS   metoprolol succinate (TOPROL-XL) 25 MG 24 hr tablet TOME UNA TABLETA POR VIA ORAL TODOS LOS DIAS   pantoprazole (PROTONIX) 40 MG tablet Take 1 tablet (40 mg total) by mouth daily. Please translate to Spanish.   rosuvastatin (CRESTOR) 10 MG tablet Take 1 tablet (10 mg total) by mouth daily.   solifenacin (VESICARE) 10 MG tablet TOME UNA TABLETA TODOS LOS DIAS   traMADol (ULTRAM) 50 MG tablet Take 1 tablet (50 mg total) by mouth every 6 (six) hours as needed.   No facility-administered encounter medications on file as of 03/08/2022.   Reviewed chart for medication changes ahead of medication coordination call.  No OVs, Consults, or hospital visits since last care coordination call/Pharmacist visit.  No medication changes indicated.  BP Readings from Last 3 Encounters:  04/19/21 94/60  09/11/20 104/60  01/11/20 100/72    Lab Results  Component Value Date   HGBA1C 6.6 (H) 09/27/2021     Patient obtains medications through Adherence Packaging  30 Days   Last adherence delivery included:  Vitamin B12 100 mcg 1 tablet at breakfast  Metoprolol ER 25 mg 1 tablet at breakfast  Pantoprazole 40 mg 1 tablet at breakfast   Rosuvastatin 10 mg 1 tablet at breakfast  Losartan 100 mg 1 tablet at breakfast Solifenacin 10 mg 1 tablet at breakfast  Patient is due for next adherence delivery on: 03/20/2022. Called patient and reviewed medications and coordinated delivery.  This delivery to include: Vitamin B12 100 mcg 1 tablet at breakfast  Metoprolol ER 25 mg 1 tablet at breakfast  Pantoprazole 40 mg 1 tablet at breakfast  Rosuvastatin 10 mg 1 tablet at breakfast  Losartan 100 mg 1 tablet at breakfast Solifenacin 10 mg 1 tablet at breakfast  Patient needs refills for: Metoprolol ER 25 mg 1 tablet at breakfast  Losartan 100 mg 1 tablet at breakfast  Confirmed delivery date of 03/20/2022, advised patient that pharmacy will contact them the morning of delivery.  Care Gaps: Medicare Annual Wellness: Overdue Ophthalmology Exam: Overdue since 02/09/2021 Hemoglobin A1C: 6.6% on 09/27/2021 Colonoscopy: Aged out Dexa Scan: Overdue - never done Mammogram: Aged out  Future Appointments  Date Time Provider Department Center  04/04/2022  3:45 PM [facility] PHARMACIST [facility]   April D Calhoun, [address] [facility] Clinical Pharmacist Assistant [phone]

## 2022-03-11 ENCOUNTER — Telehealth: Payer: Self-pay

## 2022-03-11 MED ORDER — METOPROLOL SUCCINATE ER 25 MG PO TB24: ORAL_TABLET | ORAL | 0 refills | Status: DC

## 2022-03-11 MED ORDER — LOSARTAN POTASSIUM 100 MG PO TABS: ORAL_TABLET | ORAL | 0 refills | Status: DC

## 2022-03-11 NOTE — Telephone Encounter (Signed)
Pls call pt to make annual for med future refills

## 2022-03-12 NOTE — Telephone Encounter (Signed)
Spoke with son and made the appt for 03/25/2022

## 2022-03-21 NOTE — Progress Notes (Deleted)
Chronic Care Management Pharmacy Note  03/21/2022 Name:  Claudia [name] MRN:  [id number] DOB:  Mar 25, 1941  Summary: Meds reviewed.  Patient continued to take losartan. Recommend BMP to check Cr.  Subjective: Claudia [name] is an 81 y.o. year old female who is a primary patient of Pickard, Cammie Mcgee, MD.  The CCM team was consulted for assistance with disease management and care coordination needs.    Engaged with patient by telephone for follow up visit in response to provider referral for pharmacy case management and/or care coordination services.   Consent to Services:  The patient was given the following information about Chronic Care Management services today, agreed to services, and gave verbal consent: 1. CCM service includes personalized support from designated clinical staff supervised by the primary care provider, including individualized plan of care and coordination with other care providers 2. 24/7 contact phone numbers for assistance for urgent and routine care needs. 3. Service will only be billed when office clinical staff spend 20 minutes or more in a month to coordinate care. 4. Only one practitioner may furnish and bill the service in a calendar month. 5.The patient may stop CCM services at any time (effective at the end of the month) by phone call to the office staff. 6. The patient will be responsible for cost sharing (co-pay) of up to 20% of the service fee (after annual deductible is met). Patient agreed to services and consent obtained.  Patient Care Team: Susy Frizzle, MD as PCP - General (Family Medicine) Edythe Clarity, [facility] as Pharmacist (Pharmacist)    Recent office visits:  None   Recent consult visits:  None   Hospital visits:  None in previous 6 months       Objective:  Lab Results  Component Value Date   CREATININE 1.26 (H) 09/27/2021   BUN 20 09/27/2021   GFRNONAA 47 (L) 09/11/2020   GFRAA 54 (L) 09/11/2020   NA 144 09/27/2021   K  5.2 09/27/2021   CALCIUM 8.9 09/27/2021   CO2 22 09/27/2021   GLUCOSE 100 (H) 09/27/2021    Lab Results  Component Value Date/Time   HGBA1C 6.6 (H) 09/27/2021 11:11 AM   HGBA1C 6.0 (H) 04/19/2021 11:01 AM   MICROALBUR 23.6 03/18/2019 08:50 AM   MICROALBUR 6.0 05/07/2018 11:38 AM    Last diabetic Eye exam: No results found for: "HMDIABEYEEXA"  Last diabetic Foot exam: No results found for: "HMDIABFOOTEX"   Lab Results  Component Value Date   CHOL 141 04/19/2021   HDL 47 (L) 04/19/2021   LDLCALC 74 04/19/2021   TRIG 113 04/19/2021   CHOLHDL 3.0 04/19/2021       Latest Ref Rng & Units 09/27/2021   11:11 AM 04/19/2021   11:01 AM 09/11/2020   11:36 AM  Hepatic Function  Total Protein 6.1 - 8.1 g/dL 6.4  6.7  6.9   AST 10 - 35 U/L '14  12  16   ' ALT 6 - 29 U/L '10  6  9   ' Total Bilirubin 0.2 - 1.2 mg/dL 0.6  0.5  0.5     No results found for: "TSH", "FREET4"     Latest Ref Rng & Units 04/19/2021   11:01 AM 09/11/2020   11:36 AM 01/11/2020   10:45 AM  CBC  WBC 3.8 - 10.8 Thousand/uL 7.4  8.8  8.6   Hemoglobin 11.7 - 15.5 g/dL 12.1  13.1  13.5   Hematocrit 35.0 - 45.0 % 39.0  40.3  43.9   Platelets 140 - 400 Thousand/uL 235  266  299     No results found for: "VD25OH"  Clinical ASCVD: No  The ASCVD Risk score (Arnett DK, et al., 2019) failed to calculate for the following reasons:   The 2019 ASCVD risk score is only valid for ages 42 to 51       01/11/2020    9:25 AM 05/07/2018   11:17 AM 04/01/2017    9:05 AM  Depression screen PHQ 2/9  Decreased Interest 0 0 0  Down, Depressed, Hopeless 0 0 0  PHQ - 2 Score 0 0 0  Altered sleeping 0    Tired, decreased energy 0    Change in appetite 0    Feeling bad or failure about yourself  0    Trouble concentrating 0    Moving slowly or fidgety/restless 0    Suicidal thoughts 0    PHQ-9 Score 0    Difficult doing work/chores Not difficult at all        Social History   Tobacco Use  Smoking Status Former   Types:  Cigarettes   Quit date: 1981   Years since quitting: 42.6  Smokeless Tobacco Never   BP Readings from Last 3 Encounters:  04/19/21 94/60  09/11/20 104/60  01/11/20 100/72   Pulse Readings from Last 3 Encounters:  04/19/21 68  09/11/20 65  01/11/20 69   Wt Readings from Last 3 Encounters:  04/19/21 140 lb 9.6 oz (63.8 kg)  09/11/20 131 lb (59.4 kg)  01/11/20 165 lb 9.6 oz (75.1 kg)   BMI Readings from Last 3 Encounters:  04/19/21 24.13 kg/m  09/11/20 22.49 kg/m  01/11/20 28.43 kg/m    Assessment/Interventions: Review of patient past medical history, allergies, medications, health status, including review of consultants reports, laboratory and other test data, was performed as part of comprehensive evaluation and provision of chronic care management services.   SDOH:  (Social Determinants of Health) assessments and interventions performed: Yes  Financial Resource Strain: Low Risk  (03/15/2021)   Overall Financial Resource Strain (CARDIA)    Difficulty of Paying Living Expenses: Not very hard    SDOH Screenings   Alcohol Screen: Not on file  Depression (PHQ2-9): Low Risk  (01/11/2020)   Depression (PHQ2-9)    PHQ-2 Score: 0  Financial Resource Strain: Low Risk  (03/15/2021)   Overall Financial Resource Strain (CARDIA)    Difficulty of Paying Living Expenses: Not very hard  Food Insecurity: Not on file  Housing: Not on file  Physical Activity: Not on file  Social Connections: Not on file  Stress: Not on file  Tobacco Use: Medium Risk (01/11/2020)   Patient History    Smoking Tobacco Use: Former    Smokeless Tobacco Use: Never    Passive Exposure: Not on file  Transportation Needs: Not on file    [place]  No Known Allergies  Medications Reviewed Today     Reviewed by Edythe Clarity, [facility] (Pharmacist) on 09/20/21 at [place] List Status: <None>   Medication Order Taking? Sig Documenting Provider Last Dose Status Informant  aspirin 81 MG tablet  [id number] Yes Take 1 tablet (81 mg total) by mouth daily. Orlena Sheldon, PA-C Taking Active   [facility] VITAMIN B12 1000 MCG tablet [id number] Yes TOME UNA TABLETA TODOS LOS DIAS Susy Frizzle, MD Taking Active   diclofenac sodium (VOLTAREN) 1 % GEL [id number] Yes Apply 2 g topically 4 (four) times daily.  Susy Frizzle, MD Taking Active   fluconazole (DIFLUCAN) 200 MG tablet [id number] Yes Take 1 tablet (200 mg total) by mouth daily. Take 480m (2 pills) for 1 dose, then 2061m(1 pill) for 13 doses Armbruster, StCarlota RaspberryMD Taking Active   glucose blood (BAYER CONTOUR TEST) test strip [id number] Check BS QAM fasting PiSusy FrizzleMD Taking Active   loratadine (CLARITIN) 10 MG tablet [id number] Take 10 mg by mouth daily. [provider] Taking Active   losartan (COZAAR) 100 MG tablet [id number] TOME UNA TABLETA TODOS LOS DIAS PiSusy FrizzleMD Taking Active   metoprolol succinate (TOPROL-XL) 25 MG 24 hr tablet [id number] TOME UNA TABLETA POR VIA ORAL TODOS LOS DIAS PiSusy FrizzleMD Taking Active   pantoprazole (PROTONIX) 40 MG tablet [id number] Take 1 tablet (40 mg total) by mouth daily. Please translate to Spanish. PiSusy FrizzleMD Taking Active   rosuvastatin (CRESTOR) 10 MG tablet [id number] Take 1 tablet (10 mg total) by mouth daily. PiSusy FrizzleMD Taking Active   solifenacin (VESICARE) 10 MG tablet [id number] TOME UNA TABLETA TODOS LOS DIAS PiSusy FrizzleMD Taking Active   traMADol (ULTRAM) 50 MG tablet [id number] Take 1 tablet (50 mg total) by mouth every 6 (six) hours as needed. PiSusy FrizzleMD Taking Active             Patient Active Problem List   Diagnosis Date Noted   Anemia due to chronic blood loss 09/27/2021   Constipation 09/27/2021   B12 deficiency 01/11/2020   Hypertension    Hyperlipidemia    Diabetes mellitus without complication (HCC)    GERD (gastroesophageal reflux disease)     Immunization History   Administered Date(s) Administered   Influenza,inj,Quad PF,6+ Mos 05/23/2015   Pneumococcal Conjugate-13 04/01/2017   Pneumococcal Polysaccharide-23 06/25/2010    Conditions to be addressed/monitored:  HTN, GERD, DM, HLD  There are no care plans that you recently modified to display for this patient.      Medication Assistance: None required.  Patient affirms current coverage meets needs.  Compliance/Adherence/Medication fill history: Care Gaps: Foot exam Eye exam  Star-Rating Drugs: Rosuvastatin 10 mg 90 DS 01/10/21  Patient's preferred pharmacy is:  [facility] [place] [address] [place]: [phone]: [id number] [place] [place]: [phone]: [phone] pill box? Yes - son organizes Pt endorses 100% compliance  We discussed: Benefits of medication synchronization, packaging and delivery as well as enhanced pharmacist oversight with [facility]. Patient decided to: Utilize [facility] for medication synchronization, packaging and delivery Verbal consent obtained for [facility] enhanced pharmacy services (medication synchronization, adherence packaging, delivery coordination). A medication sync plan was created to allow patient to get all medications delivered once every 30 to 90 days per patient preference. Patient understands they have freedom to choose pharmacy and clinical pharmacist will coordinate care between all prescribers and [facility].  Care Plan and Follow Up Patient Decision:  Patient agrees to Care Plan and Follow-up.  Plan: The care management team will reach out to the patient again over the next 180 days.  ChrBeverly MilchharmD Clinical Pharmacist [facility] [phone]  Barriers:  Unable to self administer  medications as prescribed  Pharmacist Clinical Goal(s):  Patient will achieve adherence to monitoring guidelines and medication adherence to achieve therapeutic efficacy adhere to prescribed medication regimen as evidenced by pill packs contact provider office for questions/concerns as evidenced notation of same in electronic health record through collaboration with PharmD and provider.   Interventions: 1:1 collaboration with Susy Frizzle, MD regarding development and update of comprehensive plan of care as evidenced by provider attestation and co-signature Inter-disciplinary care team collaboration (see longitudinal plan of care) Comprehensive medication review performed; medication list updated in electronic medical record  Hypertension  (Status:Goal on track: YES.)   Med Management Intervention: None  (BP goal <140/90) -Controlled -Current treatment: Losartan 141m daily Query Appropriate, Metoprolol XL 270mdaily Appropriate, Effective, Safe, Accessible -Medications previously tried: none noted  -Current home readings: checks periodically " normal per son" -Current dietary habits: eats about two meals per day, her diet is improving -Current exercise habits: minimal, walking hurts her hip -Denies hypotensive/hypertensive symptoms -Educated on BP goals and benefits of medications for prevention of heart attack, stroke and kidney damage; Importance of home blood pressure monitoring; Symptoms of hypotension and importance of maintaining adequate hydration; -Counseled to monitor BP at home a few times per week, document, and provide log at future appointments -Recommended to continue current medication  Update 09/20/21 Patient's son reports she is doing well, [place] - patient appeared to have bump in Cr back in October.  Patient son states PCP told him to go back on it and pharmacy  has been sending.  She has been on losartan.  I would recommend bringing her in for BMP to check kidney function to ensure it is safe for her continue.  Will coordinate with PCP. FU after labs on next steps.  Hyperlipidemia: (LDL goal < 70) -Controlled -Current treatment: Rosuvastatin 1037maily -Medications previously tried: none noted  -She was currently out of this medication due to a mix up at the pharmacy.  There has been some confusion on how many tablets she was actually given. -Educated on Cholesterol goals;  Benefits of statin for ASCVD risk reduction; Importance of limiting foods high in cholesterol; -Recommended to continue current medication Will help patient with adherence by setting them up with packaging.  Diabetes (A1c goal <6.5%) -Controlled -Current medications: None noted -Medications previously tried: none noted  -Current home glucose readings fasting glucose: not checking  post prandial glucose: not checking -Denies hypoglycemic/hyperglycemic symptoms -Educated on A1c and blood sugar goals; -Counseled to check feet daily and get yearly eye exams -Will consult with PCP if patient should be taking this med, unclear from chart history.  Update 09/20/21 Diabetes continues to be adequately controlled with no medications. Continue with current management, regular A1c screenings. Due for diabetic eye exam. No changes at this time.   GERD (Goal: Minimize symptoms) -Controlled -Current treatment  Pantoprazole 43m2mily -Medications previously tried: none noted -Patient will get symptoms if does not take this med.  Currently symptoms are controlled  -Recommended to continue current medication  Patient Goals/Self-Care Activities Patient will:  - focus on medication adherence by pill counts/packs check blood pressure periodically, document, and provide at future appointments Collaborate with provider to get synchronized into adherence packaging.  Follow Up  Plan: The care management team will reach out to the patient again over the next 180 days.

## 2022-03-25 ENCOUNTER — Ambulatory Visit: Payer: Medicare Other | Admitting: Family Medicine

## 2022-04-04 ENCOUNTER — Telehealth: Payer: Self-pay

## 2022-04-07 ENCOUNTER — Other Ambulatory Visit: Payer: Self-pay | Admitting: Family Medicine

## 2022-04-08 ENCOUNTER — Telehealth: Payer: Self-pay | Admitting: Pharmacist

## 2022-04-08 NOTE — Telephone Encounter (Signed)
Refilled 11/13/2021 #90 1 refill. Requested Prescriptions  Pending Prescriptions Disp Refills  . solifenacin (VESICARE) 10 MG tablet [Pharmacy Med Name: solifenacin 10 mg tablet] 90 tablet 1    Sig: TAKE ONE TABLET BY MOUTH EVERY MORNING     Urology:  Bladder Agents 2 Failed - 04/07/2022  8:02 AM      Failed - Cr in normal range and within 360 days    Creat  Date Value Ref Range Status  09/27/2021 1.26 (H) 0.60 - 0.95 mg/dL Final         Failed - Valid encounter within last 12 months    Recent Outpatient Visits          11 months ago Essential hypertension   [facility] Pickard, Priscille Heidelberg, MD   1 year ago Controlled type 2 diabetes mellitus without complication, without long-term current use of insulin (HCC)   [facility] Pickard, Priscille Heidelberg, MD   2 years ago B12 deficiency   [facility] Elmore Guise, FNP   2 years ago Epigastric pain   [facility] Pickard, Priscille Heidelberg, MD   3 years ago Essential hypertension   [facility] Pickard, Priscille Heidelberg, MD             Passed - ALT in normal range and within 360 days    ALT  Date Value Ref Range Status  09/27/2021 10 6 - 29 U/L Final         Passed - AST in normal range and within 360 days    AST  Date Value Ref Range Status  09/27/2021 14 10 - 35 U/L Final

## 2022-04-08 NOTE — Progress Notes (Unsigned)
    Chronic Care Management Pharmacy Assistant   Name: Claudia [name]  MRN: [id number] DOB: 09-25-1940   Reason for Encounter: Medication Coordination Call    Recent office visits:  None  Recent consult visits:  None  Hospital visits:  None in previous 6 months  Medications: Outpatient Encounter Medications as of 04/08/2022  Medication Sig   aspirin 81 MG tablet Take 1 tablet (81 mg total) by mouth daily.   [facility] VITAMIN B12 1000 MCG tablet TOME UNA TABLETA TODOS LOS DIAS   diclofenac sodium (VOLTAREN) 1 % GEL Apply 2 g topically 4 (four) times daily.   glucose blood (BAYER CONTOUR TEST) test strip Check BS QAM fasting   loratadine (CLARITIN) 10 MG tablet Take 10 mg by mouth daily.   losartan (COZAAR) 100 MG tablet TOME UNA TABLETA TODOS LOS DIAS   metoprolol succinate (TOPROL-XL) 25 MG 24 hr tablet TOME UNA TABLETA POR VIA ORAL TODOS LOS DIAS   pantoprazole (PROTONIX) 40 MG tablet Take 1 tablet (40 mg total) by mouth daily. Please translate to Spanish.   rosuvastatin (CRESTOR) 10 MG tablet Take 1 tablet (10 mg total) by mouth daily.   solifenacin (VESICARE) 10 MG tablet TOME UNA TABLETA TODOS LOS DIAS   traMADol (ULTRAM) 50 MG tablet Take 1 tablet (50 mg total) by mouth every 6 (six) hours as needed.   No facility-administered encounter medications on file as of 04/08/2022.   Reviewed chart for medication changes ahead of medication coordination call.  No OVs, Consults, or hospital visits since last care coordination call/Pharmacist visit.  No medication changes indicated.  BP Readings from Last 3 Encounters:  04/19/21 94/60  09/11/20 104/60  01/11/20 100/72    Lab Results  Component Value Date   HGBA1C 6.6 (H) 09/27/2021     Patient obtains medications through Adherence Packaging  30 Days   Last adherence delivery included: Vitamin B12 100 mcg 1 tablet at breakfast  Metoprolol ER 25 mg 1 tablet at breakfast  Pantoprazole 40 mg 1 tablet at breakfast  Rosuvastatin  10 mg 1 tablet at breakfast  Losartan 100 mg 1 tablet at breakfast Solifenacin 10 mg 1 tablet at breakfast  Patient is due for next adherence delivery on: 04/18/2022. Called patient and reviewed medications and coordinated delivery.  This delivery to include: Vitamin B12 100 mcg 1 tablet at breakfast  Metoprolol ER 25 mg 1 tablet at breakfast  Pantoprazole 40 mg 1 tablet at breakfast  Rosuvastatin 10 mg 1 tablet at breakfast  Losartan 100 mg 1 tablet at breakfast Solifenacin 10 mg 1 tablet at breakfast  Patient needs refills for: Solifenacin 10 mg 1 tablet at breakfast Losartan 100 mg 1 tablet at breakfast Metoprolol ER 25 mg 1 tablet at breakfast   Confirmed delivery date of 04/18/2022, advised patient that pharmacy will contact them the morning of delivery.  Care Gaps: Medicare Annual Wellness: Overdue Ophthalmology Exam: Overdue since 02/09/2021 Hemoglobin A1C: 6.6% on 09/27/2021 Colonoscopy: Aged out Dexa Scan: Overdue - never done Mammogram: Aged out  No future appointments.  April D Calhoun, [facility] Clinical Pharmacist Assistant [phone]

## 2022-04-09 ENCOUNTER — Other Ambulatory Visit: Payer: Self-pay

## 2022-04-09 DIAGNOSIS — R0789 Other chest pain: Secondary | ICD-10-CM

## 2022-04-09 NOTE — Telephone Encounter (Signed)
Requested medication (s) are due for refill today: yes  Requested medication (s) are on the active medication list: yes  Last refill:  losartan and metoprolol 03/11/22 #30/0, vesicare 11/13/21 #90/1  Future visit scheduled: no  Notes to clinic:  pt is overdue for appt. Had no show on 03/25/22. Please advise.      Requested Prescriptions  Pending Prescriptions Disp Refills   solifenacin (VESICARE) 10 MG tablet 90 tablet 1     Urology:  Bladder Agents 2 Failed - 04/09/2022  1:14 PM      Failed - Cr in normal range and within 360 days    Creat  Date Value Ref Range Status  09/27/2021 1.26 (H) 0.60 - 0.95 mg/dL Final         Failed - Valid encounter within last 12 months    Recent Outpatient Visits           11 months ago Essential hypertension   [facility] Donita Brooks, MD   1 year ago Controlled type 2 diabetes mellitus without complication, without long-term current use of insulin (HCC)   [facility] Donita Brooks, MD   2 years ago B12 deficiency   [facility] Jenne Pane, Rocky Crafts, FNP   2 years ago Epigastric pain   [facility] Tanya Nones, Priscille Heidelberg, MD   3 years ago Essential hypertension   [facility] Pickard, Priscille Heidelberg, MD              Passed - ALT in normal range and within 360 days    ALT  Date Value Ref Range Status  09/27/2021 10 6 - 29 U/L Final         Passed - AST in normal range and within 360 days    AST  Date Value Ref Range Status  09/27/2021 14 10 - 35 U/L Final          metoprolol succinate (TOPROL-XL) 25 MG 24 hr tablet 30 tablet 0    Sig: TOME UNA TABLETA POR VIA ORAL TODOS LOS DIAS     Cardiovascular:  Beta Blockers Failed - 04/09/2022  1:14 PM      Failed - Valid encounter within last 6 months    Recent Outpatient Visits           11 months ago Essential hypertension   [facility] Donita Brooks, MD   1 year ago Controlled type 2  diabetes mellitus without complication, without long-term current use of insulin (HCC)   [facility] Pickard, Priscille Heidelberg, MD   2 years ago B12 deficiency   [facility] Elmore Guise, FNP   2 years ago Epigastric pain   [facility] Pickard, Priscille Heidelberg, MD   3 years ago Essential hypertension   [facility] Pickard, Priscille Heidelberg, MD              Passed - Last BP in normal range    BP Readings from Last 1 Encounters:  04/19/21 94/60         Passed - Last Heart Rate in normal range    Pulse Readings from Last 1 Encounters:  04/19/21 68          losartan (COZAAR) 100 MG tablet 30 tablet 0    Sig: TOME UNA TABLETA TODOS LOS DIAS     Cardiovascular:  Angiotensin Receptor Blockers Failed - 04/09/2022  1:14 PM      Failed - Cr in normal range and within 180 days    Creat  Date Value Ref Range Status  09/27/2021 1.26 (H) 0.60 - 0.95 mg/dL Final         Failed - K in normal range and within 180 days    Potassium  Date Value Ref Range Status  09/27/2021 5.2 3.5 - 5.3 mmol/L Final         Failed - Valid encounter within last 6 months    Recent Outpatient Visits           11 months ago Essential hypertension   [facility] Donita Brooks, MD   1 year ago Controlled type 2 diabetes mellitus without complication, without long-term current use of insulin (HCC)   [facility] Pickard, Priscille Heidelberg, MD   2 years ago B12 deficiency   [facility] Elmore Guise, FNP   2 years ago Epigastric pain   [facility] Tanya Nones, Priscille Heidelberg, MD   3 years ago Essential hypertension   [facility] Pickard, Priscille Heidelberg, MD              Passed - Patient is not pregnant      Passed - Last BP in normal range    BP Readings from Last 1 Encounters:  04/19/21 94/60

## 2022-04-09 NOTE — Telephone Encounter (Signed)
-----   Message from April D Calhoun sent at 04/09/2022 12:34 PM EDT ----- Regarding: Rx refill request Patient has an upcoming delivery with [facility]. She will need a 30 DS of the following medications to complete her order:  Solifenacin 10 mg 1 tablet at breakfast Losartan 100 mg 1 tablet at breakfast MetoprololER25 mg 1 tablet at breakfast   Please send refills to [facility].  Thank you,  April D Calhoun, [facility] Clinical Pharmacist Assistant [phone]

## 2022-04-15 ENCOUNTER — Other Ambulatory Visit: Payer: Self-pay | Admitting: Family Medicine

## 2022-04-15 DIAGNOSIS — R0789 Other chest pain: Secondary | ICD-10-CM

## 2022-04-16 ENCOUNTER — Other Ambulatory Visit: Payer: Self-pay

## 2022-05-08 ENCOUNTER — Telehealth: Payer: Self-pay | Admitting: Pharmacist

## 2022-05-08 NOTE — Progress Notes (Unsigned)
    Chronic Care Management Pharmacy Assistant   Name: Consepcion Utt  MRN: [id number] DOB: 18-Jul-1941   Reason for Encounter: Medication Coordination Call    Recent office visits:  None  Recent consult visits:  None  Hospital visits:  None in previous 6 months  Medications: Outpatient Encounter Medications as of 05/08/2022  Medication Sig   aspirin 81 MG tablet Take 1 tablet (81 mg total) by mouth daily.   [facility] VITAMIN B12 1000 MCG tablet TOME UNA TABLETA TODOS LOS DIAS   diclofenac sodium (VOLTAREN) 1 % GEL Apply 2 g topically 4 (four) times daily.   glucose blood (BAYER CONTOUR TEST) test strip Check BS QAM fasting   loratadine (CLARITIN) 10 MG tablet Take 10 mg by mouth daily.   losartan (COZAAR) 100 MG tablet TOME UNA TABLETA TODOS LOS DIAS   metoprolol succinate (TOPROL-XL) 25 MG 24 hr tablet TOME UNA TABLETA POR VIA ORAL TODOS LOS DIAS   pantoprazole (PROTONIX) 40 MG tablet Take 1 tablet (40 mg total) by mouth daily. Please translate to Spanish.   rosuvastatin (CRESTOR) 10 MG tablet Take 1 tablet (10 mg total) by mouth daily.   solifenacin (VESICARE) 10 MG tablet TOME UNA TABLETA TODOS LOS DIAS   traMADol (ULTRAM) 50 MG tablet Take 1 tablet (50 mg total) by mouth every 6 (six) hours as needed.   No facility-administered encounter medications on file as of 05/08/2022.   Reviewed chart for medication changes ahead of medication coordination call.  No OVs, Consults, or hospital visits since last care coordination call/Pharmacist visit.   No medication changes indicated.  BP Readings from Last 3 Encounters:  04/19/21 94/60  09/11/20 104/60  01/11/20 100/72    Lab Results  Component Value Date   HGBA1C 6.6 (H) 09/27/2021     Patient obtains medications through Adherence Packaging  30 Days   Last adherence delivery included: Vitamin B12 100 mcg 1 tablet at breakfast  Metoprolol ER 25 mg 1 tablet at breakfast  Pantoprazole 40 mg 1 tablet at breakfast   Rosuvastatin 10 mg 1 tablet at breakfast  Losartan 100 mg 1 tablet at breakfast Solifenacin 10 mg 1 tablet at breakfast   Patient is due for next adherence delivery on: 05/20/2022. Called patient and reviewed medications and coordinated delivery.  This delivery to include: Vitamin B12 100 mcg 1 tablet at breakfast  Metoprolol ER 25 mg 1 tablet at breakfast  Pantoprazole 40 mg 1 tablet at breakfast  Rosuvastatin 10 mg 1 tablet at breakfast  Losartan 100 mg 1 tablet at breakfast Solifenacin 10 mg 1 tablet at breakfast   Patient needs refills for: Losartan 100 mg 1 tablet at breakfast Solifenacin 10 mg 1 tablet at breakfast Metoprolol ER 25 mg 1 tablet at breakfast  Pantoprazole 40 mg 1 tablet at breakfast   Confirmed delivery date of 05/20/2022, advised patient that pharmacy will contact them the morning of delivery.   Care Gaps: Medicare Annual Wellness: Overdue Ophthalmology Exam: Overdue since 02/09/2021 Hemoglobin A1C: 6.6% on 09/27/2021 Colonoscopy: Aged out Dexa Scan: Overdue - never done Mammogram: Aged out  Future Appointments  Date Time Provider [place]  05/13/2022 12:15 PM Susy Frizzle, MD [facility]   April D Calhoun, [place] Pharmacist Assistant [phone]

## 2022-05-09 ENCOUNTER — Other Ambulatory Visit: Payer: Self-pay | Admitting: Family Medicine

## 2022-05-09 DIAGNOSIS — R0789 Other chest pain: Secondary | ICD-10-CM

## 2022-05-09 NOTE — Telephone Encounter (Signed)
-----   Message from April D Calhoun sent at 05/09/2022 12:31 PM EDT ----- Regarding: Rx refill request Patient has an upcoming delivery with [facility]. She will need a 30 DS of the following medications to complete her order:  Losartan 100 mg 1 tablet at breakfast Solifenacin 10 mg 1 tablet at breakfast MetoprololER25 mg 1 tablet at breakfast  Pantoprazole 40 mg 1 tablet at breakfast   Please send refills to [facility].  Thank you,  April D Calhoun, [place] Pharmacist Assistant [phone]

## 2022-05-10 MED ORDER — METOPROLOL SUCCINATE ER 25 MG PO TB24: ORAL_TABLET | ORAL | 0 refills | Status: DC

## 2022-05-10 MED ORDER — LOSARTAN POTASSIUM 100 MG PO TABS
ORAL_TABLET | ORAL | 0 refills | Status: DC
Start: 2022-05-10 — End: 2022-05-13

## 2022-05-10 MED ORDER — PANTOPRAZOLE SODIUM 40 MG PO TBEC: 40.0000 mg | DELAYED_RELEASE_TABLET | Freq: Every day | ORAL | 0 refills | Status: DC

## 2022-05-10 MED ORDER — SOLIFENACIN SUCCINATE 10 MG PO TABS
ORAL_TABLET | ORAL | 0 refills | Status: DC
Start: 2022-05-10 — End: 2022-05-13

## 2022-05-10 NOTE — Telephone Encounter (Signed)
Requested Prescriptions  Pending Prescriptions Disp Refills  . losartan (COZAAR) 100 MG tablet 30 tablet 0    Sig: TOME UNA TABLETA TODOS LOS DIAS     Cardiovascular:  Angiotensin Receptor Blockers Failed - 05/09/2022  4:43 PM      Failed - Cr in normal range and within 180 days    Creat  Date Value Ref Range Status  09/27/2021 1.26 (H) 0.60 - 0.95 mg/dL Final         Failed - K in normal range and within 180 days    Potassium  Date Value Ref Range Status  09/27/2021 5.2 3.5 - 5.3 mmol/L Final         Failed - Valid encounter within last 6 months    Recent Outpatient Visits          1 year ago Essential hypertension   [facility] Donita Brooks, MD   1 year ago Controlled type 2 diabetes mellitus without complication, without long-term current use of insulin (HCC)   [facility] Pickard, Priscille Heidelberg, MD   2 years ago B12 deficiency   [facility] Jenne Pane, Rocky Crafts, FNP   2 years ago Epigastric pain   [facility] Tanya Nones, Priscille Heidelberg, MD   3 years ago Essential hypertension   [facility] Pickard, Priscille Heidelberg, MD      Future Appointments            In 3 days Tanya Nones, Priscille Heidelberg, MD [facility], [facility]           Passed - Patient is not pregnant      Passed - Last BP in normal range    BP Readings from Last 1 Encounters:  04/19/21 94/60         . solifenacin (VESICARE) 10 MG tablet 30 tablet 0     Urology:  Bladder Agents 2 Failed - 05/09/2022  4:43 PM      Failed - Cr in normal range and within 360 days    Creat  Date Value Ref Range Status  09/27/2021 1.26 (H) 0.60 - 0.95 mg/dL Final         Failed - Valid encounter within last 12 months    Recent Outpatient Visits          1 year ago Essential hypertension   [facility] Medicine Donita Brooks, MD   1 year ago Controlled type 2 diabetes mellitus without complication, without long-term current use of insulin  (HCC)   [facility] Pickard, Priscille Heidelberg, MD   2 years ago B12 deficiency   [facility] Medicine Elmore Guise, FNP   2 years ago Epigastric pain   [facility] Pickard, Priscille Heidelberg, MD   3 years ago Essential hypertension   [facility] Pickard, Priscille Heidelberg, MD      Future Appointments            In 3 days Pickard, Priscille Heidelberg, MD [facility], [facility]           Passed - ALT in normal range and within 360 days    ALT  Date Value Ref Range Status  09/27/2021 10 6 - 29 U/L Final         Passed - AST in normal range and within 360 days    AST  Date Value Ref Range Status  09/27/2021 14  10 - 35 U/L Final         . pantoprazole (PROTONIX) 40 MG tablet 30 tablet 0    Sig: Take 1 tablet (40 mg total) by mouth daily. Please translate to Spanish.     Gastroenterology: Proton Pump Inhibitors Failed - 05/09/2022  4:43 PM      Failed - Valid encounter within last 12 months    Recent Outpatient Visits          1 year ago Essential hypertension   [place], Cammie Mcgee, MD   1 year ago Controlled type 2 diabetes mellitus without complication, without long-term current use of insulin ([place])   [place] Pickard, Cammie Mcgee, MD   2 years ago B12 deficiency   [place], [place], FNP   2 years ago Epigastric pain   [place] Schaumann, Cammie Mcgee, MD   3 years ago Essential hypertension   [place], Cammie Mcgee, MD      Future Appointments            In 3 days [place] Schaumann, Cammie Mcgee, MD [place], [facility]           . metoprolol succinate (TOPROL-XL) 25 MG 24 hr tablet 30 tablet 0    Sig: TOME UNA TABLETA POR VIA ORAL TODOS LOS DIAS     Cardiovascular:  Beta Blockers Failed - 05/09/2022  4:43 PM      Failed - Valid encounter within last 6 months    Recent Outpatient Visits          1 year ago  Essential hypertension   [place], Warren T, MD   1 year ago Controlled type 2 diabetes mellitus without complication, without long-term current use of insulin ([place])   [place] Pickard, Cammie Mcgee, MD   2 years ago B12 deficiency   [place], [place], FNP   2 years ago Epigastric pain   [place] Schaumann, Cammie Mcgee, MD   3 years ago Essential hypertension   [place], Cammie Mcgee, MD      Future Appointments            In 3 days Denn[place] Schaumann, Cammie Mcgee, MD [place], [facility]           Passed - Last BP in normal range    BP Readings from Last 1 Encounters:  04/19/21 94/60         Passed - Last Heart Rate in normal range    Pulse Readings from Last 1 Encounters:  04/19/21 68

## 2022-05-13 ENCOUNTER — Ambulatory Visit (INDEPENDENT_AMBULATORY_CARE_PROVIDER_SITE_OTHER): Payer: Medicare Other | Admitting: Family Medicine

## 2022-05-13 VITALS — BP 124/72 | HR 75 | Temp 97.5°F | Ht 62.0 in | Wt 149.0 lb

## 2022-05-13 DIAGNOSIS — E119 Type 2 diabetes mellitus without complications: Secondary | ICD-10-CM | POA: Diagnosis not present

## 2022-05-13 DIAGNOSIS — I10 Essential (primary) hypertension: Secondary | ICD-10-CM | POA: Diagnosis not present

## 2022-05-13 MED ORDER — TRAMADOL HCL 50 MG PO TABS: 50.0000 mg | ORAL_TABLET | Freq: Four times a day (QID) | ORAL | 4 refills | Status: DC | PRN

## 2022-05-13 MED ORDER — PANTOPRAZOLE SODIUM 40 MG PO TBEC: 40.0000 mg | DELAYED_RELEASE_TABLET | Freq: Every day | ORAL | 3 refills | Status: DC

## 2022-05-13 MED ORDER — ROSUVASTATIN CALCIUM 10 MG PO TABS: 10.0000 mg | ORAL_TABLET | Freq: Every day | ORAL | 3 refills | Status: DC

## 2022-05-13 MED ORDER — LOSARTAN POTASSIUM 100 MG PO TABS
ORAL_TABLET | ORAL | 0 refills | Status: DC
Start: 2022-05-13 — End: 2022-07-08

## 2022-05-13 MED ORDER — SOLIFENACIN SUCCINATE 10 MG PO TABS: ORAL_TABLET | ORAL | 3 refills | Status: DC

## 2022-05-13 MED ORDER — METOPROLOL SUCCINATE ER 25 MG PO TB24: ORAL_TABLET | ORAL | 3 refills | Status: DC

## 2022-05-13 NOTE — Progress Notes (Signed)
Subjective:    Patient ID: Claudia [name], female    DOB: 1941/06/24, 81 y.o.   MRN: [id number]  Patient is here today with her son.  He does the majority of the speaking for her due to language barrier.  They deny any problems.  Specifically she denies any chest pain or shortness of breath or dyspnea on exertion.  Her [name] pressure today is well controlled at 124/72.  She denies any dizziness or lightheadedness or syncope or near syncope.  She denies any polyuria polydipsia or blurry vision.  She is overdue for fasting lab work to monitor her diabetes.  She denies any weight loss.  She does occasionally have knee pain in her left knee over the medial joint line and posterior right hip pain however Tylenol and the occasional tramadol seems to help with this.  She denies any falls.  She denies any memory loss.  She denies any depression.  She is due for a flu shot today. Past Medical History:  Diagnosis Date   Anemia    Arthritis    B12 deficiency 01/11/2020   Blind right eye    Diabetes mellitus without complication (HCC)    GERD (gastroesophageal reflux disease)    chronic gastritis   Hyperlipidemia    Hypertension    Past Surgical History:  Procedure Laterality Date   HERNIA REPAIR     INDUCED ABORTION     Current Outpatient Medications on File Prior to Visit  Medication Sig Dispense Refill   aspirin 81 MG tablet Take 1 tablet (81 mg total) by mouth daily. 30 tablet 11   [facility]S VITAMIN B12 1000 MCG tablet TOME UNA TABLETA TODOS LOS DIAS 200 tablet 5   diclofenac sodium (VOLTAREN) 1 % GEL Apply 2 g topically 4 (four) times daily. 100 g 2   glucose [name] (BAYER CONTOUR TEST) test strip Check BS QAM fasting 50 each 5   loratadine (CLARITIN) 10 MG tablet Take 10 mg by mouth daily.     losartan (COZAAR) 100 MG tablet TOME UNA TABLETA TODOS LOS DIAS 30 tablet 0   metoprolol succinate (TOPROL-XL) 25 MG 24 hr tablet TOME UNA TABLETA POR VIA ORAL TODOS LOS DIAS 30 tablet 0   pantoprazole  (PROTONIX) 40 MG tablet Take 1 tablet (40 mg total) by mouth daily. Please translate to Spanish. 30 tablet 0   rosuvastatin (CRESTOR) 10 MG tablet Take 1 tablet (10 mg total) by mouth daily. 90 tablet 3   solifenacin (VESICARE) 10 MG tablet TOME UNA TABLETA TODOS LOS DIAS 30 tablet 0   traMADol (ULTRAM) 50 MG tablet Take 1 tablet (50 mg total) by mouth every 6 (six) hours as needed. 60 tablet 4   No current facility-administered medications on file prior to visit.   No Known Allergies Social History   Socioeconomic History   Marital status: Single    Spouse name: Not on file   Number of children: 4   Years of education: Not on file   Highest education level: Not on file  Occupational History   Not on file  Tobacco Use   Smoking status: Former    Types: Cigarettes    Quit date: 48    Years since quitting: 42.7   Smokeless tobacco: Never  Vaping Use   Vaping Use: Never used  Substance and Sexual Activity   Alcohol use: No   Drug use: No   Sexual activity: Not Currently  Other Topics Concern   Not on file  Social  History Narrative   Not on file   Social Determinants of Health   Financial Resource Strain: Low Risk  (03/15/2021)   Overall Financial Resource Strain (CARDIA)    Difficulty of Paying Living Expenses: Not very hard  Food Insecurity: Not on file  Transportation Needs: Not on file  Physical Activity: Not on file  Stress: Not on file  Social Connections: Not on file  Intimate Partner Violence: Not on file     Review of Systems  All other systems reviewed and are negative.      Objective:   Physical Exam Vitals reviewed.  Constitutional:      Appearance: She is well-developed.  Eyes:     Conjunctiva/sclera: Conjunctivae normal.     Pupils: Pupils are equal, round, and reactive to light.  Neck:     Thyroid: No thyromegaly.     Vascular: No JVD.  Cardiovascular:     Rate and Rhythm: Normal rate and regular rhythm.     Heart sounds: Normal heart  sounds. No murmur heard.    No friction rub. No gallop.  Pulmonary:     Effort: Pulmonary effort is normal. No respiratory distress.     Breath sounds: Normal breath sounds. No wheezing or rales.  Abdominal:     General: Bowel sounds are normal. There is no distension.     Palpations: Abdomen is soft. There is no mass.     Tenderness: There is no abdominal tenderness. There is no guarding or rebound.  Musculoskeletal:     Cervical back: Neck supple.  Lymphadenopathy:     Cervical: No cervical adenopathy.           Assessment & Plan:  Controlled type 2 diabetes mellitus without complication, without long-term current use of insulin (HCC) - Plan: CBC with Differential/Platelet, Lipid panel, COMPLETE METABOLIC PANEL WITH GFR, Hemoglobin A1c  Benign essential HTN - Plan: metoprolol succinate (TOPROL-XL) 25 MG 24 hr tablet  Her [name] pressure looks excellent.  I will make no changes in her antihypertensives.  Continue her on losartan and metoprolol.  I will check a fasting lipid panel.  Ideally I would like her cholesterol/LDL cholesterol to be below 100.  I will also monitor her hemoglobin A1c.  Goal hemoglobin A1c is less than 6.5.  Check a CBC to monitor for anemia.  Continue to use Tylenol and tramadol for hip pain and low back pain.  There is no evidence of any falls or depression or significant memory loss.  She received her flu shot today.

## 2022-05-14 LAB — COMPLETE METABOLIC PANEL WITH GFR
AG Ratio: 1.3 (calc) (ref 1.0–2.5)
ALT: 7 U/L (ref 6–29)
AST: 13 U/L (ref 10–35)
Albumin: 3.9 g/dL (ref 3.6–5.1)
Alkaline phosphatase (APISO): 89 U/L (ref 37–153)
BUN/Creatinine Ratio: 13 (calc) (ref 6–22)
BUN: 25 mg/dL (ref 7–25)
CO2: 21 mmol/L (ref 20–32)
Calcium: 9.1 mg/dL (ref 8.6–10.4)
Chloride: 110 mmol/L (ref 98–110)
Creat: 1.89 mg/dL — ABNORMAL HIGH (ref 0.60–0.95)
Globulin: 2.9 g/dL (calc) (ref 1.9–3.7)
Glucose, Bld: 106 mg/dL — ABNORMAL HIGH (ref 65–99)
Potassium: 5.3 mmol/L (ref 3.5–5.3)
Sodium: 141 mmol/L (ref 135–146)
Total Bilirubin: 0.5 mg/dL (ref 0.2–1.2)
Total Protein: 6.8 g/dL (ref 6.1–8.1)
eGFR: 26 mL/min/{1.73_m2} — ABNORMAL LOW (ref >=60)

## 2022-05-14 LAB — LIPID PANEL
Cholesterol: 135 mg/dL (ref <200)
HDL: 43 mg/dL — ABNORMAL LOW (ref >=50)
LDL Cholesterol (Calc): 72 mg/dL (calc)
Non-HDL Cholesterol (Calc): 92 mg/dL (calc) (ref <130)
Total CHOL/HDL Ratio: 3.1 (calc) (ref <5.0)
Triglycerides: 114 mg/dL (ref <150)

## 2022-05-14 LAB — CBC WITH DIFFERENTIAL/PLATELET
Absolute Monocytes: 552 cells/uL (ref 200–950)
Basophils Absolute: 37 cells/uL (ref 0–200)
Basophils Relative: 0.6 %
Eosinophils Absolute: 112 cells/uL (ref 15–500)
Eosinophils Relative: 1.8 %
HCT: 38.3 % (ref 35.0–45.0)
Hemoglobin: 12.1 g/dL (ref 11.7–15.5)
Lymphs Abs: 1910 cells/uL (ref 850–3900)
MCH: 27.3 pg (ref 27.0–33.0)
MCHC: 31.6 g/dL — ABNORMAL LOW (ref 32.0–36.0)
MCV: 86.3 fL (ref 80.0–100.0)
MPV: 11.2 fL (ref 7.5–12.5)
Monocytes Relative: 8.9 %
Neutro Abs: 3590 cells/uL (ref 1500–7800)
Neutrophils Relative %: 57.9 %
Platelets: 228 10*3/uL (ref 140–400)
RBC: 4.44 10*6/uL (ref 3.80–5.10)
RDW: 14.8 % (ref 11.0–15.0)
Total Lymphocyte: 30.8 %
WBC: 6.2 10*3/uL (ref 3.8–10.8)

## 2022-05-14 LAB — HEMOGLOBIN A1C
Hgb A1c MFr Bld: 6.2 % of total Hgb — ABNORMAL HIGH (ref <5.7)
Mean Plasma Glucose: 131 mg/dL
eAG (mmol/L): 7.3 mmol/L

## 2022-06-07 ENCOUNTER — Telehealth: Payer: Self-pay | Admitting: Pharmacist

## 2022-06-07 NOTE — Progress Notes (Signed)
    Chronic Care Management Pharmacy Assistant   Name: Claudia [name]  MRN: [id number] DOB: 1941-08-06   Reason for Encounter: Medication Coordination Call    Recent office visits:  05/13/2022 [facility] (PCP) Susy Frizzle, MD; no medication changes indicated.  Recent consult visits:  None  Hospital visits:  None in previous 6 months  Medications: Outpatient Encounter Medications as of 06/07/2022  Medication Sig   aspirin 81 MG tablet Take 1 tablet (81 mg total) by mouth daily.   CVS VITAMIN B12 1000 MCG tablet TOME UNA TABLETA TODOS LOS DIAS   diclofenac sodium (VOLTAREN) 1 % GEL Apply 2 g topically 4 (four) times daily.   glucose blood (BAYER CONTOUR TEST) test strip Check BS QAM fasting   loratadine (CLARITIN) 10 MG tablet Take 10 mg by mouth daily.   losartan (COZAAR) 100 MG tablet TOME UNA TABLETA TODOS LOS DIAS   metoprolol succinate (TOPROL-XL) 25 MG 24 hr tablet TOME UNA TABLETA POR VIA ORAL TODOS LOS DIAS   pantoprazole (PROTONIX) 40 MG tablet Take 1 tablet (40 mg total) by mouth daily. Please translate to Spanish.   rosuvastatin (CRESTOR) 10 MG tablet Take 1 tablet (10 mg total) by mouth daily.   solifenacin (VESICARE) 10 MG tablet TOME UNA TABLETA TODOS LOS DIAS   traMADol (ULTRAM) 50 MG tablet Take 1 tablet (50 mg total) by mouth every 6 (six) hours as needed.   No facility-administered encounter medications on file as of 06/07/2022.   Reviewed chart for medication changes ahead of medication coordination call.  BP Readings from Last 3 Encounters:  05/13/22 124/72  04/19/21 94/60  09/11/20 104/60    Lab Results  Component Value Date   HGBA1C 6.2 (H) 05/13/2022     Patient obtains medications through Adherence Packaging  30 Days   Last adherence delivery included:  Vitamin B12 100 mcg 1 tablet at breakfast  Metoprolol ER 25 mg 1 tablet at breakfast  Pantoprazole 40 mg 1 tablet at breakfast  Rosuvastatin 10 mg 1 tablet at breakfast  Losartan 100 mg 1  tablet at breakfast Solifenacin 10 mg 1 tablet at breakfast  Patient is due for next adherence delivery on: 06/18/2022. Called patient and reviewed medications and coordinated delivery.  This delivery to include: Vitamin B12 100 mcg 1 tablet at breakfast  Metoprolol ER 25 mg 1 tablet at breakfast  Pantoprazole 40 mg 1 tablet at breakfast  Rosuvastatin 10 mg 1 tablet at breakfast  Losartan 100 mg 1 tablet at breakfast Solifenacin 10 mg 1 tablet at breakfast Tramadol 50 mg 1 tab every 6 hours prn  Confirmed delivery date of 06/18/2022, advised patient that pharmacy will contact them the morning of delivery.   Care Gaps: Medicare Annual Wellness: Declined Ophthalmology Exam: Overdue since 02/09/2021 Hemoglobin A1C: 6.2% on 05/13/2022 Colonoscopy: Aged out Dexa Scan: Overdue - never done Mammogram: Aged out  Future Appointments  Date Time Provider [place]  11/15/2022  2:00 PM [facility]  05/19/2023 10:30 AM [place], Cammie Mcgee, MD [facility]   April D Calhoun, [place] Pharmacist Assistant [phone]

## 2022-07-08 ENCOUNTER — Other Ambulatory Visit: Payer: Self-pay

## 2022-07-08 ENCOUNTER — Telehealth: Payer: Self-pay | Admitting: Pharmacist

## 2022-07-08 MED ORDER — LOSARTAN POTASSIUM 100 MG PO TABS: ORAL_TABLET | ORAL | 0 refills | Status: DC

## 2022-07-08 MED ORDER — TRAMADOL HCL 50 MG PO TABS: 50.0000 mg | ORAL_TABLET | Freq: Four times a day (QID) | ORAL | 4 refills | Status: DC | PRN

## 2022-07-08 NOTE — Progress Notes (Signed)
    Chronic Care Management Pharmacy Assistant   Name: Aaylah Pokorny  MRN: [id number] DOB: 11-May-1941   Reason for Encounter: Medication Coordination Call    Recent office visits:  None  Recent consult visits:  None  Hospital visits:  None in previous 6 months  Medications: Outpatient Encounter Medications as of 07/08/2022  Medication Sig   aspirin 81 MG tablet Take 1 tablet (81 mg total) by mouth daily.   CVS VITAMIN B12 1000 MCG tablet TOME UNA TABLETA TODOS LOS DIAS   diclofenac sodium (VOLTAREN) 1 % GEL Apply 2 g topically 4 (four) times daily.   glucose blood (BAYER CONTOUR TEST) test strip Check BS QAM fasting   loratadine (CLARITIN) 10 MG tablet Take 10 mg by mouth daily.   losartan (COZAAR) 100 MG tablet TOME UNA TABLETA TODOS LOS DIAS   metoprolol succinate (TOPROL-XL) 25 MG 24 hr tablet TOME UNA TABLETA POR VIA ORAL TODOS LOS DIAS   pantoprazole (PROTONIX) 40 MG tablet Take 1 tablet (40 mg total) by mouth daily. Please translate to Spanish.   rosuvastatin (CRESTOR) 10 MG tablet Take 1 tablet (10 mg total) by mouth daily.   solifenacin (VESICARE) 10 MG tablet TOME UNA TABLETA TODOS LOS DIAS   traMADol (ULTRAM) 50 MG tablet Take 1 tablet (50 mg total) by mouth every 6 (six) hours as needed.   No facility-administered encounter medications on file as of 07/08/2022.   Reviewed chart for medication changes ahead of medication coordination call.  No medication changes indicated.  BP Readings from Last 3 Encounters:  05/13/22 124/72  04/19/21 94/60  09/11/20 104/60    Lab Results  Component Value Date   HGBA1C 6.2 (H) 05/13/2022     Patient obtains medications through Adherence Packaging  30 Days   Last adherence delivery included:  Vitamin B12 100 mcg 1 tablet at breakfast  Metoprolol ER 25 mg 1 tablet at breakfast  Pantoprazole 40 mg 1 tablet at breakfast  Rosuvastatin 10 mg 1 tablet at breakfast  Losartan 100 mg 1 tablet at breakfast Solifenacin 10 mg 1  tablet at breakfast  Patient is due for next adherence delivery on: 07/17/2022. Called patient and reviewed medications and coordinated delivery.  This delivery to include: Vitamin B12 100 mcg 1 tablet at breakfast  Metoprolol ER 25 mg 1 tablet at breakfast  Pantoprazole 40 mg 1 tablet at breakfast  Rosuvastatin 10 mg 1 tablet at breakfast  Losartan 100 mg 1 tablet at breakfast Solifenacin 10 mg 1 tablet at breakfast Tramadol 50 mg  Patient needs refills for: Losartan 100 mg 1 tablet at breakfast Tramadol 50 mg  Confirmed delivery date of 07/17/2022, advised patient that pharmacy will contact them the morning of delivery.   Care Gaps: Medicare Annual Wellness: Declined Ophthalmology Exam: Overdue since 02/09/2021 Hemoglobin A1C: 6.2% on 05/13/2022 Colonoscopy: Aged out Dexa Scan: Overdue - never done Mammogram: Aged out  Future Appointments  Date Time Provider Department Center  11/15/2022  2:00 PM [facility]  05/19/2023 10:30 AM Pickard, Priscille Heidelberg, MD [facility]   April D Calhoun, [facility] Clinical Pharmacist Assistant [phone]

## 2022-08-12 ENCOUNTER — Other Ambulatory Visit: Payer: Self-pay | Admitting: Family Medicine

## 2022-09-05 ENCOUNTER — Other Ambulatory Visit: Payer: Self-pay

## 2022-09-05 ENCOUNTER — Telehealth: Payer: Self-pay | Admitting: Pharmacist

## 2022-09-05 MED ORDER — LOSARTAN POTASSIUM 100 MG PO TABS: ORAL_TABLET | ORAL | 0 refills | Status: DC

## 2022-09-05 NOTE — Progress Notes (Signed)
Care Management & Coordination Services Pharmacy Team  Reason for Encounter: Medication coordination and delivery  Contacted patient to discuss medications and coordinate delivery from [facility]. Spoke with family on 09/05/2022  Cycle dispensing form sent to Leata Mouse for review.   Last adherence delivery date: 07/17/2022      Patient is due for next adherence delivery on: 09/16/2022  This delivery to include: Adherence Packaging  30 Days  Vitamin B12 100 mcg 1 tablet at breakfast  Metoprolol ER 25 mg 1 tablet at breakfast  Pantoprazole 40 mg 1 tablet at breakfast  Rosuvastatin 10 mg 1 tablet at breakfast  Losartan 100 mg 1 tablet at breakfast Solifenacin 10 mg 1 tablet at breakfast Tramadol 50 mg   Refills requested from providers include: Losartan 100 mg daily  Confirmed delivery date of 09/16/2022, advised patient that pharmacy will contact them the morning of delivery.   Any concerns about your medications? No  How often do you forget or accidentally miss a dose? Never  Do you use a pillbox? No  Is patient in packaging Yes  Any concerns or issues with your packaging? No    Chart review: Recent office visits:  None  Recent consult visits:  None  Hospital visits:  None in previous 6 months  Medications: Outpatient Encounter Medications as of 09/05/2022  Medication Sig   aspirin 81 MG tablet Take 1 tablet (81 mg total) by mouth daily.   CVS VITAMIN B12 1000 MCG tablet TOME UNA TABLETA TODOS LOS DIAS   diclofenac sodium (VOLTAREN) 1 % GEL Apply 2 g topically 4 (four) times daily.   glucose blood (BAYER CONTOUR TEST) test strip Check BS QAM fasting   loratadine (CLARITIN) 10 MG tablet Take 10 mg by mouth daily.   losartan (COZAAR) 100 MG tablet TAKE ONE TABLET BY MOUTH EVERY MORNING Patient need office visit with PCP for further refills   metoprolol succinate (TOPROL-XL) 25 MG 24 hr tablet TOME UNA TABLETA POR VIA ORAL TODOS LOS DIAS   pantoprazole  (PROTONIX) 40 MG tablet Take 1 tablet (40 mg total) by mouth daily. Please translate to Spanish.   rosuvastatin (CRESTOR) 10 MG tablet Take 1 tablet (10 mg total) by mouth daily.   solifenacin (VESICARE) 10 MG tablet TOME UNA TABLETA TODOS LOS DIAS   traMADol (ULTRAM) 50 MG tablet Take 1 tablet (50 mg total) by mouth every 6 (six) hours as needed.   No facility-administered encounter medications on file as of 09/05/2022.   BP Readings from Last 3 Encounters:  05/13/22 124/72  04/19/21 94/60  09/11/20 104/60    Pulse Readings from Last 3 Encounters:  05/13/22 75  04/19/21 68  09/11/20 65    Lab Results  Component Value Date/Time   HGBA1C 6.2 (H) 05/13/2022 12:26 PM   HGBA1C 6.6 (H) 09/27/2021 11:11 AM   Lab Results  Component Value Date   CREATININE 1.89 (H) 05/13/2022   BUN 25 05/13/2022   GFRNONAA 47 (L) 09/11/2020   GFRAA 54 (L) 09/11/2020   NA 141 05/13/2022   K 5.3 05/13/2022   CALCIUM 9.1 05/13/2022   CO2 21 05/13/2022   Future Appointments  Date Time Provider [place]  11/15/2022  2:00 PM [facility]  05/19/2023 10:30 AM Susy Frizzle, MD [facility]     April D Calhoun, [place] Pharmacist Assistant [phone]

## 2022-10-04 ENCOUNTER — Telehealth: Payer: Self-pay | Admitting: Pharmacist

## 2022-10-04 ENCOUNTER — Other Ambulatory Visit: Payer: Self-pay

## 2022-10-04 DIAGNOSIS — I10 Essential (primary) hypertension: Secondary | ICD-10-CM

## 2022-10-04 MED ORDER — LOSARTAN POTASSIUM 100 MG PO TABS: ORAL_TABLET | ORAL | 3 refills | Status: DC

## 2022-10-04 NOTE — Progress Notes (Signed)
Care Management & Coordination Services Pharmacy Team  Reason for Encounter: Medication coordination and delivery  Contacted patient to discuss medications and coordinate delivery from [facility]. Spoke with family on 10/04/2022  Cycle dispensing form sent to Leata Mouse for review.   Last adherence delivery date: 09/16/2022      Patient is due for next adherence delivery on: 10/16/2022  This delivery to include: Adherence Packaging  30 Days  Vitamin B12 100 mcg 1 tablet at breakfast  Metoprolol ER 25 mg 1 tablet at breakfast  Pantoprazole 40 mg 1 tablet at breakfast  Rosuvastatin 10 mg 1 tablet at breakfast  Losartan 100 mg 1 tablet at breakfast Solifenacin 10 mg 1 tablet at breakfast Tramadol 50 mg   Refills requested from providers include: Losartan 100 mg  Confirmed delivery date of 10/16/2022, advised patient that pharmacy will contact them the morning of delivery.   Any concerns about your medications? No  How often do you forget or accidentally miss a dose? Never  Do you use a pillbox? No  Is patient in packaging Yes  If yes  Any concerns or issues with your packaging? No    Chart review: Recent office visits:  None since last medication coordination call  Recent consult visits:  None since last medication coordination call  Hospital visits:  None in previous 6 months  Medications: Outpatient Encounter Medications as of 10/04/2022  Medication Sig   aspirin 81 MG tablet Take 1 tablet (81 mg total) by mouth daily.   [facility] VITAMIN B12 1000 MCG tablet TOME UNA TABLETA TODOS LOS DIAS   diclofenac sodium (VOLTAREN) 1 % GEL Apply 2 g topically 4 (four) times daily.   glucose blood (BAYER CONTOUR TEST) test strip Check BS QAM fasting   loratadine (CLARITIN) 10 MG tablet Take 10 mg by mouth daily.   losartan (COZAAR) 100 MG tablet TAKE ONE TABLET BY MOUTH EVERY MORNING Patient need office visit with PCP for further refills   metoprolol succinate (TOPROL-XL) 25 MG  24 hr tablet TOME UNA TABLETA POR VIA ORAL TODOS LOS DIAS   pantoprazole (PROTONIX) 40 MG tablet Take 1 tablet (40 mg total) by mouth daily. Please translate to Spanish.   rosuvastatin (CRESTOR) 10 MG tablet Take 1 tablet (10 mg total) by mouth daily.   solifenacin (VESICARE) 10 MG tablet TOME UNA TABLETA TODOS LOS DIAS   traMADol (ULTRAM) 50 MG tablet Take 1 tablet (50 mg total) by mouth every 6 (six) hours as needed.   No facility-administered encounter medications on file as of 10/04/2022.   BP Readings from Last 3 Encounters:  05/13/22 124/72  04/19/21 94/60  09/11/20 104/60    Pulse Readings from Last 3 Encounters:  05/13/22 75  04/19/21 68  09/11/20 65    Lab Results  Component Value Date/Time   HGBA1C 6.2 (H) 05/13/2022 12:26 PM   HGBA1C 6.6 (H) 09/27/2021 11:11 AM   Lab Results  Component Value Date   CREATININE 1.89 (H) 05/13/2022   BUN 25 05/13/2022   GFRNONAA 47 (L) 09/11/2020   GFRAA 54 (L) 09/11/2020   NA 141 05/13/2022   K 5.3 05/13/2022   CALCIUM 9.1 05/13/2022   CO2 21 05/13/2022     Future Appointments  Date Time Provider [place]  11/15/2022  2:00 PM [facility]  05/19/2023 10:30 AM Susy Frizzle, MD [facility]   April D Calhoun, [place] Pharmacist Assistant [phone]

## 2022-11-04 ENCOUNTER — Telehealth: Payer: Self-pay | Admitting: Pharmacist

## 2022-11-04 NOTE — Progress Notes (Unsigned)
Care Management & Coordination Services Pharmacy Team  Reason for Encounter: Medication coordination and delivery  Contacted patient to discuss medications and coordinate delivery from [facility]. {US HC Outreach:28874} Cycle dispensing form sent to *** for review.   Last adherence delivery date: 10/16/2022      Patient is due for next adherence delivery on: 11/14/2022  This delivery to include: Adherence Packaging  30 Days  Vitamin B12 100 mcg 1 tablet at breakfast  Metoprolol ER 25 mg 1 tablet at breakfast  Pantoprazole 40 mg 1 tablet at breakfast  Rosuvastatin 10 mg 1 tablet at breakfast  Losartan 100 mg 1 tablet at breakfast Solifenacin 10 mg 1 tablet at breakfast Tramadol 50 mg  Patient declined the following medications this month: ***  No refill request needed.  {Delivery UJ:6107908   Any concerns about your medications? {yes/no:20286}  How often do you forget or accidentally miss a dose? {Missed doses:25554}  Do you use a pillbox? {yes/no:20286}  Is patient in packaging {yes/no:20286}  If yes  What is the date on your next pill pack?  Any concerns or issues with your packaging?   Recent blood pressure readings are as follows:***  Recent blood glucose readings are as follows:***   Chart review: Recent office visits:  None since last medication coordination call  Recent consult visits:  None since last medication coordination call  Hospital visits:  None in previous 6 months  Medications: Outpatient Encounter Medications as of 11/04/2022  Medication Sig   aspirin 81 MG tablet Take 1 tablet (81 mg total) by mouth daily.   [facility] VITAMIN B12 1000 MCG tablet TOME UNA TABLETA TODOS LOS DIAS   diclofenac sodium (VOLTAREN) 1 % GEL Apply 2 g topically 4 (four) times daily.   glucose blood (BAYER CONTOUR TEST) test strip Check BS QAM fasting   loratadine (CLARITIN) 10 MG tablet Take 10 mg by mouth daily.   losartan (COZAAR) 100 MG tablet TAKE ONE TABLET  BY MOUTH EVERY MORNING.   metoprolol succinate (TOPROL-XL) 25 MG 24 hr tablet TOME UNA TABLETA POR VIA ORAL TODOS LOS DIAS   pantoprazole (PROTONIX) 40 MG tablet Take 1 tablet (40 mg total) by mouth daily. Please translate to Spanish.   rosuvastatin (CRESTOR) 10 MG tablet Take 1 tablet (10 mg total) by mouth daily.   solifenacin (VESICARE) 10 MG tablet TOME UNA TABLETA TODOS LOS DIAS   traMADol (ULTRAM) 50 MG tablet Take 1 tablet (50 mg total) by mouth every 6 (six) hours as needed.   No facility-administered encounter medications on file as of 11/04/2022.   BP Readings from Last 3 Encounters:  05/13/22 124/72  04/19/21 94/60  09/11/20 104/60    Pulse Readings from Last 3 Encounters:  05/13/22 75  04/19/21 68  09/11/20 65    Lab Results  Component Value Date/Time   HGBA1C 6.2 (H) 05/13/2022 12:26 PM   HGBA1C 6.6 (H) 09/27/2021 11:11 AM   Lab Results  Component Value Date   CREATININE 1.89 (H) 05/13/2022   BUN 25 05/13/2022   GFRNONAA 47 (L) 09/11/2020   GFRAA 54 (L) 09/11/2020   NA 141 05/13/2022   K 5.3 05/13/2022   CALCIUM 9.1 05/13/2022   CO2 21 05/13/2022    Future Appointments  Date Time Provider [place]  11/15/2022  2:00 PM [facility]  05/19/2023 10:30 AM Susy Frizzle, MD [facility]   April D Calhoun, [place] Pharmacist Assistant [phone]

## 2022-11-15 ENCOUNTER — Other Ambulatory Visit: Payer: 59

## 2022-11-15 DIAGNOSIS — E538 Deficiency of other specified B group vitamins: Secondary | ICD-10-CM

## 2022-11-15 DIAGNOSIS — E7800 Pure hypercholesterolemia, unspecified: Secondary | ICD-10-CM

## 2022-11-15 DIAGNOSIS — E119 Type 2 diabetes mellitus without complications: Secondary | ICD-10-CM | POA: Diagnosis not present

## 2022-11-15 DIAGNOSIS — I10 Essential (primary) hypertension: Secondary | ICD-10-CM

## 2022-11-15 DIAGNOSIS — D500 Iron deficiency anemia secondary to blood loss (chronic): Secondary | ICD-10-CM

## 2022-11-16 LAB — CBC WITH DIFFERENTIAL/PLATELET
Absolute Monocytes: 383 cells/uL (ref 200–950)
Basophils Absolute: 53 cells/uL (ref 0–200)
Basophils Relative: 0.8 %
Eosinophils Absolute: 172 cells/uL (ref 15–500)
Eosinophils Relative: 2.6 %
HCT: 41.6 % (ref 35.0–45.0)
Hemoglobin: 13.5 g/dL (ref 11.7–15.5)
Lymphs Abs: 1742 cells/uL (ref 850–3900)
MCH: 29 pg (ref 27.0–33.0)
MCHC: 32.5 g/dL (ref 32.0–36.0)
MCV: 89.3 fL (ref 80.0–100.0)
MPV: 11.2 fL (ref 7.5–12.5)
Monocytes Relative: 5.8 %
Neutro Abs: 4250 cells/uL (ref 1500–7800)
Neutrophils Relative %: 64.4 %
Platelets: 217 10*3/uL (ref 140–400)
RBC: 4.66 10*6/uL (ref 3.80–5.10)
RDW: 14.6 % (ref 11.0–15.0)
Total Lymphocyte: 26.4 %
WBC: 6.6 10*3/uL (ref 3.8–10.8)

## 2022-11-16 LAB — LIPID PANEL
Cholesterol: 137 mg/dL (ref <200)
HDL: 42 mg/dL — ABNORMAL LOW (ref >=50)
LDL Cholesterol (Calc): 72 mg/dL (calc)
Non-HDL Cholesterol (Calc): 95 mg/dL (calc) (ref <130)
Total CHOL/HDL Ratio: 3.3 (calc) (ref <5.0)
Triglycerides: 156 mg/dL — ABNORMAL HIGH (ref <150)

## 2022-11-16 LAB — MICROALBUMIN / CREATININE URINE RATIO
Creatinine, Urine: 59 mg/dL (ref 20–275)
Microalb Creat Ratio: 53 mg/g creat — ABNORMAL HIGH (ref <30)
Microalb, Ur: 3.1 mg/dL

## 2022-11-16 LAB — COMPLETE METABOLIC PANEL WITH GFR
AG Ratio: 1.4 (calc) (ref 1.0–2.5)
ALT: 9 U/L (ref 6–29)
AST: 16 U/L (ref 10–35)
Albumin: 3.9 g/dL (ref 3.6–5.1)
Alkaline phosphatase (APISO): 76 U/L (ref 37–153)
BUN/Creatinine Ratio: 15 (calc) (ref 6–22)
BUN: 20 mg/dL (ref 7–25)
CO2: 24 mmol/L (ref 20–32)
Calcium: 9.2 mg/dL (ref 8.6–10.4)
Chloride: 107 mmol/L (ref 98–110)
Creat: 1.33 mg/dL — ABNORMAL HIGH (ref 0.60–0.95)
Globulin: 2.7 g/dL (calc) (ref 1.9–3.7)
Glucose, Bld: 224 mg/dL — ABNORMAL HIGH (ref 65–99)
Potassium: 5.1 mmol/L (ref 3.5–5.3)
Sodium: 141 mmol/L (ref 135–146)
Total Bilirubin: 0.6 mg/dL (ref 0.2–1.2)
Total Protein: 6.6 g/dL (ref 6.1–8.1)
eGFR: 40 mL/min/{1.73_m2} — ABNORMAL LOW (ref >=60)

## 2022-11-16 LAB — HEMOGLOBIN A1C
Hgb A1c MFr Bld: 6.4 % of total Hgb — ABNORMAL HIGH (ref <5.7)
Mean Plasma Glucose: 137 mg/dL
eAG (mmol/L): 7.6 mmol/L

## 2022-11-16 LAB — VITAMIN B12: Vitamin B-12: 1038 pg/mL (ref 200–1100)

## 2022-12-05 ENCOUNTER — Telehealth: Payer: Self-pay | Admitting: Pharmacist

## 2022-12-05 ENCOUNTER — Other Ambulatory Visit: Payer: Self-pay

## 2022-12-05 MED ORDER — TRAMADOL HCL 50 MG PO TABS: 50.0000 mg | ORAL_TABLET | Freq: Four times a day (QID) | ORAL | 4 refills | Status: DC | PRN

## 2022-12-05 NOTE — Progress Notes (Signed)
Care Management & Coordination Services Pharmacy Team  Reason for Encounter: Medication coordination and delivery  Contacted patient to discuss medications and coordinate delivery from [facility]. Spoke with family on 12/05/2022  Cycle dispensing form sent to Erskine Emery, [facility] for review.   Last adherence delivery date: 11/14/2022      Patient is due for next adherence delivery on: 12/16/2022  This delivery to include: Adherence Packaging  30 Days  Vitamin B12 100 mcg 1 tablet at breakfast  Metoprolol ER 25 mg 1 tablet at breakfast  Pantoprazole 40 mg 1 tablet at breakfast  Rosuvastatin 10 mg 1 tablet at breakfast  Losartan 100 mg 1 tablet at breakfast Solifenacin 10 mg 1 tablet at breakfast Tramadol 50 mg  Refills requested from providers include: Tramadol 50 mg  Confirmed delivery date of 12/16/2022, advised patient that pharmacy will contact them the morning of delivery.   Any concerns about your medications? No  How often do you forget or accidentally miss a dose? Never  Do you use a pillbox? No  Is patient in packaging Yes  If yes  Any concerns or issues with your packaging? No   Chart review: Recent office visits:  None  Recent consult visits:  None  Hospital visits:  None in previous 6 months  Medications: Outpatient Encounter Medications as of 12/05/2022  Medication Sig   aspirin 81 MG tablet Take 1 tablet (81 mg total) by mouth daily.   [employer]S VITAMIN B12 1000 MCG tablet TOME UNA TABLETA TODOS LOS DIAS   diclofenac sodium (VOLTAREN) 1 % GEL Apply 2 g topically 4 (four) times daily.   glucose blood (BAYER CONTOUR TEST) test strip Check BS QAM fasting   loratadine (CLARITIN) 10 MG tablet Take 10 mg by mouth daily.   losartan (COZAAR) 100 MG tablet TAKE ONE TABLET BY MOUTH EVERY MORNING.   metoprolol succinate (TOPROL-XL) 25 MG 24 hr tablet TOME UNA TABLETA POR VIA ORAL TODOS LOS DIAS   pantoprazole (PROTONIX) 40 MG tablet Take 1 tablet (40 mg total) by  mouth daily. Please translate to Spanish.   rosuvastatin (CRESTOR) 10 MG tablet Take 1 tablet (10 mg total) by mouth daily.   solifenacin (VESICARE) 10 MG tablet TOME UNA TABLETA TODOS LOS DIAS   traMADol (ULTRAM) 50 MG tablet Take 1 tablet (50 mg total) by mouth every 6 (six) hours as needed.   No facility-administered encounter medications on file as of 12/05/2022.   BP Readings from Last 3 Encounters:  05/13/22 124/72  04/19/21 94/60  09/11/20 104/60    Pulse Readings from Last 3 Encounters:  05/13/22 75  04/19/21 68  09/11/20 65    Lab Results  Component Value Date/Time   HGBA1C 6.4 (H) 11/15/2022 02:04 PM   HGBA1C 6.2 (H) 05/13/2022 12:26 PM   Lab Results  Component Value Date   CREATININE 1.33 (H) 11/15/2022   BUN 20 11/15/2022   GFRNONAA 47 (L) 09/11/2020   GFRAA 54 (L) 09/11/2020   NA 141 11/15/2022   K 5.1 11/15/2022   CALCIUM 9.2 11/15/2022   CO2 24 11/15/2022     Future Appointments  Date Time Provider Department Center  05/19/2023 10:30 AM Donita Brooks, MD [facility]   April D Calhoun, [facility] Clinical Pharmacist Assistant [phone]

## 2023-02-05 ENCOUNTER — Other Ambulatory Visit: Payer: Self-pay | Admitting: Family Medicine

## 2023-02-05 DIAGNOSIS — I10 Essential (primary) hypertension: Secondary | ICD-10-CM

## 2023-03-10 ENCOUNTER — Other Ambulatory Visit: Payer: Self-pay | Admitting: Family Medicine

## 2023-03-10 DIAGNOSIS — I10 Essential (primary) hypertension: Secondary | ICD-10-CM

## 2023-03-11 NOTE — Telephone Encounter (Signed)
Requested Prescriptions  Pending Prescriptions Disp Refills   losartan (COZAAR) 100 MG tablet [Pharmacy Med Name: losartan 100 mg tablet] 30 tablet 2    Sig: TAKE ONE TABLET BY MOUTH EVERY MORNING     Cardiovascular:  Angiotensin Receptor Blockers Failed - 03/10/2023 12:28 PM      Failed - Cr in normal range and within 180 days    Creat  Date Value Ref Range Status  11/15/2022 1.33 (H) 0.60 - 0.95 mg/dL Final   Creatinine, Urine  Date Value Ref Range Status  11/15/2022 59 20 - 275 mg/dL Final         Failed - Valid encounter within last 6 months    Recent Outpatient Visits           1 year ago Essential hypertension   [facility] Donita Brooks, MD   2 years ago Controlled type 2 diabetes mellitus without complication, without long-term current use of insulin (HCC)   [facility] Pickard, Priscille Heidelberg, MD   3 years ago B12 deficiency   [facility] Elmore Guise, FNP   3 years ago Epigastric pain   [facility] Pickard, Priscille Heidelberg, MD   3 years ago Essential hypertension   [facility] Donita Brooks, MD       Future Appointments             In 2 months Pickard, Priscille Heidelberg, MD [facility], [facility]            Passed - K in normal range and within 180 days    Potassium  Date Value Ref Range Status  11/15/2022 5.1 3.5 - 5.3 mmol/L Final         Passed - Patient is not pregnant      Passed - Last BP in normal range    BP Readings from Last 1 Encounters:  05/13/22 124/72

## 2023-04-18 ENCOUNTER — Other Ambulatory Visit: Payer: Self-pay | Admitting: Family Medicine

## 2023-04-18 DIAGNOSIS — I10 Essential (primary) hypertension: Secondary | ICD-10-CM

## 2023-04-18 NOTE — Telephone Encounter (Signed)
Requested medications are due for refill today.  yes  Requested medications are on the active medications list.  yes  Last refill. 12/05/2022 #60 4 rf  Future visit scheduled.   yes  Notes to clinic.  Refill not delegated.    Requested Prescriptions  Pending Prescriptions Disp Refills   traMADol (ULTRAM) 50 MG tablet [Pharmacy Med Name: TRAMADOL 50MG  TABS 50 Tablet] 60 tablet 0    Sig: TAKE 1 TABLET BY MOUTH EVERY 6 HOURS AS NEEDED *REFILL REQUEST*     Not Delegated - Analgesics:  Opioid Agonists Failed - 04/18/2023  7:54 AM      Failed - This refill cannot be delegated      Failed - Urine Drug Screen completed in last 360 days      Failed - Valid encounter within last 3 months    Recent Outpatient Visits           1 year ago Essential hypertension   [facility] Donita Brooks, MD   2 years ago Controlled type 2 diabetes mellitus without complication, without long-term current use of insulin (HCC)   [facility] Pickard, Priscille Heidelberg, MD   3 years ago B12 deficiency   [facility] Elmore Guise, FNP   3 years ago Epigastric pain   [facility] Pickard, Priscille Heidelberg, MD   4 years ago Essential hypertension   [facility] Pickard, Priscille Heidelberg, MD       Future Appointments             In 1 month Pickard, Priscille Heidelberg, MD [place] [facility], [facility]             solifenacin (VESICARE) 10 MG tablet [Pharmacy Med Name: SOLIFENACIN 10MG  TAB 10 Tablet] 30 tablet 10    Sig: TAKE 1 TABLET BY MOUTH EVERY MORNING *REFILL REQUEST*     Urology:  Bladder Agents 2 Failed - 04/18/2023  7:54 AM      Failed - Cr in normal range and within 360 days    Creat  Date Value Ref Range Status  11/15/2022 1.33 (H) 0.60 - 0.95 mg/dL Final   Creatinine, Urine  Date Value Ref Range Status  11/15/2022 59 20 - 275 mg/dL Final         Failed - Valid encounter within last 12 months    Recent Outpatient  Visits           1 year ago Essential hypertension   [facility] Donita Brooks, MD   2 years ago Controlled type 2 diabetes mellitus without complication, without long-term current use of insulin (HCC)   [facility] Pickard, Priscille Heidelberg, MD   3 years ago B12 deficiency   [facility] Elmore Guise, FNP   3 years ago Epigastric pain   [facility] Pickard, Priscille Heidelberg, MD   4 years ago Essential hypertension   [facility] Pickard, Priscille Heidelberg, MD       Future Appointments             In 1 month Pickard, Priscille Heidelberg, MD [place] [facility], [facility]            Passed - ALT in normal range and within 360 days    ALT  Date Value Ref Range Status  11/15/2022 9 6 - 29 U/L Final  Passed - AST in normal range and within 360 days    AST  Date Value Ref Range Status  11/15/2022 16 10 - 35 U/L Final          pantoprazole (PROTONIX) 40 MG tablet [Pharmacy Med Name: PANTOPRAZOLE DR 40MG  TAB 40 Tablet] 30 tablet 10    Sig: TAKE 1 TABLET BY MOUTH ONCE DAILY *REFILL REQUEST*     Gastroenterology: Proton Pump Inhibitors Failed - 04/18/2023  7:54 AM      Failed - Valid encounter within last 12 months    Recent Outpatient Visits           1 year ago Essential hypertension   [facility] Donita Brooks, MD   2 years ago Controlled type 2 diabetes mellitus without complication, without long-term current use of insulin (HCC)   [facility] Medicine Pickard, Priscille Heidelberg, MD   3 years ago B12 deficiency   [facility] Medicine Jenne Pane, Rocky Crafts, FNP   3 years ago Epigastric pain   [facility] Pickard, Priscille Heidelberg, MD   4 years ago Essential hypertension   [facility] Pickard, Priscille Heidelberg, MD       Future Appointments             In 1 month Pickard, Priscille Heidelberg, MD [place] [facility] Medicine, [facility]              metoprolol succinate (TOPROL-XL) 25 MG 24 hr tablet [Pharmacy Med Name: METOPROLOL SUC ER 25MG  TAB 25 Tablet] 30 tablet 10    Sig: TAKE 1 TABLET BY MOUTH EVERY MORNING *REFILL REQUEST*     Cardiovascular:  Beta Blockers Failed - 04/18/2023  7:54 AM      Failed - Valid encounter within last 6 months    Recent Outpatient Visits           1 year ago Essential hypertension   [facility] Medicine Donita Brooks, MD   2 years ago Controlled type 2 diabetes mellitus without complication, without long-term current use of insulin (HCC)   [facility] Medicine Pickard, Priscille Heidelberg, MD   3 years ago B12 deficiency   [facility] Medicine Jenne Pane, Rocky Crafts, FNP   3 years ago Epigastric pain   [facility] Pickard, Priscille Heidelberg, MD   4 years ago Essential hypertension   [facility] Pickard, Priscille Heidelberg, MD       Future Appointments             In 1 month Pickard, Priscille Heidelberg, MD  [facility] Family Medicine, [facility]            Passed - Last BP in normal range    BP Readings from Last 1 Encounters:  05/13/22 124/72         Passed - Last Heart Rate in normal range    Pulse Readings from Last 1 Encounters:  05/13/22 75          rosuvastatin (CRESTOR) 10 MG tablet [Pharmacy Med Name: ROSUVASTATIN 10 MG TAB 10 Tablet] 30 tablet 10    Sig: TAKE 1 TABLET BY MOUTH EACH MORNING *REFILL REQUEST*     Cardiovascular:  Antilipid - Statins 2 Failed - 04/18/2023  7:54 AM      Failed - Cr in normal range and within 360 days    Creat  Date Value Ref Range Status  11/15/2022 1.33 (H)  0.60 - 0.95 mg/dL Final   Creatinine, Urine  Date Value Ref Range Status  11/15/2022 59 20 - 275 mg/dL Final         Failed - Valid encounter within last 12 months    Recent Outpatient Visits           1 year ago Essential hypertension   [facility] Medicine Donita Brooks, MD   2 years ago Controlled type 2 diabetes mellitus without complication,  without long-term current use of insulin (HCC)   [facility] Medicine Pickard, Priscille Heidelberg, MD   3 years ago B12 deficiency   [facility] Medicine Elmore Guise, FNP   3 years ago Epigastric pain   [facility] Medicine Pickard, Priscille Heidelberg, MD   4 years ago Essential hypertension   [facility] Medicine Pickard, Priscille Heidelberg, MD       Future Appointments             In 1 month Pickard, Priscille Heidelberg, MD [facility], [facility]            Failed - Lipid Panel in normal range within the last 12 months    Cholesterol  Date Value Ref Range Status  11/15/2022 137 <200 mg/dL Final   LDL Cholesterol (Calc)  Date Value Ref Range Status  11/15/2022 72 mg/dL (calc) Final    Comment:    Reference range: <100 . Desirable range <100 mg/dL for primary prevention;   <70 mg/dL for patients with CHD or diabetic patients  with > or = 2 CHD risk factors. Marland Kitchen LDL-C is now calculated using the Martin-Hopkins  calculation, which is a validated novel method providing  better accuracy than the Friedewald equation in the  estimation of LDL-C.  Horald Pollen et al. Lenox Ahr. 4098;119(14): 2061-2068  (http://education.[employer].com/faq/FAQ164)    HDL  Date Value Ref Range Status  11/15/2022 42 (L) > OR = 50 mg/dL Final   Triglycerides  Date Value Ref Range Status  11/15/2022 156 (H) <150 mg/dL Final         Passed - Patient is not pregnant

## 2023-04-18 NOTE — Telephone Encounter (Signed)
Prescription Request  04/18/2023  LOV: 05/13/2022  What is the name of the medication or equipment? losartan (COZAAR) 100 MG tablet [[id number]]  Have you contacted your pharmacy to request a refill? Yes   Which pharmacy would you like this sent to?  [facility] Ph. [phone]  Patient notified that their request is being sent to the clinical staff for review and that they should receive a response within 2 business days.   Please advise at Mobile [phone] (mobile)

## 2023-05-04 ENCOUNTER — Other Ambulatory Visit: Payer: Self-pay | Admitting: Family Medicine

## 2023-05-04 DIAGNOSIS — I10 Essential (primary) hypertension: Secondary | ICD-10-CM

## 2023-05-06 NOTE — Telephone Encounter (Signed)
Requested Prescriptions  Pending Prescriptions Disp Refills   losartan (COZAAR) 100 MG tablet [Pharmacy Med Name: LOSARTAN 100MG  TAB 100 Tablet] 90 tablet 0    Sig: TAKE 1 TABLET BY MOUTH EVERY MORNING     Cardiovascular:  Angiotensin Receptor Blockers Failed - 05/04/2023  8:16 PM      Failed - Cr in normal range and within 180 days    Creat  Date Value Ref Range Status  11/15/2022 1.33 (H) 0.60 - 0.95 mg/dL Final   Creatinine, Urine  Date Value Ref Range Status  11/15/2022 59 20 - 275 mg/dL Final         Failed - Valid encounter within last 6 months    Recent Outpatient Visits           2 years ago Essential hypertension   [facility] Donita Brooks, MD   2 years ago Controlled type 2 diabetes mellitus without complication, without long-term current use of insulin (HCC)   [facility] Medicine Pickard, Priscille Heidelberg, MD   3 years ago B12 deficiency   [facility] Medicine Elmore Guise, FNP   3 years ago Epigastric pain   [facility] Pickard, Priscille Heidelberg, MD   4 years ago Essential hypertension   [facility] Medicine Donita Brooks, MD       Future Appointments             In 1 week Tanya Nones, Priscille Heidelberg, MD [facility], [facility]            Passed - K in normal range and within 180 days    Potassium  Date Value Ref Range Status  11/15/2022 5.1 3.5 - 5.3 mmol/L Final         Passed - Patient is not pregnant      Passed - Last BP in normal range    BP Readings from Last 1 Encounters:  05/13/22 124/72

## 2023-05-19 ENCOUNTER — Encounter: Payer: 59 | Admitting: Family Medicine

## 2023-05-21 ENCOUNTER — Encounter: Payer: Self-pay | Admitting: Family Medicine

## 2023-05-21 ENCOUNTER — Ambulatory Visit (INDEPENDENT_AMBULATORY_CARE_PROVIDER_SITE_OTHER): Payer: 59 | Admitting: Family Medicine

## 2023-05-21 VITALS — BP 118/68 | HR 74 | Temp 97.9°F | Ht 62.0 in | Wt 135.4 lb

## 2023-05-21 DIAGNOSIS — I10 Essential (primary) hypertension: Secondary | ICD-10-CM

## 2023-05-21 DIAGNOSIS — K219 Gastro-esophageal reflux disease without esophagitis: Secondary | ICD-10-CM | POA: Diagnosis not present

## 2023-05-21 DIAGNOSIS — E119 Type 2 diabetes mellitus without complications: Secondary | ICD-10-CM | POA: Diagnosis not present

## 2023-05-21 DIAGNOSIS — E7800 Pure hypercholesterolemia, unspecified: Secondary | ICD-10-CM

## 2023-05-21 MED ORDER — METOPROLOL SUCCINATE ER 25 MG PO TB24
ORAL_TABLET | ORAL | 1 refills | Status: DC
Start: 2023-05-21 — End: 2023-10-22

## 2023-05-21 MED ORDER — LOSARTAN POTASSIUM 100 MG PO TABS
ORAL_TABLET | ORAL | 1 refills | Status: DC
Start: 2023-05-21 — End: 2024-01-20

## 2023-05-21 MED ORDER — SOLIFENACIN SUCCINATE 10 MG PO TABS: ORAL_TABLET | ORAL | 3 refills | Status: DC

## 2023-05-21 MED ORDER — ROSUVASTATIN CALCIUM 10 MG PO TABS
10.0000 mg | ORAL_TABLET | Freq: Every day | ORAL | 1 refills | Status: DC
Start: 2023-05-21 — End: 2023-10-22

## 2023-05-21 MED ORDER — PANTOPRAZOLE SODIUM 40 MG PO TBEC
40.0000 mg | DELAYED_RELEASE_TABLET | Freq: Every day | ORAL | 1 refills | Status: DC
Start: 2023-05-21 — End: 2023-10-22

## 2023-05-21 MED ORDER — TRAMADOL HCL 50 MG PO TABS: 50.0000 mg | ORAL_TABLET | Freq: Four times a day (QID) | ORAL | 4 refills | Status: DC | PRN

## 2023-05-21 MED ORDER — LORATADINE 10 MG PO TABS: 10.0000 mg | ORAL_TABLET | Freq: Every day | ORAL | 1 refills | Status: DC

## 2023-05-21 NOTE — Progress Notes (Signed)
Subjective:    Patient ID: Claudia [name], female    DOB: March 10, 1941, 82 y.o.   MRN: [id number]  Patient is here today with her son.  He does the majority of the speaking for her due to language barrier.  Patient states that she is doing well.  Her son denies that she complains of chest pain, shortness of breath, dyspnea on exertion.  They deny any dizziness or lightheadedness.  She denies any arrhythmias.  She denies any falls or memory loss or depression.  She has chronic low back pain but the tramadol seems to be working well for this.  She denies feeling weak.  She is still washing dishes and getting around the house although she uses a walker to ambulate.  She is due for a flu shot but she declines.  Due to her age she does not require colonoscopy or Pap smear. Past Medical History:  Diagnosis Date   Anemia    Arthritis    B12 deficiency 01/11/2020   Blind right eye    Diabetes mellitus without complication (HCC)    GERD (gastroesophageal reflux disease)    chronic gastritis   Hyperlipidemia    Hypertension    Past Surgical History:  Procedure Laterality Date   HERNIA REPAIR     INDUCED ABORTION     Current Outpatient Medications on File Prior to Visit  Medication Sig Dispense Refill   aspirin 81 MG tablet Take 1 tablet (81 mg total) by mouth daily. 30 tablet 11   [facility] VITAMIN B12 1000 MCG tablet TOME UNA TABLETA TODOS LOS DIAS 200 tablet 5   diclofenac sodium (VOLTAREN) 1 % GEL Apply 2 g topically 4 (four) times daily. 100 g 2   glucose blood (BAYER CONTOUR TEST) test strip Check BS QAM fasting 50 each 5   No current facility-administered medications on file prior to visit.   No Known Allergies Social History   Socioeconomic History   Marital status: Single    Spouse name: Not on file   Number of children: 4   Years of education: Not on file   Highest education level: Not on file  Occupational History   Not on file  Tobacco Use   Smoking status: Former    Current  packs/day: 0.00    Types: Cigarettes    Quit date: 70    Years since quitting: 43.8   Smokeless tobacco: Never  Vaping Use   Vaping status: Never Used  Substance and Sexual Activity   Alcohol use: No   Drug use: No   Sexual activity: Not Currently  Other Topics Concern   Not on file  Social History Narrative   Not on file   Social Determinants of Health   Financial Resource Strain: Low Risk  (03/15/2021)   Overall Financial Resource Strain (CARDIA)    Difficulty of Paying Living Expenses: Not very hard  Food Insecurity: Not on file  Transportation Needs: Not on file  Physical Activity: Not on file  Stress: Not on file  Social Connections: Not on file  Intimate Partner Violence: Not on file     Review of Systems  All other systems reviewed and are negative.      Objective:   Physical Exam Vitals reviewed.  Constitutional:      Appearance: She is well-developed.  Eyes:     Conjunctiva/sclera: Conjunctivae normal.     Pupils: Pupils are equal, round, and reactive to light.  Neck:     Thyroid: No thyromegaly.  Vascular: No JVD.  Cardiovascular:     Rate and Rhythm: Normal rate and regular rhythm.     Heart sounds: Normal heart sounds. No murmur heard.    No friction rub. No gallop.  Pulmonary:     Effort: Pulmonary effort is normal. No respiratory distress.     Breath sounds: Normal breath sounds. No wheezing or rales.  Abdominal:     General: Bowel sounds are normal. There is no distension.     Palpations: Abdomen is soft. There is no mass.     Tenderness: There is no abdominal tenderness. There is no guarding or rebound.  Musculoskeletal:     Cervical back: Neck supple.  Lymphadenopathy:     Cervical: No cervical adenopathy.           Assessment & Plan:  Controlled type 2 diabetes mellitus without complication, without long-term current use of insulin (HCC) - Plan: CBC with Differential/Platelet, COMPLETE METABOLIC PANEL WITH GFR, Lipid panel,  Hemoglobin A1c, Protein / Creatinine Ratio, Urine  Benign essential HTN - Plan: losartan (COZAAR) 100 MG tablet, metoprolol succinate (TOPROL-XL) 25 MG 24 hr tablet  Pure hypercholesterolemia - Plan: rosuvastatin (CRESTOR) 10 MG tablet  Gastroesophageal reflux disease, unspecified whether esophagitis present - Plan: pantoprazole (PROTONIX) 40 MG tablet  Her blood pressure looks excellent.  I will make no changes in her antihypertensives.  Continue her on losartan and metoprolol.  I will check a fasting lipid panel.  Ideally I would like her cholesterol/LDL cholesterol to be below 100.  I will also monitor her hemoglobin A1c.  Goal hemoglobin A1c is less than 6.5.  Check a CBC to monitor for anemia.  Continue to use Tylenol and tramadol for hip pain and low back pain.  There is no evidence of any falls or depression or significant memory loss.  Declined her flu shot today.  Recommended a COVID booster.

## 2023-05-22 LAB — PROTEIN / CREATININE RATIO, URINE
Creatinine, Urine: 127 mg/dL (ref 20–275)
Protein/Creat Ratio: 94 mg/g{creat} (ref 24–184)
Protein/Creatinine Ratio: 0.094 mg/mg{creat} (ref 0.024–0.184)
Total Protein, Urine: 12 mg/dL (ref 5–24)

## 2023-05-22 LAB — COMPLETE METABOLIC PANEL WITH GFR
AG Ratio: 1.6 (calc) (ref 1.0–2.5)
ALT: 8 U/L (ref 6–29)
AST: 16 U/L (ref 10–35)
Albumin: 3.9 g/dL (ref 3.6–5.1)
Alkaline phosphatase (APISO): 68 U/L (ref 37–153)
BUN/Creatinine Ratio: 12 (calc) (ref 6–22)
BUN: 20 mg/dL (ref 7–25)
CO2: 23 mmol/L (ref 20–32)
Calcium: 9.6 mg/dL (ref 8.6–10.4)
Chloride: 110 mmol/L (ref 98–110)
Creat: 1.62 mg/dL — ABNORMAL HIGH (ref 0.60–0.95)
Globulin: 2.5 g/dL (ref 1.9–3.7)
Glucose, Bld: 97 mg/dL (ref 65–99)
Potassium: 5.1 mmol/L (ref 3.5–5.3)
Sodium: 142 mmol/L (ref 135–146)
Total Bilirubin: 0.7 mg/dL (ref 0.2–1.2)
Total Protein: 6.4 g/dL (ref 6.1–8.1)
eGFR: 31 mL/min/{1.73_m2} — ABNORMAL LOW (ref >=60)

## 2023-05-22 LAB — HEMOGLOBIN A1C
Hgb A1c MFr Bld: 6.3 %{Hb} — ABNORMAL HIGH (ref <5.7)
Mean Plasma Glucose: 134 mg/dL
eAG (mmol/L): 7.4 mmol/L

## 2023-05-22 LAB — CBC WITH DIFFERENTIAL/PLATELET
Absolute Monocytes: 390 {cells}/uL (ref 200–950)
Basophils Absolute: 52 {cells}/uL (ref 0–200)
Basophils Relative: 0.8 %
Eosinophils Absolute: 59 {cells}/uL (ref 15–500)
Eosinophils Relative: 0.9 %
HCT: 41.2 % (ref 35.0–45.0)
Hemoglobin: 13.1 g/dL (ref 11.7–15.5)
Lymphs Abs: 1489 {cells}/uL (ref 850–3900)
MCH: 29.6 pg (ref 27.0–33.0)
MCHC: 31.8 g/dL — ABNORMAL LOW (ref 32.0–36.0)
MCV: 93 fL (ref 80.0–100.0)
MPV: 11 fL (ref 7.5–12.5)
Monocytes Relative: 6 %
Neutro Abs: 4511 {cells}/uL (ref 1500–7800)
Neutrophils Relative %: 69.4 %
Platelets: 206 10*3/uL (ref 140–400)
RBC: 4.43 10*6/uL (ref 3.80–5.10)
RDW: 14.5 % (ref 11.0–15.0)
Total Lymphocyte: 22.9 %
WBC: 6.5 10*3/uL (ref 3.8–10.8)

## 2023-05-22 LAB — LIPID PANEL
Cholesterol: 129 mg/dL (ref <200)
HDL: 46 mg/dL — ABNORMAL LOW (ref >=50)
LDL Cholesterol (Calc): 65 mg/dL
Non-HDL Cholesterol (Calc): 83 mg/dL (ref <130)
Total CHOL/HDL Ratio: 2.8 (calc) (ref <5.0)
Triglycerides: 97 mg/dL (ref <150)

## 2023-08-21 ENCOUNTER — Ambulatory Visit: Payer: 59 | Admitting: Family Medicine

## 2023-09-22 ENCOUNTER — Other Ambulatory Visit: Payer: Self-pay | Admitting: Family Medicine

## 2023-09-23 NOTE — Telephone Encounter (Signed)
Requested medication (s) are due for refill today - provider review   Requested medication (s) are on the active medication list -yes  Future visit scheduled -no  Last refill: 05/21/23 #60 5RF  Notes to clinic: non delegated Rx  Requested Prescriptions  Pending Prescriptions Disp Refills   traMADol (ULTRAM) 50 MG tablet [Pharmacy Med Name: TRAMADOL 50MG  TABS 50 Tablet] 60 tablet 4    Sig: TOMAR 1 TABLETA POR VIA ORAL CADA 6 HORAS SEGUN SEA NECESARIO     Not Delegated - Analgesics:  Opioid Agonists Failed - 09/23/2023  8:09 AM      Failed - This refill cannot be delegated      Failed - Urine Drug Screen completed in last 360 days      Failed - Valid encounter within last 3 months    Recent Outpatient Visits           2 years ago Essential hypertension   [facility] Donita Brooks, MD   3 years ago Controlled type 2 diabetes mellitus without complication, without long-term current use of insulin (HCC)   [facility] Pickard, Priscille Heidelberg, MD   3 years ago B12 deficiency   [facility] Elmore Guise, FNP   4 years ago Epigastric pain   [facility] Pickard, Priscille Heidelberg, MD   4 years ago Essential hypertension   [facility] Pickard, Priscille Heidelberg, MD                 Requested Prescriptions  Pending Prescriptions Disp Refills   traMADol (ULTRAM) 50 MG tablet [Pharmacy Med Name: TRAMADOL 50MG  TABS 50 Tablet] 60 tablet 4    Sig: TOMAR 1 TABLETA POR VIA ORAL CADA 6 HORAS SEGUN SEA NECESARIO     Not Delegated - Analgesics:  Opioid Agonists Failed - 09/23/2023  8:09 AM      Failed - This refill cannot be delegated      Failed - Urine Drug Screen completed in last 360 days      Failed - Valid encounter within last 3 months    Recent Outpatient Visits           2 years ago Essential hypertension   [facility] Donita Brooks, MD   3 years ago Controlled type 2 diabetes  mellitus without complication, without long-term current use of insulin (HCC)   [facility] Pickard, Priscille Heidelberg, MD   3 years ago B12 deficiency   [facility] Elmore Guise, FNP   4 years ago Epigastric pain   [facility] Pickard, Priscille Heidelberg, MD   4 years ago Essential hypertension   [facility] Pickard, Priscille Heidelberg, MD

## 2023-10-21 ENCOUNTER — Other Ambulatory Visit: Payer: Self-pay | Admitting: Family Medicine

## 2023-10-21 DIAGNOSIS — I10 Essential (primary) hypertension: Secondary | ICD-10-CM

## 2023-10-21 DIAGNOSIS — K219 Gastro-esophageal reflux disease without esophagitis: Secondary | ICD-10-CM

## 2023-10-21 DIAGNOSIS — E7800 Pure hypercholesterolemia, unspecified: Secondary | ICD-10-CM

## 2023-10-22 NOTE — Telephone Encounter (Signed)
 LOV 05/21/2023 with Dr. Tanya Nones.   Labs in date.  Requested Prescriptions  Pending Prescriptions Disp Refills   rosuvastatin (CRESTOR) 10 MG tablet [Pharmacy Med Name: ROSUVASTATIN 10 MG TAB 10 Tablet] 30 tablet 10    Sig: TOMAR 1 TABLETA POR VIA ORAL UNA VEZ AL DIA     Cardiovascular:  Antilipid - Statins 2 Failed - 10/22/2023  1:33 PM      Failed - Cr in normal range and within 360 days    Creat  Date Value Ref Range Status  05/21/2023 1.62 (H) 0.60 - 0.95 mg/dL Final   Creatinine, Urine  Date Value Ref Range Status  05/21/2023 127 20 - 275 mg/dL Final         Failed - Valid encounter within last 12 months    Recent Outpatient Visits           2 years ago Essential hypertension   [facility] Donita Brooks, MD   3 years ago Controlled type 2 diabetes mellitus without complication, without long-term current use of insulin (HCC)   [facility] Pickard, Priscille Heidelberg, MD   3 years ago B12 deficiency   [facility] Elmore Guise, FNP   4 years ago Epigastric pain   [facility] Pickard, Priscille Heidelberg, MD   4 years ago Essential hypertension   [facility] Tanya Nones, Priscille Heidelberg, MD              Failed - Lipid Panel in normal range within the last 12 months    Cholesterol  Date Value Ref Range Status  05/21/2023 129 <200 mg/dL Final   LDL Cholesterol (Calc)  Date Value Ref Range Status  05/21/2023 65 mg/dL (calc) Final    Comment:    Reference range: <100 . Desirable range <100 mg/dL for primary prevention;   <70 mg/dL for patients with CHD or diabetic patients  with > or = 2 CHD risk factors. Marland Kitchen LDL-C is now calculated using the Martin-Hopkins  calculation, which is a validated novel method providing  better accuracy than the Friedewald equation in the  estimation of LDL-C.  Horald Pollen et al. Lenox Ahr. 4098;119(14): 2061-2068  (http://education.[employer].com/faq/FAQ164)    HDL  Date  Value Ref Range Status  05/21/2023 46 (L) > OR = 50 mg/dL Final   Triglycerides  Date Value Ref Range Status  05/21/2023 97 <150 mg/dL Final         Passed - Patient is not pregnant       metoprolol succinate (TOPROL-XL) 25 MG 24 hr tablet [Pharmacy Med Name: METOPROLOL SUC ER 25MG  TAB 25 Tablet] 30 tablet 10    Sig: TOME UNA TABLETA POR VIA ORAL TODOS LOS DIAS     Cardiovascular:  Beta Blockers Failed - 10/22/2023  1:33 PM      Failed - Valid encounter within last 6 months    Recent Outpatient Visits           2 years ago Essential hypertension   [facility] Donita Brooks, MD   3 years ago Controlled type 2 diabetes mellitus without complication, without long-term current use of insulin (HCC)   [facility] Pickard, Priscille Heidelberg, MD   3 years ago B12 deficiency   [facility] Elmore Guise, FNP   4 years ago Epigastric pain   [facility] Tanya Nones, Priscille Heidelberg, MD   4 years ago Essential hypertension  [facility] Pickard, Priscille Heidelberg, MD              Passed - Last BP in normal range    BP Readings from Last 1 Encounters:  05/21/23 118/68         Passed - Last Heart Rate in normal range    Pulse Readings from Last 1 Encounters:  05/21/23 74          pantoprazole (PROTONIX) 40 MG tablet [Pharmacy Med Name: PANTOPRAZOLE DR 40MG  TAB 40 Tablet] 30 tablet 10    Sig: TOMAR UNA (1) TABLETA POR VIA ORAL UNA VEZ AL DIA     Gastroenterology: Proton Pump Inhibitors Failed - 10/22/2023  1:33 PM      Failed - Valid encounter within last 12 months    Recent Outpatient Visits           2 years ago Essential hypertension   [facility] Donita Brooks, MD   3 years ago Controlled type 2 diabetes mellitus without complication, without long-term current use of insulin (HCC)   [facility] Pickard, Priscille Heidelberg, MD   3 years ago B12 deficiency   [facility]  Medicine Elmore Guise, FNP   4 years ago Epigastric pain   [facility] Pickard, Priscille Heidelberg, MD   4 years ago Essential hypertension   [facility] Pickard, Priscille Heidelberg, MD

## 2023-10-30 DIAGNOSIS — H0102B Squamous blepharitis left eye, upper and lower eyelids: Secondary | ICD-10-CM | POA: Diagnosis not present

## 2023-10-30 DIAGNOSIS — H1811 Bullous keratopathy, right eye: Secondary | ICD-10-CM | POA: Diagnosis not present

## 2023-10-30 DIAGNOSIS — I129 Hypertensive chronic kidney disease with stage 1 through stage 4 chronic kidney disease, or unspecified chronic kidney disease: Secondary | ICD-10-CM | POA: Diagnosis not present

## 2023-10-30 DIAGNOSIS — Z79899 Other long term (current) drug therapy: Secondary | ICD-10-CM | POA: Diagnosis not present

## 2023-10-30 DIAGNOSIS — N1832 Chronic kidney disease, stage 3b: Secondary | ICD-10-CM | POA: Diagnosis not present

## 2023-10-30 DIAGNOSIS — Z947 Corneal transplant status: Secondary | ICD-10-CM | POA: Diagnosis not present

## 2023-10-30 DIAGNOSIS — H5711 Ocular pain, right eye: Secondary | ICD-10-CM | POA: Diagnosis not present

## 2023-10-30 DIAGNOSIS — H2512 Age-related nuclear cataract, left eye: Secondary | ICD-10-CM | POA: Diagnosis not present

## 2023-10-30 DIAGNOSIS — E785 Hyperlipidemia, unspecified: Secondary | ICD-10-CM | POA: Diagnosis not present

## 2023-10-30 DIAGNOSIS — H169 Unspecified keratitis: Secondary | ICD-10-CM | POA: Diagnosis not present

## 2023-10-30 DIAGNOSIS — H0102A Squamous blepharitis right eye, upper and lower eyelids: Secondary | ICD-10-CM | POA: Diagnosis not present

## 2023-10-30 DIAGNOSIS — E1122 Type 2 diabetes mellitus with diabetic chronic kidney disease: Secondary | ICD-10-CM | POA: Diagnosis not present

## 2023-10-30 DIAGNOSIS — S0500XA Injury of conjunctiva and corneal abrasion without foreign body, unspecified eye, initial encounter: Secondary | ICD-10-CM | POA: Diagnosis not present

## 2023-10-30 DIAGNOSIS — H16001 Unspecified corneal ulcer, right eye: Secondary | ICD-10-CM | POA: Diagnosis not present

## 2023-10-30 DIAGNOSIS — H04123 Dry eye syndrome of bilateral lacrimal glands: Secondary | ICD-10-CM | POA: Diagnosis not present

## 2023-10-31 ENCOUNTER — Ambulatory Visit: Admitting: Family Medicine

## 2023-11-04 ENCOUNTER — Telehealth: Payer: Self-pay

## 2023-11-04 NOTE — Transitions of Care (Post Inpatient/ED Visit) (Signed)
 11/04/2023  Name: Claudia [name] MRN: [id number] DOB: October 22, 1940  Today's TOC FU Call Status: Today's TOC FU Call Status:: Successful TOC FU Call Completed TOC FU Call Complete Date: 11/04/23 Patient's Name and Date of Birth confirmed.  Transition Care Management Follow-up Telephone Call Date of Discharge: 11/03/23 Discharge Facility: Other ([facility]) Name of Other ([facility]) Discharge Facility: [facility] Type of Discharge: Inpatient Admission Primary Inpatient Discharge Diagnosis:: corneal ulcer How have you been since you were released from the hospital?: Better Any questions or concerns?: Yes Patient Questions/Concerns:: daughter concerned -  dementia, hallucinations, and unsteady gait  Items Reviewed: Did you receive and understand the discharge instructions provided?: Yes Medications obtained,verified, and reconciled?: Yes (Medications Reviewed) Any new allergies since your discharge?: No Dietary orders reviewed?: Yes Do you have support at home?: Yes People in Home: child(ren), adult  Medications Reviewed Today: Medications Reviewed Today     Reviewed by Karena Addison, LPN (Licensed Practical Nurse) on 11/04/23 at [phone]  Med List Status: <None>   Medication Order Taking? Sig Documenting Provider Last Dose Status Informant  aspirin 81 MG tablet [id number]  Take 1 tablet (81 mg total) by mouth daily. Dorena Bodo, PA-C  Active   atropine 1 % ophthalmic solution [id number] Yes Place 1 drop into the right eye 4 (four) times daily. [provider]  Active   [facility] VITAMIN B12 1000 MCG tablet [id number]  TOME UNA TABLETA TODOS LOS DIAS Donita Brooks, MD  Active   diclofenac sodium (VOLTAREN) 1 % GEL [id number]  Apply 2 g topically 4 (four) times daily. Donita Brooks, MD  Active   erythromycin ophthalmic ointment [id number] Yes Place 1 Application into the right eye once. [provider]  Active   glucose blood (BAYER CONTOUR TEST) test strip [id number]   Check BS QAM fasting Donita Brooks, MD  Active   loratadine (CLARITIN) 10 MG tablet [id number]  Take 1 tablet (10 mg total) by mouth daily. Donita Brooks, MD  Active   losartan (COZAAR) 100 MG tablet [id number]  TAKE 1 TABLET BY MOUTH EVERY MORNING Donita Brooks, MD  Active   metoprolol succinate (TOPROL-XL) 25 MG 24 hr tablet [id number]  TOME UNA TABLETA POR VIA ORAL TODOS LOS DIAS Donita Brooks, MD  Active   moxifloxacin (VIGAMOX) 0.5 % ophthalmic solution [id number] Yes Place 1 drop into the right eye 3 (three) times daily. [provider]  Active   pantoprazole (PROTONIX) 40 MG tablet [id number]  TOMAR UNA (1) TABLETA POR VIA ORAL UNA VEZ AL DIA Donita Brooks, MD  Active   rosuvastatin (CRESTOR) 10 MG tablet [id number]  TOMAR 1 TABLETA POR VIA ORAL UNA VEZ AL DIA Donita Brooks, MD  Active   solifenacin (VESICARE) 10 MG tablet [id number]  TOME UNA TABLETA TODOS LOS DIAS Donita Brooks, MD  Active   traMADol (ULTRAM) 50 MG tablet [id number]  Take 1 tablet (50 mg total) by mouth every 12 (twelve) hours as needed. Donita Brooks, MD  Active             Home Care and Equipment/Supplies: Were Home Health Services Ordered?: NA Any new equipment or medical supplies ordered?: NA  Functional Questionnaire: Do you need assistance with bathing/showering or dressing?: Yes Do you need assistance with meal preparation?: Yes Do you need assistance with eating?: No Do you have difficulty maintaining continence: No Do you need assistance with getting out of bed/getting out of a chair/moving?: No Do you have  difficulty managing or taking your medications?: Yes  Follow up appointments reviewed: PCP Follow-up appointment confirmed?: Yes Date of PCP follow-up appointment?: 11/10/23 Follow-up Provider: [facility] Follow-up appointment confirmed?: Yes Date of Specialist follow-up appointment?: 11/07/23 Follow-Up Specialty Provider:: cornea specialist Do  you need transportation to your follow-up appointment?: No Do you understand care options if your condition(s) worsen?: Yes-patient verbalized understanding    SIGNATURE Karena Addison, LPN [facility] Nurse Health Advisor Direct Dial [phone]

## 2023-11-07 DIAGNOSIS — H16001 Unspecified corneal ulcer, right eye: Secondary | ICD-10-CM | POA: Diagnosis not present

## 2023-11-10 ENCOUNTER — Ambulatory Visit (INDEPENDENT_AMBULATORY_CARE_PROVIDER_SITE_OTHER): Admitting: Family Medicine

## 2023-11-10 ENCOUNTER — Encounter: Payer: Self-pay | Admitting: Family Medicine

## 2023-11-10 VITALS — BP 122/62 | HR 51 | Temp 98.7°F | Ht 62.0 in | Wt 126.0 lb

## 2023-11-10 DIAGNOSIS — F03B11 Unspecified dementia, moderate, with agitation: Secondary | ICD-10-CM

## 2023-11-10 DIAGNOSIS — R634 Abnormal weight loss: Secondary | ICD-10-CM

## 2023-11-10 MED ORDER — MIRTAZAPINE 30 MG PO TABS: 30.0000 mg | ORAL_TABLET | Freq: Every day | ORAL | 6 refills | Status: AC

## 2023-11-10 MED ORDER — MIRTAZAPINE 30 MG PO TABS: 30.0000 mg | ORAL_TABLET | Freq: Every day | ORAL | 6 refills | Status: DC

## 2023-11-10 NOTE — Progress Notes (Signed)
 Wt Readings from Last 3 Encounters:  11/10/23 126 lb (57.2 kg)  05/21/23 135 lb 6.4 oz (61.4 kg)  05/13/22 149 lb (67.6 kg)     Subjective:    Patient ID: Claudia [name], female    DOB: 1941-01-10, 84 y.o.   MRN: [id number]  Patient is here today with her daughter.  Daughter states that over the last year or so, her memory has steadily worsened.  She has stopped eating.  She is losing weight.  She is not sleeping.  She is becoming increasingly disorganized at night and confused.  She needs more supervision due to disorientation.  Patient was staying with her son but her son has a job and so now the patient is staying with her daughter.  She has lost weight since I last saw her but her daughter attributes this to poor appetite.  Today her blood pressure and heart rate are relatively low.  Daughter denies any syncope or near syncope.  Patient is unable to participate in the conversation however she denies any pain in her abdomen.  She denies any chest pain.  She denies any trouble breathing.  She is able to follow basic commands Past Medical History:  Diagnosis Date   Anemia    Arthritis    B12 deficiency 01/11/2020   Blind right eye    Diabetes mellitus without complication (HCC)    GERD (gastroesophageal reflux disease)    chronic gastritis   Hyperlipidemia    Hypertension    Past Surgical History:  Procedure Laterality Date   HERNIA REPAIR     INDUCED ABORTION     Current Outpatient Medications on File Prior to Visit  Medication Sig Dispense Refill   aspirin 81 MG tablet Take 1 tablet (81 mg total) by mouth daily. 30 tablet 11   atropine 1 % ophthalmic solution Place 1 drop into the right eye 4 (four) times daily.     chlorhexidine (PERIDEX) 0.12 % solution Use 15 mL in the mouth or throat 2 (two) times a day.     CVS VITAMIN B12 1000 MCG tablet TOME UNA TABLETA TODOS LOS DIAS 200 tablet 5   diclofenac sodium (VOLTAREN) 1 % GEL Apply 2 g topically 4 (four) times daily. 100 g 2    doxycycline (VIBRA-TABS) 100 MG tablet Take 100 mg by mouth 2 (two) times daily.     erythromycin ophthalmic ointment Place 1 Application into the right eye once.     ibuprofen (ADVIL) 800 MG tablet Take by mouth.     loratadine (CLARITIN) 10 MG tablet Take 1 tablet (10 mg total) by mouth daily. 90 tablet 1   losartan (COZAAR) 100 MG tablet TAKE 1 TABLET BY MOUTH EVERY MORNING 90 tablet 1   metoprolol succinate (TOPROL-XL) 25 MG 24 hr tablet TOME UNA TABLETA POR VIA ORAL TODOS LOS DIAS 30 tablet 2   moxifloxacin (VIGAMOX) 0.5 % ophthalmic solution Place 1 drop into the right eye 3 (three) times daily.     pantoprazole (PROTONIX) 40 MG tablet TOMAR UNA (1) TABLETA POR VIA ORAL UNA VEZ AL DIA 30 tablet 2   rosuvastatin (CRESTOR) 10 MG tablet TOMAR 1 TABLETA POR VIA ORAL UNA VEZ AL DIA 30 tablet 2   solifenacin (VESICARE) 10 MG tablet TOME UNA TABLETA TODOS LOS DIAS 90 tablet 3   glucose blood (BAYER CONTOUR TEST) test strip Check BS QAM fasting (Patient not taking: Reported on 11/10/2023) 50 each 5   traMADol (ULTRAM) 50 MG tablet Take 1  tablet (50 mg total) by mouth every 12 (twelve) hours as needed. (Patient not taking: Reported on 11/10/2023) 60 tablet 4   No current facility-administered medications on file prior to visit.   No Known Allergies Social History   Socioeconomic History   Marital status: Single    Spouse name: Not on file   Number of children: 4   Years of education: Not on file   Highest education level: Not on file  Occupational History   Not on file  Tobacco Use   Smoking status: Former    Current packs/day: 0.00    Types: Cigarettes    Quit date: 79    Years since quitting: 44.2   Smokeless tobacco: Never  Vaping Use   Vaping status: Never Used  Substance and Sexual Activity   Alcohol use: No   Drug use: No   Sexual activity: Not Currently  Other Topics Concern   Not on file  Social History Narrative   Not on file   Social Drivers of Health   Financial  Resource Strain: Low Risk  (03/15/2021)   Overall Financial Resource Strain (CARDIA)    Difficulty of Paying Living Expenses: Not very hard  Food Insecurity: Not on file  Transportation Needs: Not on file  Physical Activity: Not on file  Stress: Not on file  Social Connections: Not on file  Intimate Partner Violence: Not on file     Review of Systems  All other systems reviewed and are negative.      Objective:   Physical Exam Vitals reviewed.  Constitutional:      Appearance: She is well-developed.  Eyes:     Conjunctiva/sclera: Conjunctivae normal.     Pupils: Pupils are equal, round, and reactive to light.  Neck:     Thyroid: No thyromegaly.     Vascular: No JVD.  Cardiovascular:     Rate and Rhythm: Normal rate and regular rhythm.     Heart sounds: Normal heart sounds. No murmur heard.    No friction rub. No gallop.  Pulmonary:     Effort: Pulmonary effort is normal. No respiratory distress.     Breath sounds: Normal breath sounds. No wheezing or rales.  Abdominal:     General: Bowel sounds are normal. There is no distension.     Palpations: Abdomen is soft. There is no mass.     Tenderness: There is no abdominal tenderness. There is no guarding or rebound.  Musculoskeletal:     Cervical back: Neck supple.  Lymphadenopathy:     Cervical: No cervical adenopathy.           Assessment & Plan:  Weight loss - Plan: CBC with Differential/Platelet, COMPLETE METABOLIC PANEL WITHOUT GFR, TSH  Moderate dementia with agitation, unspecified dementia type (HCC) First, I want to minimize any medication that the patient is taking.  Given her low heart rate and low blood pressure I want her to discontinue metoprolol.  Given the increasing confusion and memory loss, I wanted to discontinue Vesicare and avoid any anticholinergic drugs.  Meanwhile I will start Remeron 30 mg p.o. nightly to help with patient hopefully sleep better at night and also to help improve her appetite.   Reassess in 4 weeks.  Meanwhile check CBC, CMP, and TSH

## 2023-11-11 ENCOUNTER — Inpatient Hospital Stay (HOSPITAL_COMMUNITY)
Admission: EM | Admit: 2023-11-11 | Discharge: 2023-11-16 | DRG: 438 | Disposition: A | Discharge status: Home Health | Attending: Internal Medicine | Admitting: Internal Medicine

## 2023-11-11 ENCOUNTER — Emergency Department (HOSPITAL_COMMUNITY)

## 2023-11-11 ENCOUNTER — Other Ambulatory Visit: Payer: Self-pay

## 2023-11-11 ENCOUNTER — Encounter (HOSPITAL_COMMUNITY): Payer: Self-pay

## 2023-11-11 ENCOUNTER — Ambulatory Visit: Payer: Self-pay

## 2023-11-11 DIAGNOSIS — E43 Unspecified severe protein-calorie malnutrition: Secondary | ICD-10-CM | POA: Diagnosis not present

## 2023-11-11 DIAGNOSIS — I700 Atherosclerosis of aorta: Secondary | ICD-10-CM | POA: Diagnosis not present

## 2023-11-11 DIAGNOSIS — I9589 Other hypotension: Secondary | ICD-10-CM | POA: Diagnosis present

## 2023-11-11 DIAGNOSIS — K859 Acute pancreatitis without necrosis or infection, unspecified: Principal | ICD-10-CM | POA: Diagnosis present

## 2023-11-11 DIAGNOSIS — Z7982 Long term (current) use of aspirin: Secondary | ICD-10-CM | POA: Diagnosis not present

## 2023-11-11 DIAGNOSIS — R54 Age-related physical debility: Secondary | ICD-10-CM | POA: Diagnosis not present

## 2023-11-11 DIAGNOSIS — H16001 Unspecified corneal ulcer, right eye: Secondary | ICD-10-CM | POA: Diagnosis not present

## 2023-11-11 DIAGNOSIS — Z87891 Personal history of nicotine dependence: Secondary | ICD-10-CM | POA: Diagnosis not present

## 2023-11-11 DIAGNOSIS — Z79899 Other long term (current) drug therapy: Secondary | ICD-10-CM | POA: Diagnosis not present

## 2023-11-11 DIAGNOSIS — K219 Gastro-esophageal reflux disease without esophagitis: Secondary | ICD-10-CM | POA: Diagnosis not present

## 2023-11-11 DIAGNOSIS — Z1152 Encounter for screening for COVID-19: Secondary | ICD-10-CM | POA: Diagnosis not present

## 2023-11-11 DIAGNOSIS — D72829 Elevated white blood cell count, unspecified: Secondary | ICD-10-CM | POA: Insufficient documentation

## 2023-11-11 DIAGNOSIS — E785 Hyperlipidemia, unspecified: Secondary | ICD-10-CM | POA: Diagnosis not present

## 2023-11-11 DIAGNOSIS — E876 Hypokalemia: Secondary | ICD-10-CM | POA: Diagnosis not present

## 2023-11-11 DIAGNOSIS — E872 Acidosis, unspecified: Secondary | ICD-10-CM | POA: Diagnosis not present

## 2023-11-11 DIAGNOSIS — Z0389 Encounter for observation for other suspected diseases and conditions ruled out: Secondary | ICD-10-CM | POA: Diagnosis not present

## 2023-11-11 DIAGNOSIS — R42 Dizziness and giddiness: Secondary | ICD-10-CM | POA: Diagnosis not present

## 2023-11-11 DIAGNOSIS — H5461 Unqualified visual loss, right eye, normal vision left eye: Secondary | ICD-10-CM | POA: Diagnosis not present

## 2023-11-11 DIAGNOSIS — R933 Abnormal findings on diagnostic imaging of other parts of digestive tract: Secondary | ICD-10-CM | POA: Diagnosis not present

## 2023-11-11 DIAGNOSIS — Z6823 Body mass index (BMI) 23.0-23.9, adult: Secondary | ICD-10-CM | POA: Diagnosis not present

## 2023-11-11 DIAGNOSIS — Z833 Family history of diabetes mellitus: Secondary | ICD-10-CM | POA: Diagnosis not present

## 2023-11-11 DIAGNOSIS — I10 Essential (primary) hypertension: Secondary | ICD-10-CM | POA: Diagnosis present

## 2023-11-11 DIAGNOSIS — R630 Anorexia: Secondary | ICD-10-CM | POA: Diagnosis not present

## 2023-11-11 DIAGNOSIS — E861 Hypovolemia: Secondary | ICD-10-CM | POA: Insufficient documentation

## 2023-11-11 DIAGNOSIS — R1011 Right upper quadrant pain: Principal | ICD-10-CM

## 2023-11-11 DIAGNOSIS — R109 Unspecified abdominal pain: Secondary | ICD-10-CM | POA: Diagnosis not present

## 2023-11-11 DIAGNOSIS — R55 Syncope and collapse: Secondary | ICD-10-CM | POA: Diagnosis not present

## 2023-11-11 DIAGNOSIS — F0390 Unspecified dementia without behavioral disturbance: Secondary | ICD-10-CM | POA: Diagnosis present

## 2023-11-11 LAB — CBC WITH DIFFERENTIAL/PLATELET
Abs Immature Granulocytes: 0.16 10*3/uL — ABNORMAL HIGH (ref 0.00–0.07)
Absolute Lymphocytes: 1420 {cells}/uL (ref 850–3900)
Absolute Monocytes: 420 {cells}/uL (ref 200–950)
Basophils Absolute: 50 {cells}/uL (ref 0–200)
Basophils Absolute: 0 10*3/uL (ref 0.0–0.1)
Basophils Relative: 0.6 %
Basophils Relative: 0 %
Eosinophils Absolute: 67 {cells}/uL (ref 15–500)
Eosinophils Absolute: 0 10*3/uL (ref 0.0–0.5)
Eosinophils Relative: 0.8 %
Eosinophils Relative: 0 %
HCT: 37.5 % (ref 35.0–45.0)
HCT: 42.6 % (ref 36.0–46.0)
Hemoglobin: 12 g/dL (ref 11.7–15.5)
Hemoglobin: 13.8 g/dL (ref 12.0–15.0)
Immature Granulocytes: 1 %
Lymphocytes Relative: 3 %
Lymphs Abs: 0.6 10*3/uL — ABNORMAL LOW (ref 0.7–4.0)
MCH: 29.9 pg (ref 27.0–33.0)
MCH: 30.3 pg (ref 26.0–34.0)
MCHC: 32 g/dL (ref 32.0–36.0)
MCHC: 32.4 g/dL (ref 30.0–36.0)
MCV: 93.3 fL (ref 80.0–100.0)
MCV: 93.6 fL (ref 80.0–100.0)
MPV: 11.5 fL (ref 7.5–12.5)
Monocytes Absolute: 0.8 10*3/uL (ref 0.1–1.0)
Monocytes Relative: 5 %
Monocytes Relative: 4 %
Neutro Abs: 6443 {cells}/uL (ref 1500–7800)
Neutro Abs: 17.7 10*3/uL — ABNORMAL HIGH (ref 1.7–7.7)
Neutrophils Relative %: 76.7 %
Neutrophils Relative %: 92 %
Platelets: 180 10*3/uL (ref 140–400)
Platelets: 180 10*3/uL (ref 150–400)
RBC: 4.02 10*6/uL (ref 3.80–5.10)
RBC: 4.55 MIL/uL (ref 3.87–5.11)
RDW: 14.2 % (ref 11.0–15.0)
RDW: 16.1 % — ABNORMAL HIGH (ref 11.5–15.5)
Total Lymphocyte: 16.9 %
WBC: 8.4 10*3/uL (ref 3.8–10.8)
WBC: 19.2 10*3/uL — ABNORMAL HIGH (ref 4.0–10.5)
nRBC: 0 % (ref 0.0–0.2)

## 2023-11-11 LAB — APTT: aPTT: 29 s (ref 24–36)

## 2023-11-11 LAB — COMPREHENSIVE METABOLIC PANEL WITH GFR
ALT: 13 U/L (ref 0–44)
AST: 30 U/L (ref 15–41)
Albumin: 2.8 g/dL — ABNORMAL LOW (ref 3.5–5.0)
Alkaline Phosphatase: 100 U/L (ref 38–126)
Anion gap: 13 (ref 5–15)
BUN: 28 mg/dL — ABNORMAL HIGH (ref 8–23)
CO2: 16 mmol/L — ABNORMAL LOW (ref 22–32)
Calcium: 8.6 mg/dL — ABNORMAL LOW (ref 8.9–10.3)
Chloride: 108 mmol/L (ref 98–111)
Creatinine, Ser: 1.45 mg/dL — ABNORMAL HIGH (ref 0.44–1.00)
GFR, Estimated: 36 mL/min — ABNORMAL LOW (ref >60)
Glucose, Bld: 123 mg/dL — ABNORMAL HIGH (ref 70–99)
Potassium: 4.1 mmol/L (ref 3.5–5.1)
Sodium: 137 mmol/L (ref 135–145)
Total Bilirubin: 1.6 mg/dL — ABNORMAL HIGH (ref 0.0–1.2)
Total Protein: 5.9 g/dL — ABNORMAL LOW (ref 6.5–8.1)

## 2023-11-11 LAB — I-STAT CG4 LACTIC ACID, ED: Lactic Acid, Venous: 4.5 mmol/L (ref 0.5–1.9)

## 2023-11-11 LAB — COMPLETE METABOLIC PANEL WITHOUT GFR
AG Ratio: 1.5 (calc) (ref 1.0–2.5)
ALT: 7 U/L (ref 6–29)
AST: 14 U/L (ref 10–35)
Albumin: 3.2 g/dL — ABNORMAL LOW (ref 3.6–5.1)
Alkaline phosphatase (APISO): 109 U/L (ref 37–153)
BUN/Creatinine Ratio: 27 (calc) — ABNORMAL HIGH (ref 6–22)
BUN: 37 mg/dL — ABNORMAL HIGH (ref 7–25)
CO2: 21 mmol/L (ref 20–32)
Calcium: 8.5 mg/dL — ABNORMAL LOW (ref 8.6–10.4)
Chloride: 111 mmol/L — ABNORMAL HIGH (ref 98–110)
Creat: 1.35 mg/dL — ABNORMAL HIGH (ref 0.60–0.95)
Globulin: 2.2 g/dL (ref 1.9–3.7)
Glucose, Bld: 116 mg/dL — ABNORMAL HIGH (ref 65–99)
Potassium: 4.9 mmol/L (ref 3.5–5.3)
Sodium: 140 mmol/L (ref 135–146)
Total Bilirubin: 0.7 mg/dL (ref 0.2–1.2)
Total Protein: 5.4 g/dL — ABNORMAL LOW (ref 6.1–8.1)

## 2023-11-11 LAB — I-STAT CHEM 8, ED
BUN: 34 mg/dL — ABNORMAL HIGH (ref 8–23)
Calcium, Ion: 1.17 mmol/L (ref 1.15–1.40)
Chloride: 107 mmol/L (ref 98–111)
Creatinine, Ser: 1.4 mg/dL — ABNORMAL HIGH (ref 0.44–1.00)
Glucose, Bld: 103 mg/dL — ABNORMAL HIGH (ref 70–99)
HCT: 43 % (ref 36.0–46.0)
Hemoglobin: 14.6 g/dL (ref 12.0–15.0)
Potassium: 4.2 mmol/L (ref 3.5–5.1)
Sodium: 139 mmol/L (ref 135–145)
TCO2: 20 mmol/L — ABNORMAL LOW (ref 22–32)

## 2023-11-11 LAB — PROTIME-INR
INR: 1.2 (ref 0.8–1.2)
Prothrombin Time: 15.8 s — ABNORMAL HIGH (ref 11.4–15.2)

## 2023-11-11 LAB — TSH: TSH: 1.29 m[IU]/L (ref 0.40–4.50)

## 2023-11-11 LAB — LIPASE, BLOOD: Lipase: 407 U/L — ABNORMAL HIGH (ref 11–51)

## 2023-11-11 MED ORDER — METRONIDAZOLE 500 MG/100ML IV SOLN
500.0000 mg | Freq: Once | INTRAVENOUS | Status: AC
Start: 2023-11-11 — End: 2023-11-12
  Administered 2023-11-11: 500 mg via INTRAVENOUS
  Filled 2023-11-11: qty 100

## 2023-11-11 MED ORDER — SODIUM CHLORIDE 0.9 % IV SOLN
2.0000 g | Freq: Once | INTRAVENOUS | Status: AC
  Administered 2023-11-11: 2 g via INTRAVENOUS
  Filled 2023-11-11: qty 12.5

## 2023-11-11 MED ORDER — LACTATED RINGERS IV SOLN: INTRAVENOUS | Status: AC

## 2023-11-11 MED ORDER — LACTATED RINGERS IV BOLUS (SEPSIS)
1000.0000 mL | Freq: Once | INTRAVENOUS | Status: AC
  Administered 2023-11-11: 1000 mL via INTRAVENOUS

## 2023-11-11 MED ORDER — IOHEXOL 350 MG/ML SOLN
75.0000 mL | Freq: Once | INTRAVENOUS | Status: AC | PRN
  Administered 2023-11-11: 75 mL via INTRAVENOUS

## 2023-11-11 NOTE — ED Triage Notes (Signed)
 Pt brought in by her daughter, reports she has not had an appetite today, has not been eating. She says she has felt dizzy today and had a near syncopal episode. They called ems and reports her BP was low. They refused transport because they did not want to go to [facility].

## 2023-11-11 NOTE — ED Notes (Signed)
 Attempted to obtain blood cultures. Unsuccessfulx2 attempts. Pt unable to void on bedpan. Refuses straight cath. Educated pt on importance of obtaining a sample but she refuses. Pt agitated. MD aware

## 2023-11-11 NOTE — ED Notes (Signed)
 Phlebotomy attempt blood cultures. Unable to obtain

## 2023-11-11 NOTE — ED Provider Notes (Signed)
 [place] EMERGENCY DEPARTMENT AT [facility] Provider Note   CSN: [id number] Arrival date & time: 11/11/23  2038     History  No chief complaint on file.   Claudia [name] is a 83 y.o. female.  83 yo F with a chief complaints of not feeling well and not eating and drinking.  Becoming more fatigued over the past couple days.  She is also having some upper abdominal pain worse on the right.  No fevers no cough or congestion no obvious urinary symptoms.  She collapsed while trying to get up while going to the bathroom and EMS was called.  Found to have a low blood pressure and was told she had to come to the hospital to get some IV fluids.        Home Medications Prior to Admission medications   Medication Sig Start Date End Date Taking? Authorizing Provider  aspirin 81 MG tablet Take 1 tablet (81 mg total) by mouth daily. 08/03/14   Allayne Butcher B, PA-C  atropine 1 % ophthalmic solution Place 1 drop into the right eye 4 (four) times daily. 11/03/23   [provider]  chlorhexidine (PERIDEX) 0.12 % solution Use 15 mL in the mouth or throat 2 (two) times a day. 10/29/23   [provider]  [facility] VITAMIN B12 1000 MCG tablet TOME UNA TABLETA TODOS LOS DIAS 08/16/20   Donita Brooks, MD  diclofenac sodium (VOLTAREN) 1 % GEL Apply 2 g topically 4 (four) times daily. 03/18/19   Donita Brooks, MD  doxycycline (VIBRA-TABS) 100 MG tablet Take 100 mg by mouth 2 (two) times daily. 11/03/23   [provider]  erythromycin ophthalmic ointment Place 1 Application into the right eye once. 11/03/23   [provider]  glucose blood (BAYER CONTOUR TEST) test strip Check BS QAM fasting Patient not taking: Reported on 11/10/2023 11/11/17   Donita Brooks, MD  ibuprofen (ADVIL) 800 MG tablet Take by mouth. 10/29/23   [provider]  loratadine (CLARITIN) 10 MG tablet Take 1 tablet (10 mg total) by mouth daily. 05/21/23   Donita Brooks, MD  losartan  (COZAAR) 100 MG tablet TAKE 1 TABLET BY MOUTH EVERY MORNING 05/21/23   Donita Brooks, MD  metoprolol succinate (TOPROL-XL) 25 MG 24 hr tablet TOME UNA TABLETA POR VIA ORAL TODOS LOS DIAS 10/22/23   Donita Brooks, MD  mirtazapine (REMERON) 30 MG tablet Take 1 tablet (30 mg total) by mouth at bedtime. 11/10/23   Donita Brooks, MD  moxifloxacin (VIGAMOX) 0.5 % ophthalmic solution Place 1 drop into the right eye 3 (three) times daily. 11/03/23   [provider]  pantoprazole (PROTONIX) 40 MG tablet TOMAR UNA (1) TABLETA POR VIA ORAL UNA VEZ AL DIA 10/22/23   Donita Brooks, MD  rosuvastatin (CRESTOR) 10 MG tablet TOMAR 1 TABLETA POR VIA ORAL UNA VEZ AL DIA 10/22/23   Donita Brooks, MD  solifenacin (VESICARE) 10 MG tablet TOME UNA TABLETA TODOS LOS DIAS 05/21/23   Donita Brooks, MD  traMADol (ULTRAM) 50 MG tablet Take 1 tablet (50 mg total) by mouth every 12 (twelve) hours as needed. Patient not taking: Reported on 11/10/2023 09/23/23   Donita Brooks, MD      Allergies    Patient has no known allergies.    Review of Systems   Review of Systems  Physical Exam Updated Vital Signs BP 112/69   Pulse 82   Temp (!)  97.5 F (36.4 C)   Resp 19   Ht 5\' 2"  (1.575 m)   Wt 57.2 kg   SpO2 97%   BMI 23.05 kg/m  Physical Exam Vitals and nursing note reviewed.  Constitutional:      General: She is not in acute distress.    Appearance: She is well-developed. She is not diaphoretic.  HENT:     Head: Normocephalic and atraumatic.  Eyes:     Pupils: Pupils are equal, round, and reactive to light.  Cardiovascular:     Rate and Rhythm: Normal rate and regular rhythm.     Heart sounds: No murmur heard.    No friction rub. No gallop.  Pulmonary:     Effort: Pulmonary effort is normal.     Breath sounds: No wheezing or rales.  Abdominal:     General: There is no distension.     Palpations: Abdomen is soft.     Tenderness: There is abdominal tenderness.     Comments: Pain  worse to the epigastrium and right upper quadrant.  Musculoskeletal:        General: No tenderness.     Cervical back: Normal range of motion and neck supple.  Skin:    General: Skin is warm and dry.  Neurological:     Mental Status: She is alert and oriented to person, place, and time.  Psychiatric:        Behavior: Behavior normal.     ED Results / Procedures / Treatments   Labs (all labs ordered are listed, but only abnormal results are displayed) Labs Reviewed  COMPREHENSIVE METABOLIC PANEL WITH GFR - Abnormal; Notable for the following components:      Result Value   CO2 16 (*)    Glucose, Bld 123 (*)    BUN 28 (*)    Creatinine, Ser 1.45 (*)    Calcium 8.6 (*)    Total Protein 5.9 (*)    Albumin 2.8 (*)    Total Bilirubin 1.6 (*)    GFR, Estimated 36 (*)    All other components within normal limits  CBC WITH DIFFERENTIAL/PLATELET - Abnormal; Notable for the following components:   WBC 19.2 (*)    RDW 16.1 (*)    Neutro Abs 17.7 (*)    Lymphs Abs 0.6 (*)    Abs Immature Granulocytes 0.16 (*)    All other components within normal limits  PROTIME-INR - Abnormal; Notable for the following components:   Prothrombin Time 15.8 (*)    All other components within normal limits  LIPASE, BLOOD - Abnormal; Notable for the following components:   Lipase 407 (*)    All other components within normal limits  I-STAT CG4 LACTIC ACID, ED - Abnormal; Notable for the following components:   Lactic Acid, Venous 4.5 (*)    All other components within normal limits  I-STAT CHEM 8, ED - Abnormal; Notable for the following components:   BUN 34 (*)    Creatinine, Ser 1.40 (*)    Glucose, Bld 103 (*)    TCO2 20 (*)    All other components within normal limits  CULTURE, BLOOD (ROUTINE X 2)  CULTURE, BLOOD (ROUTINE X 2)  APTT  URINALYSIS, W/ REFLEX TO CULTURE (INFECTION SUSPECTED)  I-STAT CG4 LACTIC ACID, ED    EKG EKG Interpretation Date/Time:  Tuesday November 11 2023 21:27:57  EDT Ventricular Rate:  82 PR Interval:  180 QRS Duration:  134 QT Interval:  414 QTC Calculation: 484 R  Axis:   -60  Text Interpretation: Sinus rhythm RBBB and LAFB Baseline wander in lead(s) V3 No old tracing to compare Confirmed by Melene Plan [phone]) on 11/11/2023 10:29:18 PM  Radiology DG Chest Port 1 View Result Date: 11/11/2023 CLINICAL DATA:  Questionable sepsis - evaluate for abnormality. Dizziness, near syncope EXAM: PORTABLE CHEST 1 VIEW COMPARISON:  None Available. FINDINGS: Heart and mediastinal contours are within normal limits. No focal opacities or effusions. No acute bony abnormality. Aortic atherosclerosis. IMPRESSION: No active disease. Electronically Signed   By: Charlett Nose M.D.   On: 11/11/2023 22:58    Procedures Procedures    Medications Ordered in ED Medications  lactated ringers infusion (has no administration in time range)  lactated ringers bolus 1,000 mL (1,000 mLs Intravenous New Bag/Given 11/11/23 2258)  ceFEPIme (MAXIPIME) 2 g in sodium chloride 0.9 % 100 mL IVPB (2 g Intravenous New Bag/Given 11/11/23 2257)  metroNIDAZOLE (FLAGYL) IVPB 500 mg (has no administration in time range)  lactated ringers bolus 1,000 mL (0 mLs Intravenous Stopped 11/11/23 2258)    ED Course/ Medical Decision Making/ A&P                                 Medical Decision Making Amount and/or Complexity of Data Reviewed Labs: ordered. Radiology: ordered.  Risk Prescription drug management.   83 yo F with a cc of not feeling like eating and drinking.  Going on for a couple days.  Having some abdominal pain on exam.  Will obtain a laboratory evaluation CT abd pelvis.  bolus of IV fluids reassess.  Patient's lactate elevated 4.5.   leukocytosis of 19.2.  I-STAT Chem-8 without significant change to renal function.  Code sepsis initiated.  30 cc/kg of IV fluids given.  Given antibiotics for likely abdominal source.  Awaiting CT imaging.  Signed out to Dr. Blinda Leatherwood, please see their  note for further details of care in the ED.  The patients results and plan were reviewed and discussed.   Any x-rays performed were independently reviewed by myself.   Differential diagnosis were considered with the presenting HPI.  Medications  lactated ringers infusion (has no administration in time range)  lactated ringers bolus 1,000 mL (1,000 mLs Intravenous New Bag/Given 11/11/23 2258)  ceFEPIme (MAXIPIME) 2 g in sodium chloride 0.9 % 100 mL IVPB (2 g Intravenous New Bag/Given 11/11/23 2257)  metroNIDAZOLE (FLAGYL) IVPB 500 mg (has no administration in time range)  lactated ringers bolus 1,000 mL (0 mLs Intravenous Stopped 11/11/23 2258)    Vitals:   11/11/23 2106 11/11/23 2107 11/11/23 2130 11/11/23 2145  BP:  (!) 75/49 99/81 112/69  Pulse:  94 85 82  Resp:  18 19 19   Temp:      SpO2:  91% 100% 97%  Weight: 57.2 kg     Height: 5\' 2"  (1.575 m)       Final diagnoses:  Right upper quadrant abdominal pain    Admission/ observation were discussed with the admitting physician, patient and/or family and they are comfortable with the plan.          Final Clinical Impression(s) / ED Diagnoses Final diagnoses:  Right upper quadrant abdominal pain    Rx / DC Orders ED Discharge Orders     None         Melene Plan, DO 11/11/23 2319

## 2023-11-11 NOTE — Telephone Encounter (Signed)
 Copied from CRM [id number]. Topic: Clinical - Medical Advice >> Nov 11, 2023  3:05 PM Ja-Kwan M wrote: Reason for CRM: Patient daughter concerned that patient does not want to eat, drink or get out of bed. She stated patient had low blood pressure yesterday during appointment with the doctor. Patient daughter requests to speak with a nurse to advise what she should do  Chief Complaint: confusion Symptoms: not wanting to eat or drink, thinks she ate, not taking medications Frequency: started today Pertinent Negatives: Patient denies new medications, was seen by pcp yesterday Disposition: [x] ED /[] Urgent Care (no appt availability in office) / [] Appointment(In office/virtual)/ []  [place] [facility]/ [] Home Care/ [] Refused Recommended Disposition /[] [place] [facility]/ []  Follow-up with PCP Additional Notes: instructed to go to the ER.  Pcp office updated  Reason for Disposition  [1] Difficult to awaken or acting confused (e.g., disoriented, slurred speech) AND [2] present now AND [3] new-onset AND [4] has diabetes (diabetes mellitus)  Answer Assessment - Initial Assessment Questions 1. MAIN CONCERN OR SYMPTOM:  "What is your main concern right now?" "What questions do you have?" "What's the main symptom you're worried about?" (e.g., confusion, memory loss)     Confusion and weakness, not wanting to eat or drink 2. ONSET:  "When did the symptom start (or worsen)?" (minutes, hours, days, weeks)     today 3. BETTER-SAME-WORSE: "Are you (the patient) getting better, staying the same, or getting worse compared to the day you (they) were diagnosed or most recent hospital discharge ?"     Worse than yesterday 4. DIAGNOSIS: "Was the dementia diagnosed by a doctor?" If Yes, ask: "When?" (e.g., days, months, years ago)     denies 5. MEDICINES: "Has there been any change in medicines recently?" (e.g., narcotics, antihistamines, benzodiazepines, etc.)     denies 6. OTHER SYMPTOMS: "Are there any  other symptoms?" (e.g., fever, cough, pain, falling)     Doesn't want to take any medication 7. SUPPORT: Document living circumstances and support (e.g., family, nursing home)     Currently with daughter.  Protocols used: Dementia Symptoms and Questions-A-AH

## 2023-11-11 NOTE — Progress Notes (Signed)
 Elink monitoring for the code sepsis protocol.

## 2023-11-11 NOTE — ED Notes (Signed)
 This phlebotomist stuck the patient and was unable to obtain blood culture. RN aware

## 2023-11-12 ENCOUNTER — Inpatient Hospital Stay (HOSPITAL_COMMUNITY)

## 2023-11-12 ENCOUNTER — Other Ambulatory Visit: Payer: Self-pay

## 2023-11-12 DIAGNOSIS — R54 Age-related physical debility: Secondary | ICD-10-CM | POA: Diagnosis present

## 2023-11-12 DIAGNOSIS — I10 Essential (primary) hypertension: Secondary | ICD-10-CM | POA: Diagnosis present

## 2023-11-12 DIAGNOSIS — E785 Hyperlipidemia, unspecified: Secondary | ICD-10-CM | POA: Diagnosis present

## 2023-11-12 DIAGNOSIS — K859 Acute pancreatitis without necrosis or infection, unspecified: Secondary | ICD-10-CM | POA: Diagnosis present

## 2023-11-12 DIAGNOSIS — E861 Hypovolemia: Secondary | ICD-10-CM | POA: Insufficient documentation

## 2023-11-12 DIAGNOSIS — H5461 Unqualified visual loss, right eye, normal vision left eye: Secondary | ICD-10-CM | POA: Diagnosis present

## 2023-11-12 DIAGNOSIS — R933 Abnormal findings on diagnostic imaging of other parts of digestive tract: Secondary | ICD-10-CM

## 2023-11-12 DIAGNOSIS — H16001 Unspecified corneal ulcer, right eye: Secondary | ICD-10-CM | POA: Diagnosis present

## 2023-11-12 DIAGNOSIS — Z79899 Other long term (current) drug therapy: Secondary | ICD-10-CM | POA: Diagnosis not present

## 2023-11-12 DIAGNOSIS — D72829 Elevated white blood cell count, unspecified: Secondary | ICD-10-CM

## 2023-11-12 DIAGNOSIS — I9589 Other hypotension: Secondary | ICD-10-CM | POA: Diagnosis present

## 2023-11-12 DIAGNOSIS — R630 Anorexia: Secondary | ICD-10-CM | POA: Diagnosis not present

## 2023-11-12 DIAGNOSIS — Z7982 Long term (current) use of aspirin: Secondary | ICD-10-CM | POA: Diagnosis not present

## 2023-11-12 DIAGNOSIS — E43 Unspecified severe protein-calorie malnutrition: Secondary | ICD-10-CM | POA: Diagnosis present

## 2023-11-12 DIAGNOSIS — R1011 Right upper quadrant pain: Secondary | ICD-10-CM | POA: Diagnosis present

## 2023-11-12 DIAGNOSIS — K828 Other specified diseases of gallbladder: Secondary | ICD-10-CM | POA: Diagnosis not present

## 2023-11-12 DIAGNOSIS — E872 Acidosis, unspecified: Secondary | ICD-10-CM | POA: Diagnosis present

## 2023-11-12 DIAGNOSIS — F0390 Unspecified dementia without behavioral disturbance: Secondary | ICD-10-CM | POA: Diagnosis present

## 2023-11-12 DIAGNOSIS — K219 Gastro-esophageal reflux disease without esophagitis: Secondary | ICD-10-CM | POA: Diagnosis present

## 2023-11-12 DIAGNOSIS — Z6823 Body mass index (BMI) 23.0-23.9, adult: Secondary | ICD-10-CM | POA: Diagnosis not present

## 2023-11-12 DIAGNOSIS — Z1152 Encounter for screening for COVID-19: Secondary | ICD-10-CM | POA: Diagnosis not present

## 2023-11-12 DIAGNOSIS — Z87891 Personal history of nicotine dependence: Secondary | ICD-10-CM | POA: Diagnosis not present

## 2023-11-12 DIAGNOSIS — Z833 Family history of diabetes mellitus: Secondary | ICD-10-CM | POA: Diagnosis not present

## 2023-11-12 DIAGNOSIS — E876 Hypokalemia: Secondary | ICD-10-CM | POA: Diagnosis present

## 2023-11-12 LAB — HEPATIC FUNCTION PANEL
ALT: 12 U/L (ref 0–44)
AST: 26 U/L (ref 15–41)
Albumin: 2.5 g/dL — ABNORMAL LOW (ref 3.5–5.0)
Alkaline Phosphatase: 82 U/L (ref 38–126)
Bilirubin, Direct: 0.5 mg/dL — ABNORMAL HIGH (ref 0.0–0.2)
Indirect Bilirubin: 1.3 mg/dL — ABNORMAL HIGH (ref 0.3–0.9)
Total Bilirubin: 1.8 mg/dL — ABNORMAL HIGH (ref 0.0–1.2)
Total Protein: 5 g/dL — ABNORMAL LOW (ref 6.5–8.1)

## 2023-11-12 LAB — LIPID PANEL
Cholesterol: 71 mg/dL (ref 0–200)
HDL: 21 mg/dL — ABNORMAL LOW (ref >40)
LDL Cholesterol: 41 mg/dL (ref 0–99)
Total CHOL/HDL Ratio: 3.4 ratio
Triglycerides: 43 mg/dL (ref <150)
VLDL: 9 mg/dL (ref 0–40)

## 2023-11-12 LAB — URINALYSIS, W/ REFLEX TO CULTURE (INFECTION SUSPECTED)
Bilirubin Urine: NEGATIVE
Glucose, UA: NEGATIVE mg/dL
Hgb urine dipstick: NEGATIVE
Ketones, ur: NEGATIVE mg/dL
Leukocytes,Ua: NEGATIVE
Nitrite: NEGATIVE
Protein, ur: NEGATIVE mg/dL
Specific Gravity, Urine: 1.029 (ref 1.005–1.030)
pH: 5 (ref 5.0–8.0)

## 2023-11-12 LAB — CBC
HCT: 41.1 % (ref 36.0–46.0)
Hemoglobin: 13.4 g/dL (ref 12.0–15.0)
MCH: 29.8 pg (ref 26.0–34.0)
MCHC: 32.6 g/dL (ref 30.0–36.0)
MCV: 91.3 fL (ref 80.0–100.0)
Platelets: 145 10*3/uL — ABNORMAL LOW (ref 150–400)
RBC: 4.5 MIL/uL (ref 3.87–5.11)
RDW: 16.2 % — ABNORMAL HIGH (ref 11.5–15.5)
WBC: 19.1 10*3/uL — ABNORMAL HIGH (ref 4.0–10.5)
nRBC: 0 % (ref 0.0–0.2)

## 2023-11-12 LAB — BASIC METABOLIC PANEL WITH GFR
Anion gap: 10 (ref 5–15)
BUN: 25 mg/dL — ABNORMAL HIGH (ref 8–23)
CO2: 21 mmol/L — ABNORMAL LOW (ref 22–32)
Calcium: 8.4 mg/dL — ABNORMAL LOW (ref 8.9–10.3)
Chloride: 106 mmol/L (ref 98–111)
Creatinine, Ser: 1.18 mg/dL — ABNORMAL HIGH (ref 0.44–1.00)
GFR, Estimated: 46 mL/min — ABNORMAL LOW (ref >60)
Glucose, Bld: 79 mg/dL (ref 70–99)
Potassium: 4.3 mmol/L (ref 3.5–5.1)
Sodium: 137 mmol/L (ref 135–145)

## 2023-11-12 LAB — I-STAT CG4 LACTIC ACID, ED
Lactic Acid, Venous: 2.9 mmol/L (ref 0.5–1.9)
Lactic Acid, Venous: 4.5 mmol/L (ref 0.5–1.9)

## 2023-11-12 LAB — LACTIC ACID, PLASMA
Lactic Acid, Venous: 3.5 mmol/L (ref 0.5–1.9)
Lactic Acid, Venous: 1.6 mmol/L (ref 0.5–1.9)
Lactic Acid, Venous: 1.1 mmol/L (ref 0.5–1.9)

## 2023-11-12 LAB — PHOSPHORUS: Phosphorus: 2.7 mg/dL (ref 2.5–4.6)

## 2023-11-12 LAB — MAGNESIUM: Magnesium: 1.6 mg/dL — ABNORMAL LOW (ref 1.7–2.4)

## 2023-11-12 MED ORDER — HYDROMORPHONE HCL 1 MG/ML IJ SOLN: 0.5000 mg | INTRAMUSCULAR | Status: DC | PRN

## 2023-11-12 MED ORDER — POLYETHYLENE GLYCOL 3350 17 G PO PACK: 17.0000 g | PACK | Freq: Every day | ORAL | Status: DC | PRN

## 2023-11-12 MED ORDER — MELATONIN 3 MG PO TABS
6.0000 mg | ORAL_TABLET | Freq: Every evening | ORAL | Status: DC | PRN
Start: 2023-11-12 — End: 2023-11-13

## 2023-11-12 MED ORDER — OXYCODONE HCL 5 MG PO TABS: 5.0000 mg | ORAL_TABLET | Freq: Four times a day (QID) | ORAL | Status: DC | PRN

## 2023-11-12 MED ORDER — OXYCODONE HCL 5 MG PO TABS
2.5000 mg | ORAL_TABLET | Freq: Four times a day (QID) | ORAL | Status: DC | PRN
  Administered 2023-11-12 (×2): 2.5 mg via ORAL
  Filled 2023-11-12 (×3): qty 1

## 2023-11-12 MED ORDER — ROSUVASTATIN CALCIUM 5 MG PO TABS
10.0000 mg | ORAL_TABLET | Freq: Every day | ORAL | Status: DC
  Administered 2023-11-12 – 2023-11-14 (×3): 10 mg via ORAL
  Filled 2023-11-12 (×4): qty 2

## 2023-11-12 MED ORDER — HEPARIN SODIUM (PORCINE) 5000 UNIT/ML IJ SOLN
5000.0000 [IU] | Freq: Three times a day (TID) | INTRAMUSCULAR | Status: DC
  Administered 2023-11-12 – 2023-11-16 (×8): 5000 [IU] via SUBCUTANEOUS
  Filled 2023-11-12 (×10): qty 1

## 2023-11-12 MED ORDER — MAGNESIUM SULFATE 4 GM/100ML IV SOLN
4.0000 g | Freq: Once | INTRAVENOUS | Status: AC
  Administered 2023-11-12: 4 g via INTRAVENOUS
  Filled 2023-11-12: qty 100

## 2023-11-12 MED ORDER — ONDANSETRON HCL 4 MG/2ML IJ SOLN: 4.0000 mg | Freq: Four times a day (QID) | INTRAMUSCULAR | Status: DC | PRN

## 2023-11-12 MED ORDER — ACETAMINOPHEN 500 MG PO TABS: 1000.0000 mg | ORAL_TABLET | Freq: Four times a day (QID) | ORAL | Status: DC | PRN

## 2023-11-12 MED ORDER — HALOPERIDOL LACTATE 5 MG/ML IJ SOLN: 1.0000 mg | Freq: Four times a day (QID) | INTRAMUSCULAR | Status: DC | PRN

## 2023-11-12 MED ORDER — PANTOPRAZOLE SODIUM 40 MG PO TBEC
40.0000 mg | DELAYED_RELEASE_TABLET | Freq: Every day | ORAL | Status: DC
  Administered 2023-11-12 – 2023-11-14 (×3): 40 mg via ORAL
  Filled 2023-11-12 (×4): qty 1

## 2023-11-12 MED ORDER — MIRTAZAPINE 15 MG PO TABS
30.0000 mg | ORAL_TABLET | Freq: Every day | ORAL | Status: DC
  Administered 2023-11-12 – 2023-11-15 (×4): 30 mg via ORAL
  Filled 2023-11-12 (×4): qty 2

## 2023-11-12 NOTE — Progress Notes (Signed)
 This is a no charge note, patient was admitted after midnight.  Please see admission H&P.  Briefly, patient is a 83 year old female with a history of hypertension, hyperlipidemia, diabetes who presented with poor p.o. intake and complaints of upper abdominal discomfort.  In the emergency department, patient was found to be hypotensive with an elevated lactic acid.  Patient was noted to have acute pancreatitis on CT abdomen pelvis with an 8 mm hypodense pancreatic neck lesion seen.  Patient was continued on IV fluid hydration and gastroenterology was consulted  At present, vital signs are stable with blood pressure 111/90, afebrile, 97% on room air Laboratory findings notable for magnesium of 1.6, lactic acid 3.5, alk phos 82, AST 26, ALT of 12  A/P Acute pancreatitis with 8 mm hypodense pancreatic neck lesion -Gastroenterology is consulted and following -Recommendation for right upper quadrant ultrasound to rule out gallstones -Per GI, recommendation for follow-up MRCP to further evaluate once patient is through her acute episode -Continue hydration as tolerated  Presenting hypotension with history of hypertension -Continue basal fluids as tolerated -Blood pressure currently stable and well-controlled  Lactic acidosis -Will continue basal IV fluids -Follow serial lactic acid levels  Leukocytosis -Urinalysis unremarkable, chest x-ray reviewed, unremarkable -Blood cultures pending, thus far negative -Follow WBC trends  Recent corneal ulcer -Continue atropine drops, moxifloxacin drops, erythromycin ointment  History of dementia -Seems stable at this time -Continue supportive care  Hyperlipidemia -Continued on Crestor  Diabetes -Will continue patient on sliding scale insulin as needed -Recheck hemoglobin A1c  Rickey Barbara, MD [facility]

## 2023-11-12 NOTE — H&P (Signed)
 History and Physical    [name] WUJ:[id number] DOB: January 18, 1941 DOA: 11/11/2023  PCP: Donita Brooks, MD   Patient coming from: Home   Chief Complaint: Presyncope    HPI: Translation provided by patient's daughter at her request Claudia [name] is a 83 y.o. female, Spanish-speaking, with hx of dementia, recent concern for failure to thrive, hypertension, hyperlipidemia, diabetes, IDA, recent admission to [facility] for corneal ulcer, who was brought in from home due to presyncopal episode.  Daughter reports over the past 2 days she has taken minimal p.o.'s.  Burgess Estelle started complaining of epigastric abdominal pain.  She has had nausea and an episode of vomiting.  No hematemesis/coffee-ground emesis, melena, hematochezia.  No prior history of pancreatitis.  No alcohol use.   Today she was using the bathroom and afterwards unable to stand looked presyncopal so daughter called EMS.  On EMS arrival she was hypotensive and they transported to the ED.   Review of Systems:  ROS complete and negative except as marked above   No Known Allergies  Prior to Admission medications   Medication Sig Start Date End Date Taking? Authorizing Provider  aspirin 81 MG tablet Take 1 tablet (81 mg total) by mouth daily. 08/03/14   Allayne Butcher B, PA-C  atropine 1 % ophthalmic solution Place 1 drop into the right eye 4 (four) times daily. 11/03/23   [provider]  chlorhexidine (PERIDEX) 0.12 % solution Use 15 mL in the mouth or throat 2 (two) times a day. 10/29/23   [provider]  [facility] VITAMIN B12 1000 MCG tablet TOME UNA TABLETA TODOS LOS DIAS 08/16/20   Donita Brooks, MD  diclofenac sodium (VOLTAREN) 1 % GEL Apply 2 g topically 4 (four) times daily. 03/18/19   Donita Brooks, MD  doxycycline (VIBRA-TABS) 100 MG tablet Take 100 mg by mouth 2 (two) times daily. 11/03/23   [provider]  erythromycin ophthalmic ointment Place 1 Application into the right eye once.  11/03/23   [provider]  glucose blood (BAYER CONTOUR TEST) test strip Check BS QAM fasting Patient not taking: Reported on 11/10/2023 11/11/17   Donita Brooks, MD  ibuprofen (ADVIL) 800 MG tablet Take by mouth. 10/29/23   [provider]  loratadine (CLARITIN) 10 MG tablet Take 1 tablet (10 mg total) by mouth daily. 05/21/23   Donita Brooks, MD  losartan (COZAAR) 100 MG tablet TAKE 1 TABLET BY MOUTH EVERY MORNING 05/21/23   Donita Brooks, MD  metoprolol succinate (TOPROL-XL) 25 MG 24 hr tablet TOME UNA TABLETA POR VIA ORAL TODOS LOS DIAS 10/22/23   Donita Brooks, MD  mirtazapine (REMERON) 30 MG tablet Take 1 tablet (30 mg total) by mouth at bedtime. 11/10/23   Donita Brooks, MD  moxifloxacin (VIGAMOX) 0.5 % ophthalmic solution Place 1 drop into the right eye 3 (three) times daily. 11/03/23   [provider]  pantoprazole (PROTONIX) 40 MG tablet TOMAR UNA (1) TABLETA POR VIA ORAL UNA VEZ AL DIA 10/22/23   Donita Brooks, MD  rosuvastatin (CRESTOR) 10 MG tablet TOMAR 1 TABLETA POR VIA ORAL UNA VEZ AL DIA 10/22/23   Donita Brooks, MD  solifenacin (VESICARE) 10 MG tablet TOME UNA TABLETA TODOS LOS DIAS 05/21/23   Donita Brooks, MD  traMADol (ULTRAM) 50 MG tablet Take 1 tablet (50 mg total) by mouth every 12 (twelve) hours as needed. Patient not taking: Reported on 11/10/2023 09/23/23   Donita Brooks, MD  Past Medical History:  Diagnosis Date   Anemia    Arthritis    B12 deficiency 01/11/2020   Blind right eye    Diabetes mellitus without complication (HCC)    GERD (gastroesophageal reflux disease)    chronic gastritis   Hyperlipidemia    Hypertension     Past Surgical History:  Procedure Laterality Date   HERNIA REPAIR     INDUCED ABORTION       reports that she quit smoking about 44 years ago. Her smoking use included cigarettes. She has never used smokeless tobacco. She reports that she does not drink alcohol and does not use  drugs.  Family History  Problem Relation Age of Onset   Diabetes Sister    Diabetes Daughter      Physical Exam: Vitals:   11/11/23 2130 11/11/23 2145 11/11/23 2200 11/12/23 0050  BP: 99/81 112/69 115/63 111/75  Pulse: 85 82 86 90  Resp: 19 19 16 16   Temp:      SpO2: 100% 97% 99% 99%  Weight:      Height:        Gen: Awake, alert, chronically ill-appearing, elderly, frail HEENT: Anisocoria OD > OS, corneal opacity on the nasal aspect right eye.  OD minimally reactive compared to OS.  CV: Regular, normal S1, S2, no murmurs  Resp: Normal WOB, CTAB  Abd: Flat, hypoactive, mild epigastric tenderness  MSK: Symmetric, no edema  Skin: No rashes or lesions to exposed skin  Neuro: Alert and interactive  Psych: euthymic, appropriate    Data review:   Labs reviewed, notable for:   Lactate 4.5 -> 2.9 Bicarb 16, anion gap 13 Creatinine 1.4 (near baseline) T. bili 1.6, other LFT normal Lipase 407 WBC 19  Micro:  Results for orders placed or performed in visit on 08/16/19  SARS Coronavirus 2 ([facility] ONLY)     Status: None   Collection Time: 08/17/19 12:00 AM   Specimen: Nasopharyngeal Swab  Result Value Ref Range Status   SARS Coronavirus 2 RESULT:  NEGATIVE  Final    Comment: RESULT:  NEGATIVESARS-CoV-2 INTERPRETATION:A NEGATIVE  test result means that SARS-CoV-2 RNA was not present in the specimen above the limit of detection of this test. This does not preclude a possible SARS-CoV-2 infection and should not be used as the  sole basis for patient management decisions. Negative results must be combined with clinical observations, patient history, and epidemiological information. Optimum specimen types and timing for peak viral levels during infections caused by SARS-CoV-2  have not been determined. Collection of multiple specimens or types of specimens may be necessary to detect virus. Improper specimen collection and handling, sequence variability under primers/probes,  or organism present below the limit of detection may  lead to false negative results. Positive and negative predictive values of testing are highly dependent on prevalence. False negative test results are more likely when prevalence of disease is high.The expected result is NEGATIVE.Fact  Sheet for  Healthcare Providers: quierodirigir.com.Fact Sheet for Patients: HairSlick.no.Normal Reference Range - Negative     Imaging reviewed:  CT ABDOMEN PELVIS W CONTRAST Result Date: 11/11/2023 CLINICAL DATA:  Abdominal pain EXAM: CT ABDOMEN AND PELVIS WITH CONTRAST TECHNIQUE: Multidetector CT imaging of the abdomen and pelvis was performed using the standard protocol following bolus administration of intravenous contrast. RADIATION DOSE REDUCTION: This exam was performed according to the departmental dose-optimization program which includes automated exposure control, adjustment of the mA and/or kV according to patient size and/or use of iterative  reconstruction technique. CONTRAST:  75mL OMNIPAQUE IOHEXOL 350 MG/ML SOLN COMPARISON:  None Available. FINDINGS: Lower chest: Linear and ground-glass opacities in the lung bases, favor atelectasis. Coronary artery disease, aortic atherosclerosis. Hepatobiliary: No focal hepatic abnormality. Gallbladder unremarkable. Pancreas: 8 mm low-density lesion in the pancreatic neck. No ductal dilatation. Questionable surrounding inflammation. Recommend clinical correlation to exclude pancreatitis. Spleen: No focal abnormality.  Normal size. Adrenals/Urinary Tract: No adrenal abnormality. No focal renal abnormality. No stones or hydronephrosis. Urinary bladder is unremarkable. Stomach/Bowel: Mild gaseous distension of the transverse colon. Moderate stool burden in the right colon and transverse colon. Stomach and small bowel decompressed. No bowel obstruction or inflammatory process. Vascular/Lymphatic: Aortic atherosclerosis. No  evidence of aneurysm or adenopathy. Reproductive: Prior hysterectomy.  No adnexal masses. Other: Small amount of free fluid in the pelvis.  No free air. Musculoskeletal: No acute bony abnormality. Degenerative disc and facet disease throughout the lumbar spine. IMPRESSION: Haziness around the pancreas may reflect inflammation. Recommend clinical correlation for acute pancreatitis. 8 mm low-density lesion in the pancreatic neck. This could be followed with repeat CT in 6 months. Aortic atherosclerosis. Small amount of free fluid in the pelvis. Moderate stool burden in the right and transverse colon. Electronically Signed   By: Charlett Nose M.D.   On: 11/11/2023 23:48   DG Chest Port 1 View Result Date: 11/11/2023 CLINICAL DATA:  Questionable sepsis - evaluate for abnormality. Dizziness, near syncope EXAM: PORTABLE CHEST 1 VIEW COMPARISON:  None Available. FINDINGS: Heart and mediastinal contours are within normal limits. No focal opacities or effusions. No acute bony abnormality. Aortic atherosclerosis. IMPRESSION: No active disease. Electronically Signed   By: Charlett Nose M.D.   On: 11/11/2023 22:58     ED Course:  Treated with 2 L IV fluid and started on 100 cc cc an hour, with concern for sepsis treated with cefepime and Flagyl.   Assessment/Plan:  83 y.o. female with hx Spanish-speaking, with hx of dementia, recent concern for failure to thrive, hypertension, hyperlipidemia, diabetes, IDA, recent admission to [facility]: [facility] for corneal ulcer, who was brought in from home due to presyncopal episode.  Found to be hypotensive, likely hypovolemic in the setting of decreased p.o. related to acute pancreatitis.   Acute pancreatitis 8 mm hypodense pancreatic neck lesion 2 days of decreased p.o., epigastric abdominal pain, 1 episode of vomiting.  No prior episodes of pancreatitis.  Lipase 407.  LFT with T. bili 1.6 otherwise normal.  CT demonstrating 8 mm hypodense pancreatic neck lesion with surrounding  inflammatory changes, no pancreatic ductal dilation.  No biliary dilation on imaging. Etiology appears to be 2/2 pancreatic neck lesion.  - GI consult, patient of [facility]. Messaged Dr. Tomasa Rand for group to see in AM re: further eval of pancreatic neck lesion  - S/p 2 L IV fluid, continue LR at 150 cc an hour - Clear liquid diet if able to tolerate if not can scale back to n.p.o. with sips; advance as tolerates - Check lipids - Pain control: Tylenol as needed mild, oxycodone 2.5/5 mg every 6 hours as needed for moderate/severe, Dilaudid 0.5 mg IV every 4 hours for breakthrough.  Hypotension, hypokalemia Lactic acidosis On ED arrival hypotensive in 70s over 50s, resolved after IV fluid resuscitation.  Associated lactic acidosis 4.5 -> 2.9.  - Continue IV fluids per above. - Check lactate in a.m. -Hold her home antihypertensives including losartan, metoprolol (these were recently discontinued at primary care appointment and should be discontinued at time of discharge med rec)  Leukocytosis WBC 19, no other SIRS criteria although was hypotensive per above.  Hypotension appears to be hypovolemic and resolved.  WBC suspect reactive in the setting of underlying pancreatitis.  -Initially treated with intraabd coverage with cefepime, Flagyl in ED. Stop Abx as do not feel there is underlying infection.  -Follow-up blood cultures  Isolated elevation in T. Bili -Trend LFT in setting of pancreatitis  Recent corneal ulcer OD  -Continue atropine gtt, moxifloxacin GTTs, erythromycin ointment.   Chronic medical problems: History of dementia, recent concern for failure to thrive: Delirium precautions, fall precautions, Haldol as needed for agitation if not redirectable and risk to self or others. Continue home Mirtazapine 30 mg at bedtime  History of hypertension, with recent hypotension: See above, holding home losartan and metoprolol. Hyperlipidemia: Continue home rosuvastatin Diabetes: Diet  controlled, not currently requiring SSI.  Can add if runs high. History IDA: Not an active problem, suspect she is hemoconcentrated with hemoglobin above recent baseline.  Body mass index is 23.05 kg/m.    DVT prophylaxis:  SQ Heparin Code Status:  Full Code Diet:  Diet Orders (From admission, onward)     Start     Ordered   11/12/23 0221  Diet clear liquid Room service appropriate? Yes; Fluid consistency: Thin  Diet effective now       Question Answer Comment  Room service appropriate? Yes   Fluid consistency: Thin      11/12/23 0221           Family Communication: Yes discussed with daughter at the bedside Consults: GI [facility] Admission status:   Inpatient, Telemetry bed  Severity of Illness: The appropriate patient status for this patient is INPATIENT. Inpatient status is judged to be reasonable and necessary in order to provide the required intensity of service to ensure the patient's safety. The patient's presenting symptoms, physical exam findings, and initial radiographic and laboratory data in the context of their chronic comorbidities is felt to place them at high risk for further clinical deterioration. Furthermore, it is not anticipated that the patient will be medically stable for discharge from the hospital within 2 midnights of admission.   * I certify that at the point of admission it is my clinical judgment that the patient will require inpatient hospital care spanning beyond 2 midnights from the point of admission due to high intensity of service, high risk for further deterioration and high frequency of surveillance required.*   Claudia Rias, MD [facility]  How to contact the [facility] Attending or Consulting provider 7A - 7P or covering provider during after hours 7P -7A, for this patient.  Check the care team in [facility] and look for a) attending/consulting [facility] provider listed and b) the [facility] team listed Log into [email or web address] and use Clarissa's universal  password to access. If you do not have the password, please contact the hospital operator. Locate the [facility] provider you are looking for under [facility] and page to a number that you can be directly reached. If you still have difficulty reaching the provider, please page the [facility] (Director on Call) for the Hospitalists listed on amion for assistance.  11/12/2023, 2:58 AM

## 2023-11-12 NOTE — ED Notes (Signed)
 [facility] notified of critical lactic. Recommended maintain current treatment

## 2023-11-12 NOTE — Consult Note (Signed)
 Consultation Note   Referring Provider:  [facility] PCP: Donita Brooks, MD Primary Gastroenterologist::   Ileene Patrick, MD       Reason for Consultation: Acute pancreatitis /pancreatic lesion DOA: 11/11/2023         Hospital Day: 2   ASSESSMENT    83 y.o.year old Spanish speaking female with  Acute Pancreatitis Lactic acidosis Leukocytosis Acute pancreatitis possibly due to medication ( ARB or statin) vrs underlying pancreatic malignancy ( small pancreatic lesion on CT scan). No Etoh use. Doubt biliary in absence of bile duct dilation / cholestasis ( Tbili elevation predominantly indirect). Patient non-toxic appearing. Hct 41%. Renal function is around baseline ( ? CKD).   Dementia, recently diagnosed per daughter PCP recently stopped Protonix  History of severe esophagitis / candida HIstory of chronic gastritis with focal antral metaplasia  See PMH for additional history     PLAN:   --Supportive care for pancreatitis.  --Continue clear liquids for now --Will need eventual MRI to evaluation pancreatic lesions ( generally will wait until inflammation has resolved to imagine) --Will get RUQ to evaluate for gallstones --PCP stopped Pantoprazole a few days ( concern to dementia?).  Will consider starting Pepcid at some point.   HPI   **  Non-English speaking. Consultation completed with Daughter who wanted to serve as interpreter  Patient brought to ED 11/11/23 for evaluation of decreased appetite and dizziness.  Saw PCP a few days ago and wasn't having any abdominal pain at that time.  However the following day patient complained to her daughter about upper abdominal pain and subsequently had an episode of nausea and vomiting. In the ED she was hypotensive with systolic BP in the 70s. White count significantly elevated .  T. bili mildly elevated, alk phos normal.  Lipase  elevated at 407. CT scan demonstrated 8 mm  hypodense pancreatic neck lesion with possible surrounding inflammatory changes. Started on IV antibiotics . However, hypotension felt to be secondary to hypovolemia rather than SIRS and so antibiotics were stopped.  Status post 2 L IV fluid followed by maintenance IV fluids at 100 cc an hour  Today her bilirubin is up to 1.8 but predominantly indirect.  White count remains high at 19.  Tmax 99.2.  Hypotension resolved.  Patient has not eaten today.  She has continued to complain about upper abdominal pain.     Previous GI Evaluation   EGD 08/19/2019  Findings: - 2cm HH - Mild esophagitis was found 35 cm from the incisors at the GEJ. - Localized mucosal changes were found in the lower third of the esophagus at 32cm, focal area of irregular appearing mucosa. Biopsies were taken with a cold forceps for histology. - Patchy, white and yellow plaques were found in the middle third of the esophagus and in the lower third of the esophagus, consistent with esophageal candidiasis. - The exam of the esophagus was otherwise normal. No focal stricture / stenosis. - Diffuse mild inflammation characterized by erosions and erythema was found in the entire examined stomach. The mucosa was diffusely atrophic throughout. Biopsies were taken with a cold forceps for Helicobacter pylori testing and histology. - A single 8 to 10 mm papule (nodule) was found on the anterior  wall of the gastric body, subepithelial appearing. Biopsies were taken with a cold forceps for histology. - A single 6 mm papule (nodule) was found in the gastric fundus with an area of central umbilication. Biopsies were taken with a cold forceps for histology. - The exam of the stomach was otherwise normal. - The duodenal bulb and second portion of the duodenum were congested but otherwise Normal.   1. Surgical [P], gastric antrum and gastric body - CHRONIC GASTRITIS. - NO SUBMUCOSA PRESENT FOR EVALUATION. 2. Surgical [P], gastric body -  CHRONIC GASTRITIS WITH FOCAL ANTRAL METAPLASIA. - NO SUBMUCOSA PRESENT FOR EVALUATION. 3. Surgical [P], gastric fundus biopsy - CHRONIC GASTRITIS WITH FOCAL ANTRAL METAPLASIA. - NO SUBMUCOSA PRESENT FOR EVALUATION. 4. Surgical [P], esophagus - SEVERE ACUTE ESOPHAGITIS WITH ULCERATION.   Labs and Imaging:  Recent Labs    11/10/23 1202 11/11/23 2114 11/11/23 2219 11/12/23 0502  WBC 8.4 19.2*  --  19.1*  HGB 12.0 13.8 14.6 13.4  HCT 37.5 42.6 43.0 41.1  MCV 93.3 93.6  --  91.3  PLT 180 180  --  145*   Recent Labs    11/10/23 1202 11/11/23 2114 11/11/23 2219 11/12/23 0502  NA 140 137 139 137  K 4.9 4.1 4.2 4.3  CL 111* 108 107 106  CO2 21 16*  --  21*  GLUCOSE 116* 123* 103* 79  BUN 37* 28* 34* 25*  CREATININE 1.35* 1.45* 1.40* 1.18*  CALCIUM 8.5* 8.6*  --  8.4*   Recent Labs    11/10/23 1202 11/11/23 2114 11/12/23 0502  PROT 5.4* 5.9* 5.0*  ALBUMIN  --  2.8* 2.5*  AST 14 30 26   ALT 7 13 12   ALKPHOS  --  100 82  BILITOT 0.7 1.6* 1.8*  BILIDIR  --   --  0.5*  IBILI  --   --  1.3*   Recent Labs    11/11/23 2114  INR 1.2   No results for input(s): "AFPTUMOR" in the last 72 hours.  CT ABDOMEN PELVIS W CONTRAST CLINICAL DATA:  Abdominal pain  EXAM: CT ABDOMEN AND PELVIS WITH CONTRAST  TECHNIQUE: Multidetector CT imaging of the abdomen and pelvis was performed using the standard protocol following bolus administration of intravenous contrast.  RADIATION DOSE REDUCTION: This exam was performed according to the departmental dose-optimization program which includes automated exposure control, adjustment of the mA and/or kV according to patient size and/or use of iterative reconstruction technique.  CONTRAST:  75mL OMNIPAQUE IOHEXOL 350 MG/ML SOLN  COMPARISON:  None Available.  FINDINGS: Lower chest: Linear and ground-glass opacities in the lung bases, favor atelectasis. Coronary artery disease, aortic atherosclerosis.  Hepatobiliary: No focal  hepatic abnormality. Gallbladder unremarkable.  Pancreas: 8 mm low-density lesion in the pancreatic neck. No ductal dilatation. Questionable surrounding inflammation. Recommend clinical correlation to exclude pancreatitis.  Spleen: No focal abnormality.  Normal size.  Adrenals/Urinary Tract: No adrenal abnormality. No focal renal abnormality. No stones or hydronephrosis. Urinary bladder is unremarkable.  Stomach/Bowel: Mild gaseous distension of the transverse colon. Moderate stool burden in the right colon and transverse colon. Stomach and small bowel decompressed. No bowel obstruction or inflammatory process.  Vascular/Lymphatic: Aortic atherosclerosis. No evidence of aneurysm or adenopathy.  Reproductive: Prior hysterectomy.  No adnexal masses.  Other: Small amount of free fluid in the pelvis.  No free air.  Musculoskeletal: No acute bony abnormality. Degenerative disc and facet disease throughout the lumbar spine.  IMPRESSION: Haziness around the pancreas may reflect inflammation. Recommend clinical  correlation for acute pancreatitis.  8 mm low-density lesion in the pancreatic neck. This could be followed with repeat CT in 6 months.  Aortic atherosclerosis.  Small amount of free fluid in the pelvis.  Moderate stool burden in the right and transverse colon.  Electronically Signed   By: Charlett Nose M.D.   On: 11/11/2023 23:48 DG Chest Port 1 View CLINICAL DATA:  Questionable sepsis - evaluate for abnormality. Dizziness, near syncope  EXAM: PORTABLE CHEST 1 VIEW  COMPARISON:  None Available.  FINDINGS: Heart and mediastinal contours are within normal limits. No focal opacities or effusions. No acute bony abnormality. Aortic atherosclerosis.  IMPRESSION: No active disease.  Electronically Signed   By: Charlett Nose M.D.   On: 11/11/2023 22:58   Past Medical History:  Diagnosis Date   Anemia    Arthritis    B12 deficiency 01/11/2020   Blind right  eye    Diabetes mellitus without complication (HCC)    GERD (gastroesophageal reflux disease)    chronic gastritis   Hyperlipidemia    Hypertension     Past Surgical History:  Procedure Laterality Date   HERNIA REPAIR     INDUCED ABORTION      Family History  Problem Relation Age of Onset   Diabetes Sister    Diabetes Daughter     Prior to Admission medications   Medication Sig Start Date End Date Taking? Authorizing Provider  aspirin 81 MG tablet Take 1 tablet (81 mg total) by mouth daily. 08/03/14   Allayne Butcher B, PA-C  atropine 1 % ophthalmic solution Place 1 drop into the right eye 4 (four) times daily. 11/03/23   [provider]  chlorhexidine (PERIDEX) 0.12 % solution Use 15 mL in the mouth or throat 2 (two) times a day. 10/29/23   [provider]  [facility] VITAMIN B12 1000 MCG tablet TOME UNA TABLETA TODOS LOS DIAS 08/16/20   Donita Brooks, MD  diclofenac sodium (VOLTAREN) 1 % GEL Apply 2 g topically 4 (four) times daily. 03/18/19   Donita Brooks, MD  doxycycline (VIBRA-TABS) 100 MG tablet Take 100 mg by mouth 2 (two) times daily. 11/03/23   [provider]  erythromycin ophthalmic ointment Place 1 Application into the right eye once. 11/03/23   [provider]  glucose blood (BAYER CONTOUR TEST) test strip Check BS QAM fasting Patient not taking: Reported on 11/10/2023 11/11/17   Donita Brooks, MD  ibuprofen (ADVIL) 800 MG tablet Take by mouth. 10/29/23   [provider]  loratadine (CLARITIN) 10 MG tablet Take 1 tablet (10 mg total) by mouth daily. 05/21/23   Donita Brooks, MD  losartan (COZAAR) 100 MG tablet TAKE 1 TABLET BY MOUTH EVERY MORNING 05/21/23   Donita Brooks, MD  metoprolol succinate (TOPROL-XL) 25 MG 24 hr tablet TOME UNA TABLETA POR VIA ORAL TODOS LOS DIAS 10/22/23   Donita Brooks, MD  mirtazapine (REMERON) 30 MG tablet Take 1 tablet (30 mg total) by mouth at bedtime. 11/10/23   Donita Brooks, MD   moxifloxacin (VIGAMOX) 0.5 % ophthalmic solution Place 1 drop into the right eye 3 (three) times daily. 11/03/23   [provider]  pantoprazole (PROTONIX) 40 MG tablet TOMAR UNA (1) TABLETA POR VIA ORAL UNA VEZ AL DIA 10/22/23   Donita Brooks, MD  rosuvastatin (CRESTOR) 10 MG tablet TOMAR 1 TABLETA POR VIA ORAL UNA VEZ AL DIA 10/22/23   Donita Brooks, MD  solifenacin (VESICARE)  10 MG tablet TOME UNA TABLETA TODOS LOS DIAS 05/21/23   Donita Brooks, MD  traMADol (ULTRAM) 50 MG tablet Take 1 tablet (50 mg total) by mouth every 12 (twelve) hours as needed. Patient not taking: Reported on 11/10/2023 09/23/23   Donita Brooks, MD    Current Facility-Administered Medications  Medication Dose Route Frequency Provider Last Rate Last Admin   acetaminophen (TYLENOL) tablet 1,000 mg  1,000 mg Oral Q6H PRN Dolly Rias, MD       haloperidol lactate (HALDOL) injection 1 mg  1 mg Intravenous Q6H PRN Dolly Rias, MD       heparin injection 5,000 Units  5,000 Units Subcutaneous Bebe Liter, MD   5,000 Units at 11/12/23 0545   HYDROmorphone (DILAUDID) injection 0.5 mg  0.5 mg Intravenous Q4H PRN Dolly Rias, MD       lactated ringers infusion   Intravenous Continuous Melene Plan, DO 150 mL/hr at 11/12/23 0804 New Bag at 11/12/23 0804   melatonin tablet 6 mg  6 mg Oral QHS PRN Dolly Rias, MD       mirtazapine (REMERON) tablet 30 mg  30 mg Oral QHS Dolly Rias, MD       ondansetron [facility]) injection 4 mg  4 mg Intravenous Q6H PRN Dolly Rias, MD       oxyCODONE (Oxy IR/ROXICODONE) immediate release tablet 2.5 mg  2.5 mg Oral Q6H PRN Dolly Rias, MD   2.5 mg at 11/12/23 0400   Or   oxyCODONE (Oxy IR/ROXICODONE) immediate release tablet 5 mg  5 mg Oral Q6H PRN Segars, Christiane Ha, MD       pantoprazole (PROTONIX) EC tablet 40 mg  40 mg Oral Daily Segars, Christiane Ha, MD   40 mg at 11/12/23 0900   polyethylene glycol (MIRALAX / GLYCOLAX) packet 17 g  17 g  Oral Daily PRN Dolly Rias, MD       rosuvastatin (CRESTOR) tablet 10 mg  10 mg Oral Daily Segars, Christiane Ha, MD   10 mg at 11/12/23 0900   Current Outpatient Medications  Medication Sig Dispense Refill   aspirin 81 MG tablet Take 1 tablet (81 mg total) by mouth daily. 30 tablet 11   atropine 1 % ophthalmic solution Place 1 drop into the right eye 4 (four) times daily.     chlorhexidine (PERIDEX) 0.12 % solution Use 15 mL in the mouth or throat 2 (two) times a day.     [facility] VITAMIN B12 1000 MCG tablet TOME UNA TABLETA TODOS LOS DIAS 200 tablet 5   diclofenac sodium (VOLTAREN) 1 % GEL Apply 2 g topically 4 (four) times daily. 100 g 2   doxycycline (VIBRA-TABS) 100 MG tablet Take 100 mg by mouth 2 (two) times daily.     erythromycin ophthalmic ointment Place 1 Application into the right eye once.     glucose blood (BAYER CONTOUR TEST) test strip Check BS QAM fasting (Patient not taking: Reported on 11/10/2023) 50 each 5   ibuprofen (ADVIL) 800 MG tablet Take by mouth.     loratadine (CLARITIN) 10 MG tablet Take 1 tablet (10 mg total) by mouth daily. 90 tablet 1   losartan (COZAAR) 100 MG tablet TAKE 1 TABLET BY MOUTH EVERY MORNING 90 tablet 1   metoprolol succinate (TOPROL-XL) 25 MG 24 hr tablet TOME UNA TABLETA POR VIA ORAL TODOS LOS DIAS 30 tablet 2   mirtazapine (REMERON) 30 MG tablet Take 1 tablet (30 mg total) by mouth at bedtime. 30 tablet 6  moxifloxacin (VIGAMOX) 0.5 % ophthalmic solution Place 1 drop into the right eye 3 (three) times daily.     pantoprazole (PROTONIX) 40 MG tablet TOMAR UNA (1) TABLETA POR VIA ORAL UNA VEZ AL DIA 30 tablet 2   rosuvastatin (CRESTOR) 10 MG tablet TOMAR 1 TABLETA POR VIA ORAL UNA VEZ AL DIA 30 tablet 2   solifenacin (VESICARE) 10 MG tablet TOME UNA TABLETA TODOS LOS DIAS 90 tablet 3   traMADol (ULTRAM) 50 MG tablet Take 1 tablet (50 mg total) by mouth every 12 (twelve) hours as needed. (Patient not taking: Reported on 11/10/2023) 60 tablet 4     Allergies as of 11/11/2023   (No Known Allergies)    Social History   Socioeconomic History   Marital status: Single    Spouse name: Not on file   Number of children: 4   Years of education: Not on file   Highest education level: Not on file  Occupational History   Not on file  Tobacco Use   Smoking status: Former    Current packs/day: 0.00    Types: Cigarettes    Quit date: 33    Years since quitting: 44.2   Smokeless tobacco: Never  Vaping Use   Vaping status: Never Used  Substance and Sexual Activity   Alcohol use: No   Drug use: No   Sexual activity: Not Currently  Other Topics Concern   Not on file  Social History Narrative   Not on file   Social Drivers of Health   Financial Resource Strain: Low Risk  (03/15/2021)   Overall Financial Resource Strain (CARDIA)    Difficulty of Paying Living Expenses: Not very hard  Food Insecurity: Not on file  Transportation Needs: Not on file  Physical Activity: Not on file  Stress: Not on file  Social Connections: Not on file  Intimate Partner Violence: Not on file     Code Status   Code Status: Full Code  Review of Systems: All systems reviewed and negative except where noted in HPI.  Physical Exam: Vital signs in last 24 hours: Temp:  [97.4 F (36.3 C)-99.2 F (37.3 C)] 99.2 F (37.3 C) (04/02 0858) Pulse Rate:  [81-96] 95 (04/02 0858) Resp:  [16-23] 18 (04/02 0858) BP: (75-153)/(49-81) 114/68 (04/02 0858) SpO2:  [91 %-100 %] 98 % (04/02 0858) Weight:  [57.2 kg] 57.2 kg (04/01 2106)    General:   female in NAD Psych:  Cooperative.  Eyes: Pupils equal Ears:  Normal auditory acuity Nose: No deformity, discharge or lesions Neck:  Supple, no masses felt Lungs:  Clear to auscultation.  Heart:  Regular rate, regular rhythm.  Abdomen:  Soft, nondistended, generalized upper abdominal tenderness,  active bowel sounds, no masses felt Rectal :  Deferred Msk: Symmetrical without gross deformities.   Neurologic:  Alert, oriented, grossly normal neurologically Extremities : No edema Skin:  Intact without significant lesions.    Intake/Output from previous day: 04/01 0701 - 04/02 0700 In: 2200 [IV Piggyback:2200] Out: -  Intake/Output this shift:  No intake/output data recorded.   Willette Cluster, NP-C   11/12/2023, 10:56 AM

## 2023-11-12 NOTE — ED Notes (Signed)
 MD aware of repeat lactic 4.5

## 2023-11-13 DIAGNOSIS — R1011 Right upper quadrant pain: Secondary | ICD-10-CM

## 2023-11-13 DIAGNOSIS — R933 Abnormal findings on diagnostic imaging of other parts of digestive tract: Secondary | ICD-10-CM | POA: Diagnosis not present

## 2023-11-13 DIAGNOSIS — D72829 Elevated white blood cell count, unspecified: Secondary | ICD-10-CM | POA: Diagnosis not present

## 2023-11-13 DIAGNOSIS — K859 Acute pancreatitis without necrosis or infection, unspecified: Secondary | ICD-10-CM | POA: Diagnosis not present

## 2023-11-13 DIAGNOSIS — R630 Anorexia: Secondary | ICD-10-CM | POA: Diagnosis not present

## 2023-11-13 LAB — COMPREHENSIVE METABOLIC PANEL WITH GFR
ALT: 12 U/L (ref 0–44)
AST: 19 U/L (ref 15–41)
Albumin: 2 g/dL — ABNORMAL LOW (ref 3.5–5.0)
Alkaline Phosphatase: 85 U/L (ref 38–126)
Anion gap: 9 (ref 5–15)
BUN: 23 mg/dL (ref 8–23)
CO2: 22 mmol/L (ref 22–32)
Calcium: 7.9 mg/dL — ABNORMAL LOW (ref 8.9–10.3)
Chloride: 105 mmol/L (ref 98–111)
Creatinine, Ser: 1.16 mg/dL — ABNORMAL HIGH (ref 0.44–1.00)
GFR, Estimated: 46 mL/min — ABNORMAL LOW (ref >60)
Glucose, Bld: 68 mg/dL — ABNORMAL LOW (ref 70–99)
Potassium: 3.8 mmol/L (ref 3.5–5.1)
Sodium: 136 mmol/L (ref 135–145)
Total Bilirubin: 1.5 mg/dL — ABNORMAL HIGH (ref 0.0–1.2)
Total Protein: 4.7 g/dL — ABNORMAL LOW (ref 6.5–8.1)

## 2023-11-13 LAB — CBC
HCT: 35.9 % — ABNORMAL LOW (ref 36.0–46.0)
Hemoglobin: 12 g/dL (ref 12.0–15.0)
MCH: 29.7 pg (ref 26.0–34.0)
MCHC: 33.4 g/dL (ref 30.0–36.0)
MCV: 88.9 fL (ref 80.0–100.0)
Platelets: 125 10*3/uL — ABNORMAL LOW (ref 150–400)
RBC: 4.04 MIL/uL (ref 3.87–5.11)
RDW: 16 % — ABNORMAL HIGH (ref 11.5–15.5)
WBC: 14.4 10*3/uL — ABNORMAL HIGH (ref 4.0–10.5)
nRBC: 0 % (ref 0.0–0.2)

## 2023-11-13 LAB — HEMOGLOBIN A1C
Hgb A1c MFr Bld: 5.7 % — ABNORMAL HIGH (ref 4.8–5.6)
Mean Plasma Glucose: 116.89 mg/dL

## 2023-11-13 LAB — LIPASE, BLOOD: Lipase: 53 U/L — ABNORMAL HIGH (ref 11–51)

## 2023-11-13 LAB — MAGNESIUM: Magnesium: 2.3 mg/dL (ref 1.7–2.4)

## 2023-11-13 MED ORDER — ATROPINE SULFATE 1 % OP SOLN
1.0000 [drp] | Freq: Four times a day (QID) | OPHTHALMIC | Status: DC
  Administered 2023-11-13: 1 [drp] via OPHTHALMIC
  Filled 2023-11-13: qty 2

## 2023-11-13 MED ORDER — ATROPINE SULFATE 1 % OP SOLN
1.0000 [drp] | Freq: Two times a day (BID) | OPHTHALMIC | Status: DC
  Administered 2023-11-13 – 2023-11-15 (×4): 1 [drp] via OPHTHALMIC
  Filled 2023-11-13: qty 2

## 2023-11-13 MED ORDER — LACTATED RINGERS IV SOLN: INTRAVENOUS | Status: AC

## 2023-11-13 MED ORDER — MELATONIN 5 MG PO TABS
5.0000 mg | ORAL_TABLET | Freq: Every day | ORAL | Status: DC
  Administered 2023-11-13 – 2023-11-15 (×3): 5 mg via ORAL
  Filled 2023-11-13 (×3): qty 1

## 2023-11-13 MED ORDER — ERYTHROMYCIN 5 MG/GM OP OINT
1.0000 | TOPICAL_OINTMENT | Freq: Once | OPHTHALMIC | Status: AC
  Administered 2023-11-13: 1 via OPHTHALMIC
  Filled 2023-11-13: qty 1

## 2023-11-13 NOTE — Hospital Course (Signed)
 83 year old female with a history of hypertension, hyperlipidemia, diabetes who presented with poor p.o. intake and complaints of upper abdominal discomfort.  In the emergency department, patient was found to be hypotensive with an elevated lactic acid.  Patient was noted to have acute pancreatitis on CT abdomen pelvis with an 8 mm hypodense pancreatic neck lesion seen.  Patient was continued on IV fluid hydration and gastroenterology was consulted

## 2023-11-13 NOTE — Progress Notes (Signed)
   11/13/23 1354  TOC Brief Assessment  Insurance and Status Reviewed  Patient has primary care physician Yes  Home environment has been reviewed family  Prior level of function: independent  Prior/Current Home Services No current home services  Social Drivers of Health Review SDOH reviewed no interventions necessary  Readmission risk has been reviewed No  Transition of care needs no transition of care needs at this time   Per notes : If she doesn't start taking PO soon will need to consider enteral feedings.      Transition of Care Department [facility]) has reviewed patient and no TOC needs have been identified at this time. We will continue to monitor patient advancement through interdisciplinary progression rounds. If new patient transition needs arise, please place a TOC consult.

## 2023-11-13 NOTE — Progress Notes (Signed)
 Daily Progress Note  DOA: 11/11/2023 Hospital Day: 3   Chief Complaint: acute pancreatiits  ASSESSMENT    83 y.o.year old Spanish speaking female admitted with  Acute Pancreatitis Lactic acidosis Leukocytosis Cause of acute pancreatitis unknown (See "GI studies" below). Possibly medication related. Biliary unlikely. Consider underlying pancreatic neoplasm given the small pancreatic lesion on CT scan.   Today:  Lipase 53.Marland Kitchen Leukocytosis (probably reactive) is improving. Lactic acid level has normalized. HCT ~ 40. Creatinine down.    Dementia, recently diagnosed per daughter PCP recently stopped Protonix  Diminished appetite.  No PO intake except sips with medication since Monday. Family cannot get clear answer from patient about why she isn't eating but possibly due to abdominal discomfort related to pancreatitis and / or dementia.    History of severe esophagitis / candida HIstory of chronic gastritis with focal antral metaplasia   See PMH for additional history   PLAN   --Continue maintenance IVF --If she doesn't start taking PO soon will need to consider enteral feedings.  --Follow up MRI / MRCP at some point to evaluate pancreas / biliary tree  Subjective   Daughter and Granddaughter in the room. No abdominal pain today but still not taking any PO.    Objective   GI Studies:   Trig 43  Ca+ 8.8  US Abdomen Limited RUQ (LIVER/GB) CLINICAL DATA:  Pancreatitis FINDINGS Gallbladder is mildly distended. No wall thickening, shadowing stones. Common bile duct: Diameter: Fall mm Liver: No focal lesion identified. Within normal limits in parenchymal echogenicity. Portal vein is patent on color Doppler imaging with normal direction of blood flow towards the liver. Other: Evaluation limited by overlapping bowel gas and soft tissue IMPRESSION: No gallstones or ductal dilatation  Electronically Signed   By: Karen Kays M.D.   On: 11/12/2023 17:50  CTAP w  contrast  11/11/23 Haziness around the pancreas may reflect inflammation. Recommend clinical correlation for acute pancreatitis.   8 mm low-density lesion in the pancreatic neck. This could befollowed with repeat CT in 6 months.   Aortic atherosclerosis.   Small amount of free fluid in the pelvis.   Moderate stool burden in the right and transverse colon.   Recent Labs    11/11/23 2114 11/11/23 2219 11/12/23 0502 11/13/23 0653  WBC 19.2*  --  19.1* 14.4*  HGB 13.8 14.6 13.4 12.0  HCT 42.6 43.0 41.1 35.9*  MCV 93.6  --  91.3 88.9  PLT 180  --  145* 125*   No results for input(s): "FOLATE", "VITAMINB12", "FERRITIN", "TIBC", "IRONPCTSAT" in the last 72 hours. Recent Labs    11/11/23 2114 11/11/23 2219 11/12/23 0502 11/13/23 0653  NA 137 139 137 136  K 4.1 4.2 4.3 3.8  CL 108 107 106 105  CO2 16*  --  21* 22  GLUCOSE 123* 103* 79 68*  BUN 28* 34* 25* 23  CREATININE 1.45* 1.40* 1.18* 1.16*  CALCIUM 8.6*  --  8.4* 7.9*   Recent Labs    11/11/23 2114 11/12/23 0502 11/13/23 0653  PROT 5.9* 5.0* 4.7*  ALBUMIN 2.8* 2.5* 2.0*  AST 30 26 19   ALT 13 12 12   ALKPHOS 100 82 85  BILITOT 1.6* 1.8* 1.5*  BILIDIR  --  0.5*  --   IBILI  --  1.3*  --    Recent Labs    11/11/23 2114  INR 1.2     Scheduled inpatient medications:   heparin  5,000 Units Subcutaneous Q8H  melatonin  5 mg Oral QHS   mirtazapine  30 mg Oral QHS   pantoprazole  40 mg Oral Daily   rosuvastatin  10 mg Oral Daily   Continuous inpatient infusions:   lactated ringers 75 mL/hr at 11/13/23 1145   PRN inpatient medications: acetaminophen, haloperidol lactate, HYDROmorphone (DILAUDID) injection, ondansetron (ZOFRAN) IV, oxyCODONE **OR** oxyCODONE, polyethylene glycol  Vital signs in last 24 hours: Temp:  [98.3 F (36.8 C)-99.5 F (37.5 C)] 98.7 F (37.1 C) (04/03 0834) Pulse Rate:  [83-88] 88 (04/03 0834) Resp:  [16-17] 17 (04/03 0834) BP: (108-126)/(50-90) 108/50 (04/03 0834) SpO2:  [97 %] 97  % (04/03 0834) Last BM Date : 11/09/23  Intake/Output Summary (Last 24 hours) at 11/13/2023 1238 Last data filed at 11/13/2023 0849 Gross per 24 hour  Intake 2434.66 ml  Output --  Net 2434.66 ml    Intake/Output from previous day: 04/02 0701 - 04/03 0700 In: 2434.7 [P.O.:240; I.V.:2194.7] Out: -  Intake/Output this shift: No intake/output data recorded.   Physical Exam:  General: Alert female in NAD Heart:  Regular rate and rhythm.  Pulmonary: Normal respiratory effort. Left lung crackles. Limited exam due to her limited participation Abdomen: Soft, nondistended, moderate mid upper and LUQ tenderness.  Normal bowel sounds. Neurologic: Alert and oriented Psych: Pleasant. Cooperative     LOS: 1 day   Willette Cluster ,NP 11/13/2023, 12:38 PM

## 2023-11-13 NOTE — Progress Notes (Signed)
  Progress Note   Patient: Claudia [name] OZH:[id number] DOB: 11/26/1940 DOA: 11/11/2023     1 DOS: the patient was seen and examined on 11/13/2023   Brief hospital course: 83 year old female with a history of hypertension, hyperlipidemia, diabetes who presented with poor p.o. intake and complaints of upper abdominal discomfort.  In the emergency department, patient was found to be hypotensive with an elevated lactic acid.  Patient was noted to have acute pancreatitis on CT abdomen pelvis with an 8 mm hypodense pancreatic neck lesion seen.  Patient was continued on IV fluid hydration and gastroenterology was consulted   Assessment and Plan: Acute pancreatitis with 8 mm hypodense pancreatic neck lesion -Gastroenterology is consulted and following -RUQ reviewed, neg for stones -Per GI, will need MRCP or EUS at some point once she recovers from this acute pancreatitis to r/o small malignancy -Continue hydration as tolerated   Presenting hypotension with history of hypertension -Continue basal fluids as tolerated -Blood pressure currently stable and well-controlled   Lactic acidosis -Continued on basal IV fluids -Follow serial lactic acid levels   Leukocytosis -Urinalysis unremarkable, chest x-ray reviewed, unremarkable -Blood cultures pending, thus far negative -Follow WBC trends   Recent corneal ulcer -Continue atropine drops, moxifloxacin drops, erythromycin ointment per home regimen   History of dementia -Seems stable at this time -Continue supportive care   Hyperlipidemia -Continued on Crestor   Diabetes -Will continue patient on sliding scale insulin as needed -Hgb A1c 5.7   Subjective: Sleepy this AM. Per family, pt's nights and days seem to have reversed. Historically did better with melatonin  Physical Exam: Vitals:   11/12/23 1144 11/12/23 1548 11/12/23 2035 11/13/23 0834  BP: 107/70 (!) 111/90 (!) 126/56 (!) 108/50  Pulse: 94 88 83 88  Resp: 16 16 17 17   Temp: 98.4  F (36.9 C) 98.3 F (36.8 C) 99.5 F (37.5 C) 98.7 F (37.1 C)  TempSrc: Oral Oral Oral Oral  SpO2: 97% 97% 97% 97%  Weight:      Height:       General exam: Awake, laying in bed, in nad Respiratory system: Normal respiratory effort, no wheezing Cardiovascular system: regular rate, s1, s2 Gastrointestinal system: Soft, nondistended, positive BS Central nervous system: CN2-12 grossly intact, strength intact Extremities: Perfused, no clubbing Skin: Normal skin turgor, no notable skin lesions seen Psychiatry: Mood normal // no visual hallucinations   Data Reviewed:  Labs reviewed: Na 136, K 3.8, Cr 1.16, lipase 53  Family Communication: Pt in room, family at bedside  Disposition: Status is: Inpatient Remains inpatient appropriate because: severity of illness  Planned Discharge Destination: Home    Author: Rickey Barbara, MD 11/13/2023 3:59 PM  For on call review [email or web address].

## 2023-11-14 DIAGNOSIS — R1011 Right upper quadrant pain: Secondary | ICD-10-CM | POA: Diagnosis not present

## 2023-11-14 DIAGNOSIS — R933 Abnormal findings on diagnostic imaging of other parts of digestive tract: Secondary | ICD-10-CM | POA: Diagnosis not present

## 2023-11-14 DIAGNOSIS — K859 Acute pancreatitis without necrosis or infection, unspecified: Secondary | ICD-10-CM | POA: Diagnosis not present

## 2023-11-14 LAB — COMPREHENSIVE METABOLIC PANEL WITH GFR
ALT: 25 U/L (ref 0–44)
AST: 50 U/L — ABNORMAL HIGH (ref 15–41)
Albumin: 1.9 g/dL — ABNORMAL LOW (ref 3.5–5.0)
Alkaline Phosphatase: 77 U/L (ref 38–126)
Anion gap: 10 (ref 5–15)
BUN: 15 mg/dL (ref 8–23)
CO2: 22 mmol/L (ref 22–32)
Calcium: 7.7 mg/dL — ABNORMAL LOW (ref 8.9–10.3)
Chloride: 104 mmol/L (ref 98–111)
Creatinine, Ser: 0.92 mg/dL (ref 0.44–1.00)
GFR, Estimated: >60 mL/min (ref >60)
Glucose, Bld: 57 mg/dL — ABNORMAL LOW (ref 70–99)
Potassium: 3.8 mmol/L (ref 3.5–5.1)
Sodium: 136 mmol/L (ref 135–145)
Total Bilirubin: 1.4 mg/dL — ABNORMAL HIGH (ref 0.0–1.2)
Total Protein: 4.7 g/dL — ABNORMAL LOW (ref 6.5–8.1)

## 2023-11-14 LAB — CBC
HCT: 35.6 % — ABNORMAL LOW (ref 36.0–46.0)
Hemoglobin: 11.7 g/dL — ABNORMAL LOW (ref 12.0–15.0)
MCH: 29.8 pg (ref 26.0–34.0)
MCHC: 32.9 g/dL (ref 30.0–36.0)
MCV: 90.6 fL (ref 80.0–100.0)
Platelets: 129 10*3/uL — ABNORMAL LOW (ref 150–400)
RBC: 3.93 MIL/uL (ref 3.87–5.11)
RDW: 15.6 % — ABNORMAL HIGH (ref 11.5–15.5)
WBC: 11.6 10*3/uL — ABNORMAL HIGH (ref 4.0–10.5)
nRBC: 0 % (ref 0.0–0.2)

## 2023-11-14 MED ORDER — TRAZODONE HCL 50 MG PO TABS
25.0000 mg | ORAL_TABLET | Freq: Once | ORAL | Status: AC
  Administered 2023-11-14: 25 mg via ORAL
  Filled 2023-11-14: qty 1

## 2023-11-14 MED ORDER — PROSOURCE PLUS PO LIQD
30.0000 mL | Freq: Two times a day (BID) | ORAL | Status: DC
  Administered 2023-11-14: 30 mL via ORAL
  Filled 2023-11-14 (×2): qty 30

## 2023-11-14 MED ORDER — THIAMINE MONONITRATE 100 MG PO TABS
100.0000 mg | ORAL_TABLET | Freq: Every day | ORAL | Status: DC
  Administered 2023-11-14: 100 mg via ORAL
  Filled 2023-11-14 (×2): qty 1

## 2023-11-14 MED ORDER — BOOST / RESOURCE BREEZE PO LIQD CUSTOM
1.0000 | Freq: Three times a day (TID) | ORAL | Status: DC
  Administered 2023-11-14 – 2023-11-15 (×4): 1 via ORAL

## 2023-11-14 MED ORDER — ADULT MULTIVITAMIN W/MINERALS CH
1.0000 | ORAL_TABLET | Freq: Every day | ORAL | Status: DC
  Administered 2023-11-14: 1 via ORAL
  Filled 2023-11-14 (×2): qty 1

## 2023-11-14 NOTE — Progress Notes (Signed)
      Progress Note   Subjective  Patient doing better today. Drinking ensure, ate a banana. Not having much pain.    Objective   Vital signs in last 24 hours: Temp:  [98.1 F (36.7 C)-98.7 F (37.1 C)] 98.7 F (37.1 C) (04/04 0852) Pulse Rate:  [87-91] 87 (04/04 0852) Resp:  [16-17] 17 (04/04 0852) BP: (138-143)/(67-73) 143/73 (04/04 0852) SpO2:  [99 %] 99 % (04/04 0852) Last BM Date : 11/09/23 General:    hispanic female in NAD Neurologic:  Alert and oriented,  grossly normal neurologically. Psych:  Cooperative. Normal mood and affect.  Intake/Output from previous day: 04/03 0701 - 04/04 0700 In: 366.9 [P.O.:120; I.V.:246.9] Out: -  Intake/Output this shift: Total I/O In: 120 [P.O.:120] Out: -   Lab Results: Recent Labs    11/12/23 0502 11/13/23 0653 11/14/23 0902  WBC 19.1* 14.4* 11.6*  HGB 13.4 12.0 11.7*  HCT 41.1 35.9* 35.6*  PLT 145* 125* 129*   BMET Recent Labs    11/12/23 0502 11/13/23 0653 11/14/23 0902  NA 137 136 136  K 4.3 3.8 3.8  CL 106 105 104  CO2 21* 22 22  GLUCOSE 79 68* 57*  BUN 25* 23 15  CREATININE 1.18* 1.16* 0.92  CALCIUM 8.4* 7.9* 7.7*   LFT Recent Labs    11/12/23 0502 11/13/23 0653 11/14/23 0902  PROT 5.0*   < > 4.7*  ALBUMIN 2.5*   < > 1.9*  AST 26   < > 50*  ALT 12   < > 25  ALKPHOS 82   < > 77  BILITOT 1.8*   < > 1.4*  BILIDIR 0.5*  --   --   IBILI 1.3*  --   --    < > = values in this interval not displayed.   PT/INR Recent Labs    11/11/23 2114  LABPROT 15.8*  INR 1.2    Studies/Results: US Abdomen Limited RUQ (LIVER/GB) Result Date: 11/12/2023 CLINICAL DATA:  Pancreatitis EXAM: ULTRASOUND ABDOMEN LIMITED RIGHT UPPER QUADRANT COMPARISON:  CT 11/11/2023 FINDINGS: Gallbladder: Gallbladder is mildly distended. No wall thickening, shadowing stones. Common bile duct: Diameter: Fall mm Liver: No focal lesion identified. Within normal limits in parenchymal echogenicity. Portal vein is patent on color Doppler  imaging with normal direction of blood flow towards the liver. Other: Evaluation limited by overlapping bowel gas and soft tissue IMPRESSION: No gallstones or ductal dilatation Electronically Signed   By: Karen Kays M.D.   On: 11/12/2023 17:50       Assessment / Plan:    83 y/o female here with the following:  Pancreatitis Abnormal CT imaging of the pancreas  First time pancreatitis. Workup negative for etiology other than there being a small lesion noted in the pancreatic neck on CT. No new meds otherwise to cause this that they endorse.   Her labs continue to improve, her pain is improved, tolerating some PO today. I do not think she needs a feeding tube at this point, continue oral intake, low fat diet.  Continue supportive measures until ready for discharge, hopefully in next 1-2 days. Once outpatient we will coordinate a follow up MRCP in the upcoming weeks, ensure no mass lesion of the pancreas.   Please call with questions, we will sign off for now.   Harlin Rain, MD [facility]

## 2023-11-14 NOTE — TOC Initial Note (Addendum)
 Transition of Care (TOC) - Initial/Assessment Note   Spoke to patient's daughter Jazara Swiney at bedside.   Patient is from home with her son , his address is [address] [place], [place] [address] . However patient has been going back and forth between son's address and Danijela's address which is [address] , [place] [address] .   At discharge [facility] plans to take her to her Kamie Korber) address.   Recommendations for HHPT/OT , Presleigh in agreement and no preference in agency.   PT also recommending bedside commode and hospital bed and transport chair. Derionna would like to know cost of each. She has no preference in DME agency. NCM will enter orders and progress note and ask MD to sign. Called  Jermaine with [employer]  to call Larine with cost.   Anticipated discharge date is early next week , MD monitoring patient's PO intake. MD mentioned possible tube feeds  Will need to know  if tube feeds needed prior to arranging home health . TOC will continue to monitor    Patient Details  Name: Claudia [name] MRN: [id number] Date of Birth: [birth year]  Transition of Care [facility]) CM/SW Contact:    Kingsley Plan, RN Phone Number: 11/14/2023, 12:59 PM  Clinical Narrative:                   Expected Discharge Plan: Home w Home Health Services Barriers to Discharge: Continued Medical Work up   Patient Goals and CMS Choice   CMS Medicare.gov Compare Post Acute Care list provided to:: Patient Represenative (must comment) (daughter Olga Bourbeau) Choice offered to / list presented to : Adult Children      Expected Discharge Plan and Services   Discharge Planning Services: CM Consult Post Acute Care Choice: Durable Medical Equipment, Home Health Living arrangements for the past 2 months: Single Family Home                 DME Arranged: Hospital bed, Bedside commode         HH Arranged: PT, OT HH Agency:  (see note)        Prior Living Arrangements/Services Living arrangements for the  past 2 months: Single Family Home Lives with:: Adult Children Patient language and need for interpreter reviewed:: Yes        Need for Family Participation in Patient Care: Yes (Comment) Care giver support system in place?: Yes (comment) Current home services: DME (has walker) Criminal Activity/Legal Involvement Pertinent to Current Situation/Hospitalization: No - Comment as needed  Activities of Daily Living   ADL Screening (condition at time of admission) Independently performs ADLs?: No Does the patient have a NEW difficulty with bathing/dressing/toileting/self-feeding that is expected to last >3 days?: Yes (Initiates electronic notice to provider for possible OT consult) Does the patient have a NEW difficulty with getting in/out of bed, walking, or climbing stairs that is expected to last >3 days?: Yes (Initiates electronic notice to provider for possible PT consult) Does the patient have a NEW difficulty with communication that is expected to last >3 days?: No Is the patient deaf or have difficulty hearing?: No Does the patient have difficulty seeing, even when wearing glasses/contacts?: No Does the patient have difficulty concentrating, remembering, or making decisions?: Yes  Permission Sought/Granted   Permission granted to share information with : Yes, Verbal Permission Granted  Share Information with NAME: daughter Liza           Emotional Assessment Appearance:: Appears stated age  Alcohol / Substance Use: Not Applicable Psych Involvement: No (comment)  Admission diagnosis:  Right upper quadrant abdominal pain [R10.11] Pancreatitis, unspecified pancreatitis type [K85.90] Patient Active Problem List   Diagnosis Date Noted   Pancreatitis, unspecified pancreatitis type 11/12/2023   Hypotension due to hypovolemia 11/12/2023   Lactic acidosis 11/12/2023   Leukocytosis 11/12/2023   Abnormal finding on GI tract imaging 11/12/2023   Anemia due to chronic blood  loss 09/27/2021   Constipation 09/27/2021   B12 deficiency 01/11/2020   Hypertension    Hyperlipidemia    Diabetes mellitus without complication (HCC)    GERD (gastroesophageal reflux disease)    PCP:  Donita Brooks, MD Pharmacy:   [facility], [place] - [address] [place] [address] Phone: [phone] Fax: [phone]     Social Drivers of Health (SDOH) Social History: SDOH Screenings   Food Insecurity: No Food Insecurity (11/12/2023)  Housing: Low Risk  (11/12/2023)  Transportation Needs: No Transportation Needs (11/12/2023)  Utilities: Not At Risk (11/12/2023)  Depression (PHQ2-9): Low Risk  (01/11/2020)  Financial Resource Strain: Low Risk  (03/15/2021)  Social Connections: Moderately Isolated (11/12/2023)  Tobacco Use: Medium Risk (11/11/2023)   SDOH Interventions:     Readmission Risk Interventions     No data to display

## 2023-11-14 NOTE — Evaluation (Signed)
 Physical Therapy Evaluation Patient Details Name: Claudia [name] MRN: [id number] DOB: Sep 11, 1940 Today's Date: 11/14/2023  History of Present Illness  Pt is a 83 y/o female presents on 4/1 due to presyncopal episode. Found hypotensive likely hypovolemic in setting of decreased po intake related to acute pancreatitis. PMH includes: anemia, blind R eye, DM, HTN, hernia repair, dementia, failure to thrive.  Clinical Impression   Pt presents with generalized weakness, impaired balance at risk for falls, and decreased activity tolerance vs baseline. Pt to benefit from acute PT to address deficits. Pt requiring significant assist to mobilize to EOB, however PT feels pt requiring more assist today given irritability. Pt lives at home with her daughter, would benefit from [facility] services for home assessment, minimizing fall risk. PT to progress mobility as tolerated, and will continue to follow acutely.          If plan is discharge home, recommend the following: A lot of help with walking and/or transfers;A lot of help with bathing/dressing/bathroom   Can travel by private vehicle        Equipment Recommendations BSC/3in1;Other (comment);Hospital bed (transport chair)  Recommendations for Other Services       Functional Status Assessment Patient has had a recent decline in their functional status and demonstrates the ability to make significant improvements in function in a reasonable and predictable amount of time.     Precautions / Restrictions Precautions Precautions: Fall Restrictions Weight Bearing Restrictions Per Provider Order: No      Mobility  Bed Mobility Overal bed mobility: Needs Assistance Bed Mobility: Supine to Sit, Sit to Supine     Supine to sit: Max assist, +2 for physical assistance Sit to supine: Supervision, HOB elevated, Used rails   General bed mobility comments: max +2 for translation to EOB for LE and trunk management, pt resistant to mobility making assist  level higher. supervision for return to supine for increased time, but no physical assist    Transfers                   General transfer comment: unable to assess given pt not amenable to this    Ambulation/Gait                  Stairs            Wheelchair Mobility     Tilt Bed    Modified Rankin (Stroke Patients Only)       Balance Overall balance assessment: Needs assistance Sitting-balance support: No upper extremity supported, Feet supported Sitting balance-Leahy Scale: Fair         Standing balance comment: unable to assess                             Pertinent Vitals/Pain Pain Assessment Pain Assessment: Faces Faces Pain Scale: Hurts a little bit Pain Location: generalized during mobility Pain Descriptors / Indicators: Grimacing, Guarding Pain Intervention(s): Limited activity within patient's tolerance, Monitored during session, Repositioned    Home Living Family/patient expects to be discharged to:: (P) Private residence Living Arrangements: (P) Children Available Help at Discharge: (P) Family;Available 24 hours/day Type of Home: (P) House Home Access: (P) Stairs to enter Entrance Stairs-Rails: (P) Left;Right Entrance Stairs-Number of Steps: (P) 3   Home Layout: (P) One level Home Equipment: (P) Rollator (4 wheels);Cane - single point      Prior Function Prior Level of Function : (P) Needs assist  Mobility Comments: (P) assists with household mobility ADLs Comments: (P) assist for all meal prep, dressing, bathing. Pt can feed herself. Daugther reports 3 weeks ago she was able to dress and bathe herself.     Extremity/Trunk Assessment   Upper Extremity Assessment Upper Extremity Assessment: Defer to OT evaluation    Lower Extremity Assessment Lower Extremity Assessment: Generalized weakness;Difficult to assess due to impaired cognition    Cervical / Trunk Assessment Cervical / Trunk  Assessment: Normal  Communication        Cognition Arousal: Alert Behavior During Therapy: WFL for tasks assessed/performed   PT - Cognitive impairments: History of cognitive impairments                       PT - Cognition Comments: history of dementia, irritable verging on agitated with mobility Following commands: Impaired Following commands impaired: Follows one step commands inconsistently     Cueing       General Comments      Exercises     Assessment/Plan    PT Assessment Patient needs continued PT services  PT Problem List Decreased strength;Decreased mobility;Decreased activity tolerance;Decreased balance;Decreased knowledge of use of DME;Decreased cognition;Decreased safety awareness       PT Treatment Interventions DME instruction;Therapeutic activities;Gait training;Therapeutic exercise;Patient/family education;Balance training;Stair training;Functional mobility training;Neuromuscular re-education    PT Goals (Current goals can be found in the Care Plan section)  Acute Rehab PT Goals PT Goal Formulation: With patient Time For Goal Achievement: 11/28/23 Potential to Achieve Goals: Good    Frequency Min 2X/week     Co-evaluation               AM-PAC PT "6 Clicks" Mobility  Outcome Measure Help needed turning from your back to your side while in a flat bed without using bedrails?: A Little Help needed moving from lying on your back to sitting on the side of a flat bed without using bedrails?: A Lot Help needed moving to and from a bed to a chair (including a wheelchair)?: Total Help needed standing up from a chair using your arms (e.g., wheelchair or bedside chair)?: Total Help needed to walk in hospital room?: Total Help needed climbing 3-5 steps with a railing? : Total 6 Click Score: 9    End of Session   Activity Tolerance: Patient limited by fatigue;Treatment limited secondary to agitation Patient left: in bed;with call bell/phone  within reach;with bed alarm set;with family/visitor present;Other (comment) (OT at bedside) Nurse Communication: Mobility status PT Visit Diagnosis: Other abnormalities of gait and mobility (R26.89);Muscle weakness (generalized) (M62.81)    Time: 8657-8469 PT Time Calculation (min) (ACUTE ONLY): 27 min   Charges:   PT Evaluation $PT Eval Low Complexity: 1 Low   PT General Charges $$ ACUTE PT VISIT: 1 Visit         Marye Round, PT DPT Acute Rehabilitation Services Secure Chat Preferred  Office [phone]   Juanitta Earnhardt E Christain [place] 11/14/2023, 11:47 AM

## 2023-11-14 NOTE — Evaluation (Signed)
 Occupational Therapy Evaluation Patient Details Name: Claudia [name] MRN: [id number] DOB: 08-Aug-1941 Today's Date: 11/14/2023   History of Present Illness   Pt is a 83 y/o female presents on 4/1 due to presyncopal episode. Found hypotensive likely hypovolemic in setting of decreased po intake related to acute pancreatitis. PMH includes: anemia, blind R eye, DM, HTN, hernia repair, dementia, failure to thrive.     Clinical Impressions Pt admitted for above and presents with problem list below.  She has good support at home, daughter providing PLOF and home setup; as well as translating per preference. Patient requires max assist +2 to transition to EOB today but returns back to bed with supervision.  Pt washing face with setup given encouragement.  Pt limiting engagement in session due to irritability. Based on performance today, pt will best benefit from continued OT services acutely and after dc at [facility] level to decrease burden of care, optimize safety and provide home assessment.      If plan is discharge home, recommend the following:   A lot of help with bathing/dressing/bathroom;A lot of help with walking and/or transfers;Assistance with cooking/housework;Help with stairs or ramp for entrance;Supervision due to cognitive status;Assist for transportation     Functional Status Assessment   Patient has had a recent decline in their functional status and demonstrates the ability to make significant improvements in function in a reasonable and predictable amount of time.     Equipment Recommendations   BSC/3in1     Recommendations for Other Services         Precautions/Restrictions   Precautions Precautions: Fall Recall of Precautions/Restrictions: Impaired Restrictions Weight Bearing Restrictions Per Provider Order: No     Mobility Bed Mobility Overal bed mobility: Needs Assistance Bed Mobility: Supine to Sit, Sit to Supine     Supine to sit: Max assist, +2 for  physical assistance Sit to supine: Supervision, Used rails, HOB elevated   General bed mobility comments: max +2 to EOB for LE and trunk management, pt resistant to mobility making assist level higher. supervision for return to supine for increased time, but no physical assist    Transfers                   General transfer comment: unable      Balance Overall balance assessment: Needs assistance Sitting-balance support: No upper extremity supported, Feet supported Sitting balance-Leahy Scale: Fair                                     ADL either performed or assessed with clinical judgement   ADL Overall ADL's : Needs assistance/impaired     Grooming: Set up;Wash/dry face;Bed level           Upper Body Dressing : Maximal assistance;Bed level   Lower Body Dressing: Total assistance;Bed level     Toilet Transfer Details (indicate cue type and reason): deferred         Functional mobility during ADLs: Maximal assistance;+2 for physical assistance       Vision   Additional Comments: not formally tested, per chart R eye blind     Perception         Praxis         Pertinent Vitals/Pain Pain Assessment Pain Assessment: Faces Faces Pain Scale: Hurts a little bit Pain Location: generalized Pain Descriptors / Indicators: Discomfort Pain Intervention(s): Monitored during session, Repositioned  Extremity/Trunk Assessment Upper Extremity Assessment Upper Extremity Assessment: Defer to OT evaluation   Lower Extremity Assessment Lower Extremity Assessment: Generalized weakness;Difficult to assess due to impaired cognition   Cervical / Trunk Assessment Cervical / Trunk Assessment: Normal   Communication Communication Communication: Impaired Factors Affecting Communication:  (daughter intepreting)   Cognition Arousal: Alert Behavior During Therapy: Restless, Agitated Cognition: History of cognitive impairments, Difficult to  assess Difficult to assess due to: Non-English speaking           OT - Cognition Comments: daughter translating per preference. pt easily agitated and prefers not to engage in tasks.                 Following commands: Impaired Following commands impaired: Follows one step commands inconsistently     Cueing  General Comments   Cueing Techniques: Verbal cues;Visual cues  daugther at side and supportive   Exercises     Shoulder Instructions      Home Living Family/patient expects to be discharged to:: Private residence Living Arrangements: Children Available Help at Discharge: Family;Available 24 hours/day Type of Home: House Home Access: Stairs to enter [employer] of Steps: 3 Entrance Stairs-Rails: Left;Right Home Layout: One level     Bathroom Shower/Tub: Other (comment) (plans to do a bed bath once d/c)   Bathroom Toilet: Standard     Home Equipment: Rollator (4 wheels);Cane - single point          Prior Functioning/Environment Prior Level of Function : Needs assist             Mobility Comments: assists with household mobility ADLs Comments: assist for all meal prep, dressing, bathing. Pt can feed herself. Daugther reports 3 weeks ago she was able to dress and bathe herself.    OT Problem List: Decreased strength;Decreased activity tolerance;Decreased cognition;Decreased safety awareness;Decreased knowledge of use of DME or AE;Decreased knowledge of precautions   OT Treatment/Interventions: Self-care/ADL training;Therapeutic exercise;DME and/or AE instruction;Balance training;Therapeutic activities;Patient/family education      OT Goals(Current goals can be found in the care plan section)   Acute Rehab OT Goals Patient Stated Goal: none stated OT Goal Formulation: Patient unable to participate in goal setting Time For Goal Achievement: 11/28/23 Potential to Achieve Goals: Fair   OT Frequency:  Min 2X/week    Co-evaluation               AM-PAC OT "6 Clicks" Daily Activity     Outcome Measure Help from another person eating meals?: A Little Help from another person taking care of personal grooming?: A Lot Help from another person toileting, which includes using toliet, bedpan, or urinal?: Total Help from another person bathing (including washing, rinsing, drying)?: Total Help from another person to put on and taking off regular upper body clothing?: A Lot Help from another person to put on and taking off regular lower body clothing?: Total 6 Click Score: 10   End of Session Nurse Communication: Mobility status  Activity Tolerance: Treatment limited secondary to agitation Patient left: in bed;with call bell/phone within reach;with bed alarm set;with family/visitor present  OT Visit Diagnosis: Other abnormalities of gait and mobility (R26.89);Muscle weakness (generalized) (M62.81)                Time: 4098-1191 OT Time Calculation (min): 18 min Charges:  OT General Charges $OT Visit: 1 Visit OT Evaluation $OT Eval Moderate Complexity: 1 Mod  Barry Brunner, OT Acute Rehabilitation Services Office [phone] Secure Chat Preferred    [place]  S Jaonna Word 11/14/2023, 12:56 PM

## 2023-11-14 NOTE — Progress Notes (Signed)
  Progress Note   Patient: Claudia [name] MVH:[id number] DOB: May 08, 1941 DOA: 11/11/2023     2 DOS: the patient was seen and examined on 11/14/2023   Brief hospital course: 83 year old female with a history of hypertension, hyperlipidemia, diabetes who presented with poor p.o. intake and complaints of upper abdominal discomfort.  In the emergency department, patient was found to be hypotensive with an elevated lactic acid.  Patient was noted to have acute pancreatitis on CT abdomen pelvis with an 8 mm hypodense pancreatic neck lesion seen.  Patient was continued on IV fluid hydration and gastroenterology was consulted   Assessment and Plan: Acute pancreatitis with 8 mm hypodense pancreatic neck lesion -Gastroenterology is consulted and had been following -RUQ reviewed, neg for stones -Per GI, ok to slowly advance diet. Ultimately plan for MRCP as outpatient. [facility] to follow up as outpatient   Presenting hypotension with history of hypertension -Continue basal fluids as tolerated -Blood pressure currently stable and well-controlled   Lactic acidosis -Continued on basal IV fluids -Follow serial lactic acid levels   Leukocytosis -Urinalysis unremarkable, chest x-ray reviewed, unremarkable -Blood cultures pending, thus far negative -Follow WBC trends   Recent corneal ulcer -Continue atropine drops, moxifloxacin drops, erythromycin ointment per home regimen   History of dementia -Seems stable at this time -Continue supportive care   Hyperlipidemia -Continued on Crestor   Diabetes -Will continue patient on sliding scale insulin as needed -Hgb A1c 5.7   Subjective: Ultimately slept last nigth with multiple sleep aids. Pt was able to tolerate part of a bananna overnight without issues  Physical Exam: Vitals:   11/12/23 2035 11/13/23 0834 11/13/23 1724 11/14/23 0852  BP: (!) 126/56 (!) 108/50 138/67 (!) 143/73  Pulse: 83 88 91 87  Resp: 17 17 16 17   Temp:  98.7 F (37.1 C)  98.1 F (36.7 C) 98.7 F (37.1 C)  TempSrc: Oral Oral Oral Oral  SpO2: 97% 97% 99% 99%  Weight:      Height:       General exam: Conversant, in no acute distress Respiratory system: normal chest rise, clear, no audible wheezing Cardiovascular system: regular rhythm, s1-s2 Gastrointestinal system: Nondistended, nontender, pos BS Central nervous system: No seizures, no tremors Extremities: No cyanosis, no joint deformities Skin: No rashes, no pallor Psychiatry: Affect normal // no auditory hallucinations   Data Reviewed:  Labs reviewed: Na 136, K 3.8, Cr 0.92, WBC 11.6, Hgb 11.7, Plts 129  Family Communication: Pt in room, family at bedside  Disposition: Status is: Inpatient Remains inpatient appropriate because: severity of illness  Planned Discharge Destination: Home    Author: Rickey Barbara, MD 11/14/2023 5:20 PM  For on call review [email or web address].

## 2023-11-14 NOTE — TOC CM/SW Note (Signed)
    Durable Medical Equipment  (From admission, onward)           Start     Ordered   11/14/23 1435  For home use only DME Other see comment  Once       Comments: Transport chair  Question:  Length of Need  Answer:  Lifetime   11/14/23 1434   11/14/23 1250  For home use only DME Hospital bed  Once       Question Answer Comment  Length of Need Lifetime   Patient has (list medical condition): poor PO intake, abdominal discomfort, acute pancreatitis, hypotension,  recent decline in their functional status   The above medical condition requires: Patient requires the ability to reposition frequently   Head must be elevated greater than: 30 degrees   Bed type Semi-electric      11/14/23 1251   11/14/23 1248  For home use only DME Bedside commode  Once       Comments: Patient confined to a room with no bathroom   recent decline in their functional status  Question:  Patient needs a bedside commode to treat with the following condition  Answer:  Weakness   11/14/23 1251           Patient requires bedside commode due to being confined to a room with no bathroom.

## 2023-11-14 NOTE — Progress Notes (Signed)
 Initial Nutrition Assessment  DOCUMENTATION CODES:   Severe malnutrition in context of acute illness/injury  INTERVENTION:   Boost Breeze po TID, each supplement provides 250 kcal and 9 grams of protein Prosource Plus BID,each packet contains 100 kcal and 15 gm protein  Multivitamin with minerals daily  100 mg Thiamine daily  Provided pancreatitis nutrition therapy education to daughter and provided handouts  When diet advanced beyond  clear liquids recommend: Ensure Enlive po BID, each supplement provides 350 kcal and 20 grams of protein. Magic cup TID with meals, each supplement provides 290 kcal and 9 grams of protein *Prefers vanilla.   NUTRITION DIAGNOSIS:   Severe Malnutrition related to acute illness as evidenced by severe muscle depletion, mild fat depletion, energy intake < or equal to 50% for > or equal to 5 days, percent weight loss (lost 9 lbs, 6.5% in 6 months).   GOAL:   Patient will meet greater than or equal to 90% of their needs   MONITOR:   PO intake, Supplement acceptance, Diet advancement, I & O's, Labs  REASON FOR ASSESSMENT:   Consult Assessment of nutrition requirement/status  ASSESSMENT:  83 y.o female with hx of dementia, FTT, HTN, HLD, DM,  blind right eye. Recent admisson to [facility] for corneal ulcer.  Presented from home due to presyncopal episode, poor po intake and abdominal pain. Found to have acute pancreatitis on CT.  4/2 - CT abdomen: Acute pancreatitis with 8 mm hypodense pancreatic neck lesion   Patient resting in bed, daughter at bedside. Patient was living at home with her son and recently started living with her daughter. Her family provides her meals for her. Daughter reports she had a very good appetite until Monday 3/31. She typically would eat 3 meals and snack throughout the day. She has noticed a sharp decline in her intake since Monday with only taking sips of liquids. Her first real bites of food were yesterday where she had  1/2 banana, 1 grape, and 1 cracker.   Daughter does report she started seeing her mom's dementia advance 6 months ago and she has not been walking much. Even though her mental status has declined she reports patient's intake remained strong. Does endorse some weight loss. Patient was 135 lbs 6 months ago and has lost 9 lbs, 6.5% since then. Not clinically significant but is concerning given overall clinical picture. On exam patient has mild fat and severe muscle wasting.   Discussed the concern over patient's poor PO intake with daughter  and recommended cortrak placement if this continues. Daughter amenable to feeding tube but would like to wait to see how she does over the weekend. Spoke to Dr.Chiu about plan for cortrak Monday if intake does not improve since she will be 7 days with very poor nutrition. He was agreeable.    Dietary recall: Breakfast: Breakfast burrito  Lunch: Ramen noodles Dinner: TV dinner Snacks: fruits and sweets Drinks: sweet tea, water,coke  135 6 months ago now 126  Admit weight: 57.2 kg  Current weight: 57.2 kg   135 lbs 6 months ago now 126. 9 lb, 6.5% weight loss in 6 months, not clinically significant but is concerning given overall clinical picture.   Average Meal Intake: No meals recorded   Nutritionally Relevant Medications: Scheduled Meds:  (feeding supplement) PROSource Plus  30 mL Oral BID BM   atropine  1 drop Right Eye BID   feeding supplement  1 Container Oral TID BM   heparin  5,000 Units  Subcutaneous Q8H   melatonin  5 mg Oral QHS   mirtazapine  30 mg Oral QHS   multivitamin with minerals  1 tablet Oral Daily   pantoprazole  40 mg Oral Daily   rosuvastatin  10 mg Oral Daily   thiamine  100 mg Oral Daily   Labs Reviewed: Calcium 7.7, AST 50, Total bilirubin 1.4, HDL cholesterol 21, Hgb 11.7 No recent CBG's HgbA1c 5.7   NUTRITION - FOCUSED PHYSICAL EXAM:  Flowsheet Row Most Recent Value  Orbital Region Mild depletion  Upper Arm Region  No depletion  Thoracic and Lumbar Region Mild depletion  Buccal Region Mild depletion  Temple Region Moderate depletion  Clavicle Bone Region Mild depletion  Clavicle and Acromion Bone Region Mild depletion  Scapular Bone Region Mild depletion  Patellar Region Severe depletion  Anterior Thigh Region Severe depletion  Posterior Calf Region Severe depletion  Edema (RD Assessment) None  Hair Reviewed  Eyes Unable to assess  [Did not open eyes]  Mouth Reviewed  Skin Reviewed  Nails Reviewed       Diet Order:   Diet Order             Diet clear liquid Room service appropriate? Yes; Fluid consistency: Thin  Diet effective now                   EDUCATION NEEDS:   Education needs have been addressed  Skin:  Skin Assessment: Reviewed RN Assessment  Last BM:  PTA  Height:   Ht Readings from Last 1 Encounters:  11/11/23 5\' 2"  (1.575 m)    Weight:   Wt Readings from Last 1 Encounters:  11/11/23 57.2 kg    Ideal Body Weight:  50 kg  BMI:  Body mass index is 23.05 kg/m.  Estimated Nutritional Needs:   Kcal:  1600-1800 kcal  Protein:  70-90 gm  Fluid:  >1.6L/day   Elliot Dally, RD Registered Dietitian  See Amion for more information

## 2023-11-14 NOTE — Plan of Care (Signed)
  Problem: Education: Goal: Knowledge of General Education information will improve Description: Including pain rating scale, medication(s)/side effects and non-pharmacologic comfort measures Outcome: Not Progressing   Problem: Health Behavior/Discharge Planning: Goal: Ability to manage health-related needs will improve Outcome: Not Progressing   Problem: Nutrition: Goal: Adequate nutrition will be maintained Outcome: Not Progressing   

## 2023-11-14 NOTE — Progress Notes (Signed)
 Patient's daughter is healthcare POA, documentation placed in chart.

## 2023-11-15 DIAGNOSIS — R1011 Right upper quadrant pain: Secondary | ICD-10-CM | POA: Diagnosis not present

## 2023-11-15 DIAGNOSIS — K859 Acute pancreatitis without necrosis or infection, unspecified: Secondary | ICD-10-CM | POA: Diagnosis not present

## 2023-11-15 DIAGNOSIS — E43 Unspecified severe protein-calorie malnutrition: Secondary | ICD-10-CM | POA: Insufficient documentation

## 2023-11-15 LAB — CBC
HCT: 34.2 % — ABNORMAL LOW (ref 36.0–46.0)
Hemoglobin: 11.4 g/dL — ABNORMAL LOW (ref 12.0–15.0)
MCH: 30 pg (ref 26.0–34.0)
MCHC: 33.3 g/dL (ref 30.0–36.0)
MCV: 90 fL (ref 80.0–100.0)
Platelets: 147 10*3/uL — ABNORMAL LOW (ref 150–400)
RBC: 3.8 MIL/uL — ABNORMAL LOW (ref 3.87–5.11)
RDW: 15.4 % (ref 11.5–15.5)
WBC: 11.6 10*3/uL — ABNORMAL HIGH (ref 4.0–10.5)
nRBC: 0 % (ref 0.0–0.2)

## 2023-11-15 LAB — COMPREHENSIVE METABOLIC PANEL WITH GFR
ALT: 32 U/L (ref 0–44)
AST: 48 U/L — ABNORMAL HIGH (ref 15–41)
Albumin: 1.8 g/dL — ABNORMAL LOW (ref 3.5–5.0)
Alkaline Phosphatase: 82 U/L (ref 38–126)
Anion gap: 9 (ref 5–15)
BUN: 13 mg/dL (ref 8–23)
CO2: 22 mmol/L (ref 22–32)
Calcium: 7.6 mg/dL — ABNORMAL LOW (ref 8.9–10.3)
Chloride: 107 mmol/L (ref 98–111)
Creatinine, Ser: 0.97 mg/dL (ref 0.44–1.00)
GFR, Estimated: 58 mL/min — ABNORMAL LOW (ref >60)
Glucose, Bld: 57 mg/dL — ABNORMAL LOW (ref 70–99)
Potassium: 3.4 mmol/L — ABNORMAL LOW (ref 3.5–5.1)
Sodium: 138 mmol/L (ref 135–145)
Total Bilirubin: 1.3 mg/dL — ABNORMAL HIGH (ref 0.0–1.2)
Total Protein: 5.1 g/dL — ABNORMAL LOW (ref 6.5–8.1)

## 2023-11-15 NOTE — Progress Notes (Signed)
 Pt refusing meds this AM.  Rescheduled for later in the evening as pt's daughter stated, pt does not like to be disturbed when asleep.   Re-attempted med administration at 1400 and pt refused. When asked if there was a certain reason why, pt stated "because I sad so." And also added she does not want anything and does not want to see or speak to anyone.   Pt's daughter at bedside for both encounters.

## 2023-11-15 NOTE — Progress Notes (Signed)
  Progress Note   Patient: Pearson Reasons ZOX:[id number] DOB: 06-11-1941 DOA: 11/11/2023     3 DOS: the patient was seen and examined on 11/15/2023   Brief hospital course: 83 year old female with a history of hypertension, hyperlipidemia, diabetes who presented with poor p.o. intake and complaints of upper abdominal discomfort.  In the emergency department, patient was found to be hypotensive with an elevated lactic acid.  Patient was noted to have acute pancreatitis on CT abdomen pelvis with an 8 mm hypodense pancreatic neck lesion seen.  Patient was continued on IV fluid hydration and gastroenterology was consulted   Assessment and Plan: Acute pancreatitis with 8 mm hypodense pancreatic neck lesion -Gastroenterology is consulted and had been following -RUQ reviewed, neg for stones -Per GI, ok to slowly advance diet. Ultimately plan for MRCP as outpatient. [facility] to follow up as outpatient -advanced to solid diet today. Pt was observed eating greasy potato chips by family. RN educated pt and family to avoid greasy foods   Presenting hypotension with history of hypertension -Continue basal fluids as tolerated -Blood pressure currently stable and well-controlled   Lactic acidosis -Continued on basal IV fluids -Follow serial lactic acid levels   Leukocytosis -Urinalysis unremarkable, chest x-ray reviewed, unremarkable -Blood cultures pending, thus far negative -Follow WBC trends   Recent corneal ulcer -Continue atropine drops, moxifloxacin drops, erythromycin ointment per home regimen   History of dementia -Seems stable at this time -Continue supportive care   Hyperlipidemia -Continued on Crestor   Diabetes -Will continue patient on sliding scale insulin as needed -Hgb A1c 5.7   Subjective: Difficult to assess. Pt drowsy this AM  Physical Exam: Vitals:   11/14/23 0852 11/14/23 1749 11/14/23 1934 11/15/23 1652  BP: (!) 143/73 (!) 143/76 (!) 115/95 109/64  Pulse: 87 84 85  92  Resp: 17 16 16 16   Temp: 98.7 F (37.1 C) 98.5 F (36.9 C) 99.6 F (37.6 C) 98.8 F (37.1 C)  TempSrc: Oral Oral Oral Oral  SpO2: 99% 99% 97% 97%  Weight:      Height:       General exam: Awake, laying in bed, in nad Respiratory system: Normal respiratory effort, no wheezing Cardiovascular system: regular rate, s1, s2 Gastrointestinal system: Soft, nondistended, positive BS Central nervous system: CN2-12 grossly intact, strength intact Extremities: Perfused, no clubbing Skin: Normal skin turgor, no notable skin lesions seen Psychiatry: Mood normal // no visual hallucinations   Data Reviewed:  Labs reviewed: Na 138, K 3.4, Cr 0.97, WBC 11.6, hgb 11.4  Family Communication: Pt in room, family at bedside  Disposition: Status is: Inpatient Remains inpatient appropriate because: severity of illness  Planned Discharge Destination: Home    Author: Rickey Barbara, MD 11/15/2023 5:57 PM  For on call review [email or web address].

## 2023-11-15 NOTE — Plan of Care (Signed)
   Problem: Education: Goal: Knowledge of General Education information will improve Description: Including pain rating scale, medication(s)/side effects and non-pharmacologic comfort measures Outcome: Progressing   Problem: Activity: Goal: Risk for activity intolerance will decrease Outcome: Progressing   Problem: Nutrition: Goal: Adequate nutrition will be maintained Outcome: Progressing   Problem: Coping: Goal: Level of anxiety will decrease Outcome: Progressing   Problem: Skin Integrity: Goal: Risk for impaired skin integrity will decrease Outcome: Progressing

## 2023-11-16 DIAGNOSIS — K859 Acute pancreatitis without necrosis or infection, unspecified: Secondary | ICD-10-CM | POA: Diagnosis not present

## 2023-11-16 DIAGNOSIS — R1011 Right upper quadrant pain: Secondary | ICD-10-CM | POA: Diagnosis not present

## 2023-11-16 LAB — COMPREHENSIVE METABOLIC PANEL WITH GFR
ALT: 37 U/L (ref 0–44)
AST: 59 U/L — ABNORMAL HIGH (ref 15–41)
Albumin: 1.7 g/dL — ABNORMAL LOW (ref 3.5–5.0)
Alkaline Phosphatase: 80 U/L (ref 38–126)
Anion gap: 8 (ref 5–15)
BUN: 15 mg/dL (ref 8–23)
CO2: 23 mmol/L (ref 22–32)
Calcium: 7.7 mg/dL — ABNORMAL LOW (ref 8.9–10.3)
Chloride: 107 mmol/L (ref 98–111)
Creatinine, Ser: 0.94 mg/dL (ref 0.44–1.00)
GFR, Estimated: 60 mL/min — ABNORMAL LOW (ref >60)
Glucose, Bld: 70 mg/dL (ref 70–99)
Potassium: 3.4 mmol/L — ABNORMAL LOW (ref 3.5–5.1)
Sodium: 138 mmol/L (ref 135–145)
Total Bilirubin: 1.1 mg/dL (ref 0.0–1.2)
Total Protein: 4.7 g/dL — ABNORMAL LOW (ref 6.5–8.1)

## 2023-11-16 LAB — CULTURE, BLOOD (ROUTINE X 2)
Culture: NO GROWTH
Special Requests: ADEQUATE

## 2023-11-16 MED ORDER — MELATONIN 5 MG PO TABS: 5.0000 mg | ORAL_TABLET | Freq: Every day | ORAL | 0 refills | Status: AC

## 2023-11-16 NOTE — Plan of Care (Signed)

## 2023-11-16 NOTE — TOC Transition Note (Addendum)
 Transition of Care [facility]) - Discharge Note   Patient Details  Name: Claudia [name] MRN: [id number] Date of Birth: 10/02/1940  [facility]) CM/SW Contact:  Ronny Bacon, RN Phone Number: 11/16/2023, 1:19 PM   Clinical Narrative:   Patient is being discharged today. Attempts made to find home health services; [facility]/ [facility]: Declined [facility]: Voicemail left and will send order by fax. Outpatient therapy referral, # [id number], placed for [facility] in case [facility] is unable to accept patient for [facility] services.     Final next level of care: Home w Home Health Services Barriers to Discharge: Continued Medical Work up   Patient Goals and CMS Choice   CMS Medicare.gov Compare Post Acute Care list provided to:: Patient Represenative (must comment) (daughter Claudia [name]) Choice offered to / list presented to : Adult Children      Discharge Placement                       Discharge Plan and Services Additional resources added to the After Visit Summary for     Discharge Planning Services: CM Consult Post Acute Care Choice: Durable Medical Equipment, Home Health          DME Arranged: Hospital bed, Bedside commode         HH Arranged: PT, OT [facility] Agency:  (see note)        Social Drivers of Health (SDOH) Interventions SDOH Screenings   Food Insecurity: No Food Insecurity (11/12/2023)  Housing: Low Risk  (11/12/2023)  Transportation Needs: No Transportation Needs (11/12/2023)  Utilities: Not At Risk (11/12/2023)  Depression (PHQ2-9): Low Risk  (01/11/2020)  Financial Resource Strain: Low Risk  (03/15/2021)  Social Connections: Moderately Isolated (11/12/2023)  Tobacco Use: Medium Risk (11/11/2023)     Readmission Risk Interventions     No data to display

## 2023-11-16 NOTE — Discharge Summary (Signed)
 Physician Discharge Summary   Patient: Claudia [name] MRN: [id number] DOB: Jul 18, 1941  Admit date:     11/11/2023  Discharge date: 11/16/23  Discharge Physician: Rickey Barbara   PCP: Donita Brooks, MD   Recommendations at discharge:    Follow up with PCP in 1-2 weeks Follow up with GI as scheduled  Discharge Diagnoses: Principal Problem:   Pancreatitis, unspecified pancreatitis type Active Problems:   Hypotension due to hypovolemia   Lactic acidosis   Leukocytosis   Abnormal finding on GI tract imaging   Protein-calorie malnutrition, severe  Resolved Problems:   * No resolved hospital problems. *  Hospital Course: 83 year old female with a history of hypertension, hyperlipidemia, diabetes who presented with poor p.o. intake and complaints of upper abdominal discomfort.  In the emergency department, patient was found to be hypotensive with an elevated lactic acid.  Patient was noted to have acute pancreatitis on CT abdomen pelvis with an 8 mm hypodense pancreatic neck lesion seen.  Patient was continued on IV fluid hydration and gastroenterology was consulted   Assessment and Plan: Acute pancreatitis with 8 mm hypodense pancreatic neck lesion -Gastroenterology is consulted and had been following -RUQ reviewed, neg for stones -Per GI, Ultimately plan for MRCP as outpatient. [facility] to follow up as outpatient -ultimately was able to advance to solid diet without issues.    Presenting hypotension with history of hypertension -Continue basal fluids as tolerated -Blood pressure currently stable and well-controlled   Lactic acidosis -Pt was given basal IV fluids   Leukocytosis -Urinalysis unremarkable, chest x-ray reviewed, unremarkable -Blood cultures pending, thus far negative -likely reactive   Recent corneal ulcer -Continue atropine drops, moxifloxacin drops, erythromycin ointment per home regimen   History of dementia -Seems stable at this time    Hyperlipidemia -Continued on Crestor   Diabetes -Pt was continued on SSI as needed this visit -Hgb A1c 5.7       Consultants: GI Procedures performed:   Disposition: Home Diet recommendation:  Regular diet DISCHARGE MEDICATION: Allergies as of 11/16/2023   No Known Allergies      Medication List     STOP taking these medications    chlorhexidine 0.12 % solution Commonly known as: PERIDEX   doxycycline 100 MG tablet Commonly known as: VIBRA-TABS   erythromycin ophthalmic ointment   loratadine 10 MG tablet Commonly known as: CLARITIN   solifenacin 10 MG tablet Commonly known as: VESICARE   traMADol 50 MG tablet Commonly known as: ULTRAM       TAKE these medications    ascorbic acid 1000 MG tablet Commonly known as: VITAMIN C Take 1,000 mg by mouth 2 (two) times daily.   aspirin 81 MG tablet Take 1 tablet (81 mg total) by mouth daily.   atropine 1 % ophthalmic solution Place 1 drop into the right eye in the morning and at bedtime.   [facility] Vitamin B12 1000 MCG tablet Generic drug: cyanocobalamin TOME UNA TABLETA TODOS LOS DIAS   losartan 100 MG tablet Commonly known as: COZAAR TAKE 1 TABLET BY MOUTH EVERY MORNING   melatonin 5 MG Tabs Take 1 tablet (5 mg total) by mouth at bedtime.   metoprolol succinate 25 MG 24 hr tablet Commonly known as: TOPROL-XL TOME UNA TABLETA POR VIA ORAL TODOS LOS DIAS   mirtazapine 30 MG tablet Commonly known as: REMERON Take 1 tablet (30 mg total) by mouth at bedtime.   moxifloxacin 0.5 % ophthalmic solution Commonly known as: VIGAMOX Place 1 drop into the  right eye 3 (three) times daily.   pantoprazole 40 MG tablet Commonly known as: PROTONIX TOMAR UNA (1) TABLETA POR VIA ORAL UNA VEZ AL DIA   rosuvastatin 10 MG tablet Commonly known as: CRESTOR TOMAR 1 TABLETA POR VIA ORAL UNA VEZ AL DIA               Durable Medical Equipment  (From admission, onward)           Start     Ordered    11/14/23 1435  For home use only DME Other see comment  Once       Comments: Transport chair  Question:  Length of Need  Answer:  Lifetime   11/14/23 1434   11/14/23 1250  For home use only DME Hospital bed  Once       Question Answer Comment  Length of Need Lifetime   Patient has (list medical condition): poor PO intake, abdominal discomfort, acute pancreatitis, hypotension,  recent decline in their functional status   The above medical condition requires: Patient requires the ability to reposition frequently   Head must be elevated greater than: 30 degrees   Bed type Semi-electric      11/14/23 1251   11/14/23 1248  For home use only DME Bedside commode  Once       Comments: Patient confined to a room with no bathroom   recent decline in their functional status  Question:  Patient needs a bedside commode to treat with the following condition  Answer:  Weakness   11/14/23 1251            Follow-up Information     Donita Brooks, MD Follow up in 2 week(s).   Specialty: Family Medicine Why: Hospital follow up Contact information: [address] [place] [address] [phone]         [facility] Follow up.   Specialty: Gastroenterology Why: Hospital follow up, as will be scheduled Contact information: [address] [place] [address] [phone]        [facility] Follow up.   Specialty: Rehabilitation Why: Physical and Occupational therapy. Office will contact you for follow up after hospital discharge. Contact information: [address] [place] [address] [phone]               Discharge Exam: Ceasar Mons Weights   11/11/23 2106  Weight: 57.2 kg   General exam: Awake, laying in bed, in nad Respiratory system: Normal respiratory effort, no wheezing Cardiovascular system: regular rate, s1, s2 Gastrointestinal system: Soft,  nondistended, positive BS Central nervous system: CN2-12 grossly intact, strength intact Extremities: Perfused, no clubbing Skin: Normal skin turgor, no notable skin lesions seen Psychiatry: Mood normal // no visual hallucinations   Condition at discharge: fair  The results of significant diagnostics from this hospitalization (including imaging, microbiology, ancillary and laboratory) are listed below for reference.   Imaging Studies: US Abdomen Limited RUQ (LIVER/GB) Result Date: 11/12/2023 CLINICAL DATA:  Pancreatitis EXAM: ULTRASOUND ABDOMEN LIMITED RIGHT UPPER QUADRANT COMPARISON:  CT 11/11/2023 FINDINGS: Gallbladder: Gallbladder is mildly distended. No wall thickening, shadowing stones. Common bile duct: Diameter: Fall mm Liver: No focal lesion identified. Within normal limits in parenchymal echogenicity. Portal vein is patent on color Doppler imaging with normal direction of blood flow towards the liver. Other: Evaluation limited by overlapping bowel gas and soft tissue IMPRESSION: No gallstones or ductal dilatation Electronically Signed  By: Karen Kays M.D.   On: 11/12/2023 17:50   CT ABDOMEN PELVIS W CONTRAST Result Date: 11/11/2023 CLINICAL DATA:  Abdominal pain EXAM: CT ABDOMEN AND PELVIS WITH CONTRAST TECHNIQUE: Multidetector CT imaging of the abdomen and pelvis was performed using the standard protocol following bolus administration of intravenous contrast. RADIATION DOSE REDUCTION: This exam was performed according to the departmental dose-optimization program which includes automated exposure control, adjustment of the mA and/or kV according to patient size and/or use of iterative reconstruction technique. CONTRAST:  75mL OMNIPAQUE IOHEXOL 350 MG/ML SOLN COMPARISON:  None Available. FINDINGS: Lower chest: Linear and ground-glass opacities in the lung bases, favor atelectasis. Coronary artery disease, aortic atherosclerosis. Hepatobiliary: No focal hepatic abnormality. Gallbladder  unremarkable. Pancreas: 8 mm low-density lesion in the pancreatic neck. No ductal dilatation. Questionable surrounding inflammation. Recommend clinical correlation to exclude pancreatitis. Spleen: No focal abnormality.  Normal size. Adrenals/Urinary Tract: No adrenal abnormality. No focal renal abnormality. No stones or hydronephrosis. Urinary bladder is unremarkable. Stomach/Bowel: Mild gaseous distension of the transverse colon. Moderate stool burden in the right colon and transverse colon. Stomach and small bowel decompressed. No bowel obstruction or inflammatory process. Vascular/Lymphatic: Aortic atherosclerosis. No evidence of aneurysm or adenopathy. Reproductive: Prior hysterectomy.  No adnexal masses. Other: Small amount of free fluid in the pelvis.  No free air. Musculoskeletal: No acute bony abnormality. Degenerative disc and facet disease throughout the lumbar spine. IMPRESSION: Haziness around the pancreas may reflect inflammation. Recommend clinical correlation for acute pancreatitis. 8 mm low-density lesion in the pancreatic neck. This could be followed with repeat CT in 6 months. Aortic atherosclerosis. Small amount of free fluid in the pelvis. Moderate stool burden in the right and transverse colon. Electronically Signed   By: Charlett Nose M.D.   On: 11/11/2023 23:48   DG Chest Port 1 View Result Date: 11/11/2023 CLINICAL DATA:  Questionable sepsis - evaluate for abnormality. Dizziness, near syncope EXAM: PORTABLE CHEST 1 VIEW COMPARISON:  None Available. FINDINGS: Heart and mediastinal contours are within normal limits. No focal opacities or effusions. No acute bony abnormality. Aortic atherosclerosis. IMPRESSION: No active disease. Electronically Signed   By: Charlett Nose M.D.   On: 11/11/2023 22:58    Microbiology: Results for orders placed or performed during the hospital encounter of 11/11/23  Blood Culture (routine x 2)     Status: None   Collection Time: 11/11/23 10:28 PM   Specimen:  BLOOD  Result Value Ref Range Status   Specimen Description BLOOD LEFT ANTECUBITAL  Final   Special Requests   Final    BOTTLES DRAWN AEROBIC AND ANAEROBIC Blood Culture adequate volume   Culture   Final    NO GROWTH 5 DAYS Performed at [facility], [address] [place], [place] [address]    Report Status 11/16/2023 FINAL  Final  Blood Culture (routine x 2)     Status: None (Preliminary result)   Collection Time: 11/12/23  5:02 AM   Specimen: BLOOD RIGHT FOREARM  Result Value Ref Range Status   Specimen Description BLOOD RIGHT FOREARM  Final   Special Requests   Final    BOTTLES DRAWN AEROBIC AND ANAEROBIC Blood Culture adequate volume   Culture   Final    NO GROWTH 4 DAYS Performed at [facility], [address], [place], [place] [address]    Report Status PENDING  Incomplete    Labs: CBC: Recent Labs  Lab 11/10/23 1202 11/11/23 2114 11/11/23 2219 11/12/23 0502 11/13/23 5784  11/14/23 0902 11/15/23 1634  WBC 8.4 19.2*  --  19.1* 14.4* 11.6* 11.6*  NEUTROABS 6,443 17.7*  --   --   --   --   --   HGB 12.0 13.8 14.6 13.4 12.0 11.7* 11.4*  HCT 37.5 42.6 43.0 41.1 35.9* 35.6* 34.2*  MCV 93.3 93.6  --  91.3 88.9 90.6 90.0  PLT 180 180  --  145* 125* 129* 147*   Basic Metabolic Panel: Recent Labs  Lab 11/12/23 0502 11/13/23 0653 11/14/23 0902 11/15/23 1634 11/16/23 0611  NA 137 136 136 138 138  K 4.3 3.8 3.8 3.4* 3.4*  CL 106 105 104 107 107  CO2 21* 22 22 22 23   GLUCOSE 79 68* 57* 57* 70  BUN 25* 23 15 13 15   CREATININE 1.18* 1.16* 0.92 0.97 0.94  CALCIUM 8.4* 7.9* 7.7* 7.6* 7.7*  MG 1.6* 2.3  --   --   --   PHOS 2.7  --   --   --   --    Liver Function Tests: Recent Labs  Lab 11/12/23 0502 11/13/23 0653 11/14/23 0902 11/15/23 1634 11/16/23 0611  AST 26 19 50* 48* 59*  ALT 12 12 25  32 37  ALKPHOS 82 85 77 82 80  BILITOT 1.8* 1.5* 1.4* 1.3* 1.1  PROT 5.0* 4.7* 4.7* 5.1* 4.7*  ALBUMIN 2.5* 2.0* 1.9* 1.8* 1.7*   CBG: No results for  input(s): "GLUCAP" in the last 168 hours.  Discharge time spent: less than 30 minutes.  Signed: Rickey Barbara, MD [facility] 11/16/2023

## 2023-11-17 ENCOUNTER — Telehealth: Payer: Self-pay

## 2023-11-17 DIAGNOSIS — K859 Acute pancreatitis without necrosis or infection, unspecified: Secondary | ICD-10-CM

## 2023-11-17 DIAGNOSIS — R933 Abnormal findings on diagnostic imaging of other parts of digestive tract: Secondary | ICD-10-CM

## 2023-11-17 LAB — CULTURE, BLOOD (ROUTINE X 2)
Culture: NO GROWTH
Special Requests: ADEQUATE

## 2023-11-17 NOTE — Telephone Encounter (Signed)
 Copied from CRM [id number]. Topic: General - Other >> Nov 17, 2023  9:39 AM Gurney Maxin H wrote: Reason for CRM: Patients daughter is returning call to clinic for Lanette regarding patient, please reach back out thanks.  Reginna [phone]

## 2023-11-17 NOTE — Telephone Encounter (Signed)
 Patient scheduled for F/U appointment with [facility] on 5-9 at 10:40 am. Order for MRCP at [facility] placed.  Scheduled for Tuesday, 5-

## 2023-11-17 NOTE — Transitions of Care (Post Inpatient/ED Visit) (Unsigned)
   11/17/2023  Name: Giannina Bartolome MRN: [id number] DOB: [birth year]  Today's TOC FU Call Status: Today's TOC FU Call Status:: Unsuccessful Call (1st Attempt) Unsuccessful Call (1st Attempt) Date: 11/17/23  Attempted to reach the patient regarding the most recent Inpatient/ED visit.  Follow Up Plan: Additional outreach attempts will be made to reach the patient to complete the Transitions of Care (Post Inpatient/ED visit) call.   Signature Karena Addison, LPN [facility] Nurse Health Advisor Direct Dial [phone]

## 2023-11-17 NOTE — Telephone Encounter (Signed)
 Please call spanish speaking patient and advise that she has been scheduled for an MRCP for Dr. Adela Lank at [facility] on Tuesday, 5-6 at 11:00 am. She needs to arrive at 10:45 am and have nothing to eat or drink after 7:00 am.  She has also been scheduled for a F/U office visit with [facility] on 5-9 at 10:40 am.  Thank you

## 2023-11-17 NOTE — Telephone Encounter (Signed)
 Spoke with patients daughter [phone] she said okay to the appts that were made.

## 2023-11-17 NOTE — Telephone Encounter (Signed)
 Tuesday, 5-6 at 11am. Patient to arrive 10:45am and NPO 4 hours.

## 2023-11-17 NOTE — Telephone Encounter (Signed)
-----   Message from Claudia [name] sent at 11/14/2023  4:05 PM EDT ----- Regarding: follow up/ MRCP Jan can you place a recall for this patient to have an MRCP in 1 month or so. She also needs follow up with me or APP in the next 4-6 weeks post hospitalization for pancreatitis. Thanks

## 2023-11-18 NOTE — Transitions of Care (Post Inpatient/ED Visit) (Signed)
 11/18/2023  Name: Claudia [name] MRN: [id number] DOB: 1941-01-11  Today's TOC FU Call Status: Today's TOC FU Call Status:: Successful TOC FU Call Completed Unsuccessful Call (1st Attempt) Date: 11/17/23 [facility] FU Call Complete Date: 11/18/23 Patient's Name and Date of Birth confirmed.  Transition Care Management Follow-up Telephone Call Date of Discharge: 11/16/23 Discharge Facility: [facility]) Type of Discharge: Inpatient Admission Primary Inpatient Discharge Diagnosis:: pancreatitis How have you been since you were released from the hospital?: Better Any questions or concerns?: No  Items Reviewed: Did you receive and understand the discharge instructions provided?: Yes Medications obtained,verified, and reconciled?: Yes (Medications Reviewed) Any new allergies since your discharge?: No Dietary orders reviewed?: Yes Do you have support at home?: Yes People in Home [RPT]: child(ren), adult  Medications Reviewed Today: Medications Reviewed Today     Reviewed by Karena Addison, LPN (Licensed Practical Nurse) on 11/18/23 at 1133  Med List Status: <None>   Medication Order Taking? Sig Documenting Provider Last Dose Status Informant  ascorbic acid (VITAMIN C) 1000 MG tablet [id number] No Take 1,000 mg by mouth 2 (two) times daily. [provider] 11/08/2023 Active Child, Pharmacy Records  aspirin 81 MG tablet [id number] No Take 1 tablet (81 mg total) by mouth daily.  Patient not taking: Reported on 11/13/2023   Dorena Bodo, PA-C Not Taking Active Child, Pharmacy Records  atropine 1 % ophthalmic solution [id number] No Place 1 drop into the right eye in the morning and at bedtime. [provider] Past Week Active Child, Pharmacy Records  [facility] VITAMIN B12 1000 MCG tablet [id number] No TOME UNA TABLETA TODOS LOS DIAS Donita Brooks, MD 11/08/2023 Active Child, Pharmacy Records  losartan (COZAAR) 100 MG tablet [id number] No TAKE 1 TABLET BY MOUTH EVERY MORNING Donita Brooks, MD 11/08/2023 Active Child, Pharmacy Records  melatonin 5 MG TABS [id number]  Take 1 tablet (5 mg total) by mouth at bedtime. Jerald Kief, MD  Active   metoprolol succinate (TOPROL-XL) 25 MG 24 hr tablet [id number] No TOME UNA TABLETA POR VIA ORAL TODOS LOS DIAS Donita Brooks, MD 11/08/2023 Active Child, Pharmacy Records  mirtazapine (REMERON) 30 MG tablet [id number] No Take 1 tablet (30 mg total) by mouth at bedtime.  Patient not taking: Reported on 11/13/2023   Donita Brooks, MD Not Taking Active Child, Pharmacy Records           Med Note [facility] [place], [place] A   Thu Nov 13, 2023  3:53 PM) Pt has not started medication  moxifloxacin (VIGAMOX) 0.5 % ophthalmic solution [id number] No Place 1 drop into the right eye 3 (three) times daily. [provider] Past Week Active Child, Pharmacy Records  pantoprazole (PROTONIX) 40 MG tablet [id number] No TOMAR UNA (1) TABLETA POR VIA ORAL UNA VEZ AL DIA Donita Brooks, MD 11/08/2023 Active Child, Pharmacy Records  rosuvastatin (CRESTOR) 10 MG tablet [id number] No TOMAR 1 TABLETA POR VIA ORAL UNA VEZ AL DIA Donita Brooks, MD 11/08/2023 Active Child, Pharmacy Records            Home Care and Equipment/Supplies: Were Home Health Services Ordered?: NA Any new equipment or medical supplies ordered?: NA  Functional Questionnaire: Do you need assistance with bathing/showering or dressing?: Yes Do you need assistance with meal preparation?: Yes Do you need assistance with eating?: No Do you have difficulty maintaining continence: No Do you need assistance with getting out of bed/getting out of a chair/moving?: Yes Do you have difficulty managing or  taking your medications?: Yes  Follow up appointments reviewed: PCP Follow-up appointment confirmed?: No (declined) MD Provider Line Number:[phone] Given: No Specialist Hospital Follow-up appointment confirmed?: Yes Date of Specialist follow-up appointment?:  12/16/23 Follow-Up Specialty Provider:: GI Do you need transportation to your follow-up appointment?: No Do you understand care options if your condition(s) worsen?: Yes-patient verbalized understanding    SIGNATURE Karena Addison, LPN [facility] Nurse Health Advisor Direct Dial [phone]

## 2023-11-19 ENCOUNTER — Telehealth: Payer: Self-pay

## 2023-11-19 NOTE — Telephone Encounter (Signed)
 Copied from CRM [id number]. Topic: Clinical - Home Health Verbal Orders >> Nov 19, 2023  3:52 PM Franchot Heidelberg wrote: Pt's daughter called requesting for the patient to receive home health skilled nursing because she believes the pt's dementia is advancing. Please advise if this is possible and if an appt is needed. The patient just got out of the hospital last Sunday.   Best contact: [phone]

## 2023-11-21 ENCOUNTER — Telehealth: Admitting: Family Medicine

## 2023-11-21 ENCOUNTER — Telehealth: Payer: Self-pay

## 2023-11-21 ENCOUNTER — Ambulatory Visit: Payer: Self-pay

## 2023-11-21 DIAGNOSIS — F03B11 Unspecified dementia, moderate, with agitation: Secondary | ICD-10-CM

## 2023-11-21 DIAGNOSIS — K859 Acute pancreatitis without necrosis or infection, unspecified: Secondary | ICD-10-CM | POA: Diagnosis not present

## 2023-11-21 DIAGNOSIS — R634 Abnormal weight loss: Secondary | ICD-10-CM | POA: Diagnosis not present

## 2023-11-21 DIAGNOSIS — R630 Anorexia: Secondary | ICD-10-CM | POA: Diagnosis not present

## 2023-11-21 MED ORDER — MEGESTROL ACETATE 400 MG/10ML PO SUSP: 400.0000 mg | Freq: Every day | ORAL | 11 refills | Status: AC

## 2023-11-21 MED ORDER — PANTOPRAZOLE SODIUM 40 MG PO TBEC: 40.0000 mg | DELAYED_RELEASE_TABLET | Freq: Every day | ORAL | 11 refills | Status: AC

## 2023-11-21 NOTE — Telephone Encounter (Signed)
 Copied from CRM [id number]. Topic: Clinical - Medical Advice >> Nov 21, 2023  8:06 AM Beacher May wrote: Reason for CRM: Daughter of pt called because pt has not eaten for 2-3 days now, and is suffering from fatigue/tiredness and just wants top sleep for extended periods of time. Was released from the hospital 4/6 after suffering from pancreatis.    First attempt; left message.

## 2023-11-21 NOTE — Telephone Encounter (Unsigned)
 Copied from CRM [id number]. Topic: Clinical - Medical Advice >> Nov 21, 2023  8:06 AM Beacher May wrote: Reason for CRM: Daughter of pt called because pt has not eaten for 2-3 days now, and is suffering from fatigue/tiredness and just wants top sleep for extended periods of time. Was released from the hospital 4/6 after suffering from pancreatis.

## 2023-11-21 NOTE — Telephone Encounter (Signed)
 Chief Complaint: weakness and fatigue Symptoms: weakness and fatigue no appetite Frequency: since Sunday Pertinent Negatives: Patient denies chest  Disposition: [] ED /[] Urgent Care (no appt availability in office) / [] Appointment(In office/virtual)/ []  [facility]/ [] Home Care/ [] Refused Recommended Disposition /[] [facility]/ [x]  Follow-up with PCP Additional Notes: pts daughter states that since the pt got home from the hospital she is not eating or drinking much, states that pt basically want to sleep all day. States pt becomes angry when they try to offer her anything. Denies a decrease in urination.  Denies patient c/o of pain. States that she was she was told they could get a transfer chair and beside toilet but has not heard back from anyone about those. States she is just at a loss of what to do.  Scheduled virtual appt for daughter to discuss concerns with Dr. Tanya Nones.   Copied from CRM [id number]. Topic: Clinical - Medical Advice >> Nov 21, 2023  8:06 AM Beacher May wrote: Reason for CRM: Daughter of pt called because pt has not eaten for 2-3 days now, and is suffering from fatigue/tiredness and just wants top sleep for extended periods of time. Was released from the hospital 4/6 after suffering from pancreatis. Reason for Disposition  [1] MODERATE weakness (i.e., interferes with work, school, normal activities) AND [2] persists > 3 days  Answer Assessment - Initial Assessment Questions 1. DESCRIPTION: "Describe how you are feeling."     Sleeping all the time 2. SEVERITY: "How bad is it?"  "Can you stand and walk?"   - MILD (0-3): Feels weak or tired, but does not interfere with work, school or normal activities.   - MODERATE (4-7): Able to stand and walk; weakness interferes with work, school, or normal activities.   - SEVERE (8-10): Unable to stand or walk; unable to do usual activities.     Can walk but some weakness 3. ONSET: "When did these symptoms begin?"  (e.g., hours, days, weeks, months)     About a week 4. CAUSE: "What do you think is causing the weakness or fatigue?" (e.g., not drinking enough fluids, medical problem, trouble sleeping)     Not eating or drinking 5. NEW MEDICINES:  "Have you started on any new medicines recently?" (e.g., opioid pain medicines, benzodiazepines, muscle relaxants, antidepressants, antihistamines, neuroleptics, beta blockers)     no 6. OTHER SYMPTOMS: "Do you have any other symptoms?" (e.g., chest pain, fever, cough, SOB, vomiting, diarrhea, bleeding, other areas of pain)     no  Protocols used: Weakness (Generalized) and Fatigue-A-AH

## 2023-11-21 NOTE — Progress Notes (Signed)
 Subjective:    Patient ID: Claudia [name], female    DOB: Aug 02, 1941, 83 y.o.   MRN: [id number]  HPI  Patient is being seen today as a video visit.  Patient is currently at home with her daughter.  I am currently in my office.  They consent to be seen by video.  Visit began at 1226.  Visit concluded at 1250.  Please see my last office visit.  Patient was recently admitted to the hospital with pancreatitis.  Lipase was greater than 400.  CT scan showed mild inflammation around the pancreas and a nonspecific lesion that was 8 mm in the pancreatic head.  GI has recommended outpatient MRCP to evaluate further.  However daughter states that patient is decompensating again.  Since she has been home from the hospital she is not eating.  She states that her urine is not as dark as when she went to the hospital and that her mouth is still wet.  However she is not wanting to eat at all.  The patient has moderate dementia.  However the patient denies any abdominal pain.  She is still taking losartan and rosuvastatin despite having been hypotensive at the hospital.  We had previously held metoprolol and tramadol.  The patient never started the mirtazapine.  We had started this for appetite stimulant.  However now the patient is very sleepy.  She spends most of the day in bed sleeping.  She is very weak and unsteady on her feet.  In fact she fell this morning trying to get out of bed.  Daughter denies any fevers or chills.  She denies any abdominal pain.  She palpates on the patient's abdomen and the patient denies any pain.  Daughter denies any blood in the stool or melena.  She denies any cough. Past Medical History:  Diagnosis Date   Anemia    Arthritis    B12 deficiency 01/11/2020   Blind right eye    Diabetes mellitus without complication (HCC)    GERD (gastroesophageal reflux disease)    chronic gastritis   Hyperlipidemia    Hypertension    Past Surgical History:  Procedure Laterality Date   HERNIA  REPAIR     INDUCED ABORTION     Current Outpatient Medications on File Prior to Visit  Medication Sig Dispense Refill   ascorbic acid (VITAMIN C) 1000 MG tablet Take 1,000 mg by mouth 2 (two) times daily.     aspirin 81 MG tablet Take 1 tablet (81 mg total) by mouth daily. (Patient not taking: Reported on 11/13/2023) 30 tablet 11   atropine 1 % ophthalmic solution Place 1 drop into the right eye in the morning and at bedtime.     [facility] VITAMIN B12 1000 MCG tablet TOME UNA TABLETA TODOS LOS DIAS 200 tablet 5   losartan (COZAAR) 100 MG tablet TAKE 1 TABLET BY MOUTH EVERY MORNING 90 tablet 1   melatonin 5 MG TABS Take 1 tablet (5 mg total) by mouth at bedtime. 20 tablet 0   metoprolol succinate (TOPROL-XL) 25 MG 24 hr tablet TOME UNA TABLETA POR VIA ORAL TODOS LOS DIAS 30 tablet 2   mirtazapine (REMERON) 30 MG tablet Take 1 tablet (30 mg total) by mouth at bedtime. (Patient not taking: Reported on 11/13/2023) 30 tablet 6   moxifloxacin (VIGAMOX) 0.5 % ophthalmic solution Place 1 drop into the right eye 3 (three) times daily.     pantoprazole (PROTONIX) 40 MG tablet TOMAR UNA (1) TABLETA POR VIA  ORAL UNA VEZ AL DIA 30 tablet 2   rosuvastatin (CRESTOR) 10 MG tablet TOMAR 1 TABLETA POR VIA ORAL UNA VEZ AL DIA 30 tablet 2   No current facility-administered medications on file prior to visit.   No Known Allergies Social History   Socioeconomic History   Marital status: Single    Spouse name: Not on file   Number of children: 4   Years of education: Not on file   Highest education level: Not on file  Occupational History   Not on file  Tobacco Use   Smoking status: Former    Current packs/day: 0.00    Types: Cigarettes    Quit date: 46    Years since quitting: 44.3   Smokeless tobacco: Never  Vaping Use   Vaping status: Never Used  Substance and Sexual Activity   Alcohol use: No   Drug use: No   Sexual activity: Not Currently  Other Topics Concern   Not on file  Social History  Narrative   Not on file   Social Drivers of Health   Financial Resource Strain: Low Risk  (03/15/2021)   Overall Financial Resource Strain (CARDIA)    Difficulty of Paying Living Expenses: Not very hard  Food Insecurity: No Food Insecurity (11/12/2023)   Hunger Vital Sign    Worried About Running Out of Food in the Last Year: Never true    Ran Out of Food in the Last Year: Never true  Transportation Needs: No Transportation Needs (11/12/2023)   PRAPARE - Administrator, Civil Service (Medical): No    Lack of Transportation (Non-Medical): No  Physical Activity: Not on file  Stress: Not on file  Social Connections: Moderately Isolated (11/12/2023)   Social Connection and Isolation Panel [NHANES]    Frequency of Communication with Friends and Family: Never    Frequency of Social Gatherings with Friends and Family: More than three times a week    Attends Religious Services: Never    Database administrator or Organizations: Yes    Attends Banker Meetings: Never    Marital Status: Widowed  Intimate Partner Violence: Not At Risk (11/12/2023)   Humiliation, Afraid, Rape, and Kick questionnaire    Fear of Current or Ex-Partner: No    Emotionally Abused: No    Physically Abused: No    Sexually Abused: No     Review of Systems  All other systems reviewed and are negative.      Objective:   Physical Exam        Assessment & Plan:  Weight loss  Moderate dementia with agitation, unspecified dementia type (HCC)  Anorexia  Pancreatitis, unspecified pancreatitis type First, we are going to discontinue all medication.  The patient is hypotensive and does not require losartan or metoprolol.  We will also discontinue rosuvastatin.  Continue pantoprazole 40 mg a day in case there is an element of gastritis causing her to not to want to eat given that the history is limited by her dementia.  Daughter is adamant that she is not in pain today therefore I do not believe  the pancreatitis is causing her to have anorexia.  I believe that this is more likely due to her dementia.  We will try adding Megace 10 mL daily as an appetite stimulant to increase oral intake.  Push fluids.  Consult home health nursing for physical therapy due to weakness and deconditioning.  If worsening they will need to go back to  the hospital.

## 2023-11-21 NOTE — Telephone Encounter (Signed)
 Patient has already been triages. See encounters for previous triage notes.

## 2023-11-21 NOTE — Telephone Encounter (Signed)
 Copied from CRM [id number]. Topic: Clinical - Medical Advice >> Nov 21, 2023  8:06 AM Beacher May wrote: Reason for CRM: Daughter of pt called because pt has not eaten for 2-3 days now, and is suffering from fatigue/tiredness and just wants top sleep for extended periods of time. Was released from the hospital 4/6 after suffering from pancreatis.    Second attempt; no answer.

## 2023-11-21 NOTE — Telephone Encounter (Signed)
See triage call.

## 2023-11-21 NOTE — Telephone Encounter (Signed)
 DUPLICATE. ORIGINAL TE FROM TRIAGE SENT TO DR. PICKARD. Mjp,lpn  Copied from CRM [id number]. Topic: Clinical - Medical Advice >> Nov 21, 2023  8:06 AM Claudia [name] wrote: Reason for CRM: Daughter of pt called because pt has not eaten for 2-3 days now, and is suffering from fatigue/tiredness and just wants top sleep for extended periods of time. Was released from the hospital 4/6 after suffering from pancreatis.

## 2023-11-28 ENCOUNTER — Ambulatory Visit: Admitting: Family Medicine

## 2023-12-01 ENCOUNTER — Ambulatory Visit: Payer: Self-pay

## 2023-12-01 ENCOUNTER — Telehealth: Payer: Self-pay

## 2023-12-01 NOTE — Telephone Encounter (Signed)
 Pt's granddaughter came into drop off ppw to be completed by pcp for [facility]). Pt's daughter would like a call once pcp has completed his portion and ppw is available for pick up to be completed by Pt's daughter ([facility]). Pt's granddaughter completed an intake form for ppw. Intake form and [facility] ppw placed in nurse's folder. Please advise.  Cb#: [phone]

## 2023-12-01 NOTE — Telephone Encounter (Signed)
 Copied from CRM [id number]. Topic: Clinical - Red Word Triage >> Dec 01, 2023 10:49 AM Rennis Case wrote: Red Word that prompted transfer to Nurse Triage:  Patient has has dementia, eating poorly, very weak.  Chief Complaint: Lack of appetite, fatigue Symptoms: Sleeping all day, baseline weakness and confusion Frequency: Ongoing, worsened after hospital admission on 11/11/23 Pertinent Negatives: Patient denies abdominal pain Disposition: [] ED /[] Urgent Care (no appt availability in office) / [] Appointment(In office/virtual)/ []  [facility]/ [] Home Care/ [] Refused Recommended Disposition /[] [place] [facility]/ [x]  Follow-up with PCP Additional Notes: Spoke to patient's daughter, Akirah, on behalf of the patient. Patient was seen via telemedicine by PCP on 11/21/23. Gionna stated patient is still experiencing lack of appetite and fatigue. Trenee declined worsening confusion. Patient is still able to move around and perform ADLs, but prefers to lay down and states she "doesn't feel like doing anything". Lesha stated the patient is still not eating well and prefers candy and sweets. Sherol stated she stopped giving patient Megace  because she wasn't sure if it could be given on an empty stomach. Minie is specifically requesting PCP's advice on how to proceed and has questions about if these symptoms are a "normal" progression of demential. Amanie is also requesting a wheel chair and referral for home health. Please advise.   Reason for Disposition  [1] Caller has NON-URGENT question (includes prescribed medication questions) AND [2] triager unable to answer  Answer Assessment - Initial Assessment Questions 1. MAIN CONCERN OR SYMPTOM:  "What is your main concern right now?" "What questions do you have?" "What's the main symptom you're worried about?" (e.g., confusion, memory loss)     Lack of appetite and sleeping all day  2. ONSET:  "When did the symptom start (or worsen)?" (minutes, hours, days,  weeks)     States symptoms have worsened since hospital admission on 11/11/23 3. BETTER-SAME-WORSE: "Are you (the patient) getting better, staying the same, or getting worse compared to the day you (they) were diagnosed or most recent hospital discharge ?"     States weakness has worsened since hospital visit 4. DIAGNOSIS: "Was the dementia diagnosed by a doctor?" If Yes, ask: "When?" (e.g., days, months, years ago)     Yes 5. MEDICINES: "Has there been any change in medicines recently?" (e.g., narcotics, antihistamines, benzodiazepines, etc.)     PCP recently stopped majority of medications at visit on 11/21/23 6. OTHER SYMPTOMS: "Are there any other symptoms?" (e.g., fever, cough, pain, falling) Trying to give patient Pedialyte and Popsicles, will take a few bites of foods  Still able to get around and get to the bathroom, but is "slouchy"  Weakness has worsened since hospital admission on 11/11/23 Abdominal pain has gone away States patient "does not want to do anything" Denies worsening confusion 7. SUPPORT: Document living circumstances and support (e.g., family, nursing home)     With daughter  Protocols used: Dementia Symptoms and Questions-A-AH

## 2023-12-02 ENCOUNTER — Other Ambulatory Visit: Payer: Self-pay | Admitting: Family Medicine

## 2023-12-02 ENCOUNTER — Other Ambulatory Visit: Payer: Self-pay

## 2023-12-02 DIAGNOSIS — F03B11 Unspecified dementia, moderate, with agitation: Secondary | ICD-10-CM

## 2023-12-02 DIAGNOSIS — R634 Abnormal weight loss: Secondary | ICD-10-CM

## 2023-12-02 DIAGNOSIS — E43 Unspecified severe protein-calorie malnutrition: Secondary | ICD-10-CM

## 2023-12-03 ENCOUNTER — Telehealth: Payer: Self-pay

## 2023-12-03 NOTE — Telephone Encounter (Signed)
 Copied from CRM [id number]. Topic: Clinical - Medical Advice >> Dec 02, 2023  4:50 PM Phil Braun wrote: Reason for CRM:   Star with [facility] called in regards to hospicare of pt. The family ask that she call and see if Dr Cheril Cork would be willing to be attending physician for Hospice, if the pt has 6 mths or less?   Please call Star back and advise.

## 2023-12-04 ENCOUNTER — Telehealth: Payer: Self-pay

## 2023-12-04 ENCOUNTER — Telehealth: Payer: Self-pay | Admitting: Family Medicine

## 2023-12-04 ENCOUNTER — Other Ambulatory Visit: Payer: Self-pay | Admitting: Family Medicine

## 2023-12-04 ENCOUNTER — Encounter: Payer: Self-pay | Admitting: Family Medicine

## 2023-12-04 DIAGNOSIS — R630 Anorexia: Secondary | ICD-10-CM

## 2023-12-04 DIAGNOSIS — G309 Alzheimer's disease, unspecified: Secondary | ICD-10-CM

## 2023-12-04 NOTE — Telephone Encounter (Signed)
 Copied from CRM [id number]. Topic: Clinical - Medical Advice >> Dec 02, 2023  4:50 PM Phil Braun wrote: Reason for CRM:   Star with [facility] called in regards to hospicare of pt. The family ask that she call and see if Dr Cheril Cork would be willing to be attending physician for Hospice, if the pt has 6 mths or less?   Please call Star back and advise.

## 2023-12-04 NOTE — Telephone Encounter (Signed)
 Copied from CRM [id number]. Topic: Referral - Question >> Dec 04, 2023  3:28 PM Opal Bill wrote: Reason for CRM: [facility] called and they need to know if Dr. Cheril Cork is going to be the patient's hospice attending while she is that care? Please contact Star (LPN) directly at [phone].

## 2023-12-05 ENCOUNTER — Telehealth: Payer: Self-pay

## 2023-12-05 DIAGNOSIS — K859 Acute pancreatitis without necrosis or infection, unspecified: Secondary | ICD-10-CM | POA: Diagnosis not present

## 2023-12-05 DIAGNOSIS — E119 Type 2 diabetes mellitus without complications: Secondary | ICD-10-CM | POA: Diagnosis not present

## 2023-12-05 NOTE — Telephone Encounter (Signed)
 Copied from CRM [id number]. Topic: Clinical - Medical Advice >> Dec 02, 2023  4:50 PM Phil Braun wrote: Reason for CRM:   Star with [facility] called in regards to hospicare of pt. The family ask that she call and see if Dr Cheril Cork would be willing to be attending physician for Hospice, if the pt has 6 mths or less?   Please call Star back and advise.

## 2023-12-13 ENCOUNTER — Other Ambulatory Visit: Payer: Self-pay | Admitting: Family Medicine

## 2023-12-16 ENCOUNTER — Ambulatory Visit (HOSPITAL_COMMUNITY): Admission: RE | Admit: 2023-12-16 | Source: Ambulatory Visit

## 2023-12-16 NOTE — Telephone Encounter (Signed)
 Requested Prescriptions  Refused Prescriptions Disp Refills   mirtazapine  (REMERON ) 30 MG tablet [Pharmacy Med Name: MIRTAZAPINE  30 MG TABLET] 90 tablet 3    Sig: TAKE 1 TABLET BY MOUTH AT BEDTIME.     Psychiatry: Antidepressants - mirtazapine  Failed - 12/16/2023 11:43 AM      Failed - Valid encounter within last 6 months    Recent Outpatient Visits           3 weeks ago Weight loss   [place] [facility] Pickard, Cisco Crest, MD   1 month ago Weight loss   [place] [facility] Austine Lefort, MD   6 months ago Controlled type 2 diabetes mellitus without complication, without long-term current use of insulin [facility])   [place] [facility] Austine Lefort, MD   1 year ago Controlled type 2 diabetes mellitus without complication, without long-term current use of insulin [facility])   [place] [facility] Pickard, Cisco Crest, MD

## 2023-12-19 ENCOUNTER — Ambulatory Visit: Admitting: Gastroenterology

## 2023-12-19 NOTE — Progress Notes (Deleted)
 Chief Complaint: Hospital follow-up Primary GI MD: Dr. General Kenner  HPI: 83 year old female with medical history as listed below presents for hospital follow-up for pancreatitis  Recently presented to ED with decreased appetite, abdominal pain, nausea vomiting for 11/12/2023 and diagnosed with pancreatitis with lipase elevated to 400s and CT scan showing pancreatitis.  Patient also have leukocytosis and low-grade temp.  Labs showed mildly elevated indirect bilirubin. No gallstones on US .  Discussed the use of AI scribe software for clinical note transcription with the patient, who gave verbal consent to proceed.  History of Present Illness      PREVIOUS GI WORKUP     Past Medical History:  Diagnosis Date   Anemia    Arthritis    B12 deficiency 01/11/2020   Blind right eye    Dementia (HCC)    Diabetes mellitus without complication (HCC)    Failure to thrive (child)    GERD (gastroesophageal reflux disease)    chronic gastritis   Hyperlipidemia    Hypertension    Pancreatitis     Past Surgical History:  Procedure Laterality Date   HERNIA REPAIR     INDUCED ABORTION      Current Outpatient Medications  Medication Sig Dispense Refill   ascorbic acid (VITAMIN C) 1000 MG tablet Take 1,000 mg by mouth 2 (two) times daily.     aspirin  81 MG tablet Take 1 tablet (81 mg total) by mouth daily. (Patient not taking: Reported on 11/13/2023) 30 tablet 11   atropine  1 % ophthalmic solution Place 1 drop into the right eye in the morning and at bedtime.     [facility] VITAMIN B12 1000 MCG tablet TOME UNA TABLETA TODOS LOS DIAS 200 tablet 5   losartan  (COZAAR ) 100 MG tablet TAKE 1 TABLET BY MOUTH EVERY MORNING 90 tablet 1   megestrol  (MEGACE ) 400 MG/10ML suspension Take 10 mLs (400 mg total) by mouth daily. 300 mL 11   melatonin 5 MG TABS Take 1 tablet (5 mg total) by mouth at bedtime. 20 tablet 0   metoprolol  succinate (TOPROL -XL) 25 MG 24 hr tablet TOME UNA TABLETA POR VIA ORAL TODOS LOS  DIAS 30 tablet 2   mirtazapine  (REMERON ) 30 MG tablet Take 1 tablet (30 mg total) by mouth at bedtime. (Patient not taking: Reported on 11/13/2023) 30 tablet 6   moxifloxacin (VIGAMOX) 0.5 % ophthalmic solution Place 1 drop into the right eye 3 (three) times daily.     pantoprazole  (PROTONIX ) 40 MG tablet TOMAR UNA (1) TABLETA POR VIA ORAL UNA VEZ AL DIA 30 tablet 2   pantoprazole  (PROTONIX ) 40 MG tablet Take 1 tablet (40 mg total) by mouth daily. 30 tablet 11   rosuvastatin  (CRESTOR ) 10 MG tablet TOMAR 1 TABLETA POR VIA ORAL UNA VEZ AL DIA 30 tablet 2   No current facility-administered medications for this visit.    Allergies as of 12/19/2023   (No Known Allergies)    Family History  Problem Relation Age of Onset   Diabetes Sister    Diabetes Daughter     Social History   Socioeconomic History   Marital status: Single    Spouse name: Not on file   Number of children: 4   Years of education: Not on file   Highest education level: Not on file  Occupational History   Not on file  Tobacco Use   Smoking status: Former    Current packs/day: 0.00    Types: Cigarettes    Quit date: 70  Years since quitting: 44.3   Smokeless tobacco: Never  Vaping Use   Vaping status: Never Used  Substance and Sexual Activity   Alcohol use: No   Drug use: No   Sexual activity: Not Currently  Other Topics Concern   Not on file  Social History Narrative   Not on file   Social Drivers of Health   Financial Resource Strain: Low Risk  (03/15/2021)   Overall Financial Resource Strain (CARDIA)    Difficulty of Paying Living Expenses: Not very hard  Food Insecurity: No Food Insecurity (11/12/2023)   Hunger Vital Sign    Worried About Running Out of Food in the Last Year: Never true    Ran Out of Food in the Last Year: Never true  Transportation Needs: No Transportation Needs (11/12/2023)   PRAPARE - Administrator, Civil Service (Medical): No    Lack of Transportation (Non-Medical):  No  Physical Activity: Not on file  Stress: Not on file  Social Connections: Moderately Isolated (11/12/2023)   Social Connection and Isolation Panel [NHANES]    Frequency of Communication with Friends and Family: Never    Frequency of Social Gatherings with Friends and Family: More than three times a week    Attends Religious Services: Never    Database administrator or Organizations: Yes    Attends Banker Meetings: Never    Marital Status: Widowed  Intimate Partner Violence: Not At Risk (11/12/2023)   Humiliation, Afraid, Rape, and Kick questionnaire    Fear of Current or Ex-Partner: No    Emotionally Abused: No    Physically Abused: No    Sexually Abused: No    Review of Systems:    Constitutional: No weight loss, fever, chills, weakness or fatigue HEENT: Eyes: No change in vision               Ears, Nose, Throat:  No change in hearing or congestion Skin: No rash or itching Cardiovascular: No chest pain, chest pressure or palpitations   Respiratory: No SOB or cough Gastrointestinal: See HPI and otherwise negative Genitourinary: No dysuria or change in urinary frequency Neurological: No headache, dizziness or syncope Musculoskeletal: No new muscle or joint pain Hematologic: No bleeding or bruising Psychiatric: No history of depression or anxiety    Physical Exam:  Vital signs: There were no vitals taken for this visit.  Constitutional: NAD, alert and cooperative Head:  Normocephalic and atraumatic. Eyes:   PEERL, EOMI. No icterus. Conjunctiva pink. Respiratory: Respirations even and unlabored. Lungs clear to auscultation bilaterally.   No wheezes, crackles, or rhonchi.  Cardiovascular:  Regular rate and rhythm. No peripheral edema, cyanosis or pallor.  Gastrointestinal:  Soft, nondistended, nontender. No rebound or guarding. Normal bowel sounds. No appreciable masses or hepatomegaly. Rectal:  Declines Msk:  Symmetrical without gross deformities. Without edema,  no deformity or joint abnormality.  Neurologic:  Alert and  oriented x4;  grossly normal neurologically.  Skin:   Dry and intact without significant lesions or rashes. Psychiatric: Oriented to person, place and time. Demonstrates good judgement and reason without abnormal affect or behaviors.  Physical Exam    RELEVANT LABS AND IMAGING: CBC    Component Value Date/Time   WBC 11.6 (H) 11/15/2023 1634   RBC 3.80 (L) 11/15/2023 1634   HGB 11.4 (L) 11/15/2023 1634   HCT 34.2 (L) 11/15/2023 1634   PLT 147 (L) 11/15/2023 1634   MCV 90.0 11/15/2023 1634   MCH 30.0 11/15/2023 1634  MCHC 33.3 11/15/2023 1634   RDW 15.4 11/15/2023 1634   LYMPHSABS 0.6 (L) 11/11/2023 2114   MONOABS 0.8 11/11/2023 2114   EOSABS 0.0 11/11/2023 2114   BASOSABS 0.0 11/11/2023 2114    CMP     Component Value Date/Time   NA 138 11/16/2023 0611   K 3.4 (L) 11/16/2023 0611   CL 107 11/16/2023 0611   CO2 23 11/16/2023 0611   GLUCOSE 70 11/16/2023 0611   BUN 15 11/16/2023 0611   CREATININE 0.94 11/16/2023 0611   CREATININE 1.35 (H) 11/10/2023 1202   CALCIUM  7.7 (L) 11/16/2023 0611   PROT 4.7 (L) 11/16/2023 0611   ALBUMIN 1.7 (L) 11/16/2023 0611   AST 59 (H) 11/16/2023 0611   ALT 37 11/16/2023 0611   ALKPHOS 80 11/16/2023 0611   BILITOT 1.1 11/16/2023 0611   GFRNONAA 60 (L) 11/16/2023 0611   GFRNONAA 47 (L) 09/11/2020 1136   GFRAA 54 (L) 09/11/2020 1136     Assessment/Plan:   Assessment and Plan Assessment & Plan    Pancreatitis Admitted 11/2023 with first episode of pancreatitis with lipase in 400s, CT showing pancreatitis and small lesion in the pancreatic neck on CT.  No new meds.  No gallstones.  Scheduled for outpatient MRCP   [facility] 12/19/2023, 8:18 AM  Cc: Austine Lefort, MD

## 2024-01-02 DIAGNOSIS — H16001 Unspecified corneal ulcer, right eye: Secondary | ICD-10-CM | POA: Diagnosis not present

## 2024-01-11 DIAGNOSIS — E119 Type 2 diabetes mellitus without complications: Secondary | ICD-10-CM | POA: Diagnosis not present

## 2024-01-11 DIAGNOSIS — K859 Acute pancreatitis without necrosis or infection, unspecified: Secondary | ICD-10-CM | POA: Diagnosis not present

## 2024-01-19 ENCOUNTER — Other Ambulatory Visit: Payer: Self-pay | Admitting: Family Medicine

## 2024-01-19 DIAGNOSIS — I10 Essential (primary) hypertension: Secondary | ICD-10-CM

## 2024-01-19 DIAGNOSIS — E7800 Pure hypercholesterolemia, unspecified: Secondary | ICD-10-CM

## 2024-01-26 ENCOUNTER — Other Ambulatory Visit: Payer: Self-pay | Admitting: Family Medicine

## 2024-01-26 ENCOUNTER — Telehealth: Payer: Self-pay

## 2024-01-26 MED ORDER — LORAZEPAM 0.5 MG PO TABS: 0.5000 mg | ORAL_TABLET | Freq: Every evening | ORAL | 1 refills | Status: DC | PRN

## 2024-01-26 NOTE — Telephone Encounter (Signed)
 Copied from CRM [id number]. Topic: Clinical - Medical Advice >> Jan 26, 2024 10:01 AM Virgia Griffins wrote: Pt daughter calling with questions about her mothers current state. She does not like the behavior for one of the medications, and also sundowning. She's would like to know if there is anything avail for her mom to take to sleep at night.   Nurse attempted to reach out to patient: no answer: left message requesting callback.

## 2024-02-05 ENCOUNTER — Ambulatory Visit

## 2024-03-05 DIAGNOSIS — E119 Type 2 diabetes mellitus without complications: Secondary | ICD-10-CM | POA: Diagnosis not present

## 2024-03-05 DIAGNOSIS — K859 Acute pancreatitis without necrosis or infection, unspecified: Secondary | ICD-10-CM | POA: Diagnosis not present

## 2024-03-25 ENCOUNTER — Ambulatory Visit

## 2024-05-07 DIAGNOSIS — H401120 Primary open-angle glaucoma, left eye, stage unspecified: Secondary | ICD-10-CM | POA: Diagnosis not present

## 2024-05-07 DIAGNOSIS — H02401 Unspecified ptosis of right eyelid: Secondary | ICD-10-CM | POA: Diagnosis not present

## 2024-05-07 DIAGNOSIS — H04123 Dry eye syndrome of bilateral lacrimal glands: Secondary | ICD-10-CM | POA: Diagnosis not present

## 2024-05-07 DIAGNOSIS — H02142 Spastic ectropion of right lower eyelid: Secondary | ICD-10-CM | POA: Diagnosis not present

## 2024-05-07 DIAGNOSIS — H179 Unspecified corneal scar and opacity: Secondary | ICD-10-CM | POA: Diagnosis not present

## 2024-05-07 DIAGNOSIS — H401124 Primary open-angle glaucoma, left eye, indeterminate stage: Secondary | ICD-10-CM | POA: Diagnosis not present

## 2024-05-31 ENCOUNTER — Encounter: Payer: Self-pay | Admitting: Family Medicine

## 2024-05-31 ENCOUNTER — Ambulatory Visit (INDEPENDENT_AMBULATORY_CARE_PROVIDER_SITE_OTHER): Admitting: Family Medicine

## 2024-05-31 VITALS — BP 154/98 | HR 68 | Temp 98.5°F | Ht 62.0 in | Wt 126.0 lb

## 2024-05-31 DIAGNOSIS — F02C0 Dementia in other diseases classified elsewhere, severe, without behavioral disturbance, psychotic disturbance, mood disturbance, and anxiety: Secondary | ICD-10-CM

## 2024-05-31 DIAGNOSIS — G309 Alzheimer's disease, unspecified: Secondary | ICD-10-CM

## 2024-05-31 DIAGNOSIS — I10 Essential (primary) hypertension: Secondary | ICD-10-CM | POA: Diagnosis not present

## 2024-05-31 LAB — CBC WITH DIFFERENTIAL/PLATELET
Absolute Lymphocytes: 1866 {cells}/uL (ref 850–3900)
Absolute Monocytes: 496 {cells}/uL (ref 200–950)
Basophils Absolute: 37 {cells}/uL (ref 0–200)
Basophils Relative: 0.6 %
Eosinophils Absolute: 180 {cells}/uL (ref 15–500)
Eosinophils Relative: 2.9 %
HCT: 38.9 % (ref 35.0–45.0)
Hemoglobin: 12.1 g/dL (ref 11.7–15.5)
MCH: 26.2 pg — ABNORMAL LOW (ref 27.0–33.0)
MCHC: 31.1 g/dL — ABNORMAL LOW (ref 32.0–36.0)
MCV: 84.2 fL (ref 80.0–100.0)
MPV: 11.2 fL (ref 7.5–12.5)
Monocytes Relative: 8 %
Neutro Abs: 3621 {cells}/uL (ref 1500–7800)
Neutrophils Relative %: 58.4 %
Platelets: 237 Thousand/uL (ref 140–400)
RBC: 4.62 Million/uL (ref 3.80–5.10)
RDW: 17.8 % — ABNORMAL HIGH (ref 11.0–15.0)
Total Lymphocyte: 30.1 %
WBC: 6.2 Thousand/uL (ref 3.8–10.8)

## 2024-05-31 LAB — COMPREHENSIVE METABOLIC PANEL WITH GFR
AG Ratio: 1.3 (calc) (ref 1.0–2.5)
ALT: 9 U/L (ref 6–29)
AST: 15 U/L (ref 10–35)
Albumin: 3.8 g/dL (ref 3.6–5.1)
Alkaline phosphatase (APISO): 83 U/L (ref 37–153)
BUN/Creatinine Ratio: 23 (calc) — ABNORMAL HIGH (ref 6–22)
BUN: 28 mg/dL — ABNORMAL HIGH (ref 7–25)
CO2: 23 mmol/L (ref 20–32)
Calcium: 9.3 mg/dL (ref 8.6–10.4)
Chloride: 109 mmol/L (ref 98–110)
Creat: 1.24 mg/dL — ABNORMAL HIGH (ref 0.60–0.95)
Globulin: 2.9 g/dL (ref 1.9–3.7)
Glucose, Bld: 110 mg/dL — ABNORMAL HIGH (ref 65–99)
Potassium: 4.7 mmol/L (ref 3.5–5.3)
Sodium: 141 mmol/L (ref 135–146)
Total Bilirubin: 0.6 mg/dL (ref 0.2–1.2)
Total Protein: 6.7 g/dL (ref 6.1–8.1)
eGFR: 43 mL/min/1.73m2 — ABNORMAL LOW (ref >=60)

## 2024-05-31 LAB — LIPID PANEL
Cholesterol: 234 mg/dL — ABNORMAL HIGH (ref <200)
HDL: 59 mg/dL (ref >=50)
LDL Cholesterol (Calc): 153 mg/dL — ABNORMAL HIGH
Non-HDL Cholesterol (Calc): 175 mg/dL — ABNORMAL HIGH (ref <130)
Total CHOL/HDL Ratio: 4 (calc) (ref <5.0)
Triglycerides: 106 mg/dL (ref <150)

## 2024-05-31 LAB — VITAMIN B12: Vitamin B-12: 303 pg/mL (ref 200–1100)

## 2024-05-31 MED ORDER — AMLODIPINE BESYLATE 10 MG PO TABS
10.0000 mg | ORAL_TABLET | Freq: Every day | ORAL | 3 refills | Status: AC
Start: 2024-05-31 — End: ?

## 2024-05-31 NOTE — Progress Notes (Signed)
 Wt Readings from Last 3 Encounters:  05/31/24 126 lb (57.2 kg)  11/11/23 126 lb (57.2 kg)  11/10/23 126 lb (57.2 kg)     Subjective:    Patient ID: Claudia [name], female    DOB: 1941/01/06, 83 y.o.   MRN: [id number] Patient has dementia.  She was admitted to the hospital with pancreatitis about a year ago and had suffered considerable decline afterwards.  She was not eating.  She was losing weight.  She was enrolled in hospice.  However she has now been released by hospice because she is doing so well.  She is living at home with her daughter.  She is eating much better now.  She denies any abdominal pain.  She drinks very well.  She is having bowel movements every day.  Daughter denies any hematuria hematochezia melena.  Her weight is stable at 126 pounds.  Her blood pressure is elevated today.  They are not checking her blood pressure at home.  She is due for a flu shot but her daughter refuses a flu vaccine today.  She does experience sundowning.  She states up at night often from midnight until early morning.  However her daughter has been giving her lorazepam  twice a day during the day.  She sleeps often throughout the day.  Past Medical History:  Diagnosis Date   Anemia    Arthritis    B12 deficiency 01/11/2020   Blind right eye    Dementia (HCC)    Diabetes mellitus without complication (HCC)    Failure to thrive (child)    GERD (gastroesophageal reflux disease)    chronic gastritis   Hyperlipidemia    Hypertension    Pancreatitis    Past Surgical History:  Procedure Laterality Date   HERNIA REPAIR     INDUCED ABORTION     Current Outpatient Medications on File Prior to Visit  Medication Sig Dispense Refill   ascorbic acid (VITAMIN C) 1000 MG tablet Take 1,000 mg by mouth 2 (two) times daily.     atropine  1 % ophthalmic solution Place 1 drop into the right eye in the morning and at bedtime.     [facility] VITAMIN B12 1000 MCG tablet TOME UNA TABLETA TODOS LOS DIAS 200 tablet 5    LORazepam  (ATIVAN ) 0.5 MG tablet Take 1 tablet (0.5 mg total) by mouth at bedtime as needed for sleep. 30 tablet 1   losartan  (COZAAR ) 100 MG tablet TOMAR 1 TABLETA POR VIA ORAL CADA MANANA 90 tablet 11   megestrol  (MEGACE ) 400 MG/10ML suspension Take 10 mLs (400 mg total) by mouth daily. 300 mL 11   melatonin 5 MG TABS Take 1 tablet (5 mg total) by mouth at bedtime. 20 tablet 0   metoprolol  succinate (TOPROL -XL) 25 MG 24 hr tablet TOME UNA TABLETA POR VIA ORAL TODOS LOS DIAS 30 tablet 11   moxifloxacin (VIGAMOX) 0.5 % ophthalmic solution Place 1 drop into the right eye 3 (three) times daily.     pantoprazole  (PROTONIX ) 40 MG tablet TOMAR UNA (1) TABLETA POR VIA ORAL UNA VEZ AL DIA 30 tablet 2   pantoprazole  (PROTONIX ) 40 MG tablet Take 1 tablet (40 mg total) by mouth daily. 30 tablet 11   rosuvastatin  (CRESTOR ) 10 MG tablet TOMAR 1 TABLETA POR VIA ORAL UNA VEZ AL DIA 30 tablet 11   aspirin  81 MG tablet Take 1 tablet (81 mg total) by mouth daily. (Patient not taking: Reported on 05/31/2024) 30 tablet 11   mirtazapine  (REMERON ) 30  MG tablet Take 1 tablet (30 mg total) by mouth at bedtime. (Patient not taking: Reported on 05/31/2024) 30 tablet 6   No current facility-administered medications on file prior to visit.   No Known Allergies Social History   Socioeconomic History   Marital status: Single    Spouse name: Not on file   Number of children: 4   Years of education: Not on file   Highest education level: Not on file  Occupational History   Not on file  Tobacco Use   Smoking status: Former    Current packs/day: 0.00    Types: Cigarettes    Quit date: 30    Years since quitting: 44.8   Smokeless tobacco: Never  Vaping Use   Vaping status: Never Used  Substance and Sexual Activity   Alcohol use: No   Drug use: No   Sexual activity: Not Currently  Other Topics Concern   Not on file  Social History Narrative   Not on file   Social Drivers of Health   Financial Resource  Strain: Low Risk  (03/15/2021)   Overall Financial Resource Strain (CARDIA)    Difficulty of Paying Living Expenses: Not very hard  Food Insecurity: No Food Insecurity (11/12/2023)   Hunger Vital Sign    Worried About Running Out of Food in the Last Year: Never true    Ran Out of Food in the Last Year: Never true  Transportation Needs: No Transportation Needs (11/12/2023)   PRAPARE - Administrator, Civil Service (Medical): No    Lack of Transportation (Non-Medical): No  Physical Activity: Not on file  Stress: Not on file  Social Connections: Moderately Isolated (11/12/2023)   Social Connection and Isolation Panel    Frequency of Communication with Friends and Family: Never    Frequency of Social Gatherings with Friends and Family: More than three times a week    Attends Religious Services: Never    Database administrator or Organizations: Yes    Attends Banker Meetings: Never    Marital Status: Widowed  Intimate Partner Violence: Not At Risk (11/12/2023)   Humiliation, Afraid, Rape, and Kick questionnaire    Fear of Current or Ex-Partner: No    Emotionally Abused: No    Physically Abused: No    Sexually Abused: No     Review of Systems  All other systems reviewed and are negative.      Objective:   Physical Exam Vitals reviewed.  Constitutional:      Appearance: She is well-developed.  Eyes:     Conjunctiva/sclera: Conjunctivae normal.     Pupils: Pupils are equal, round, and reactive to light.  Neck:     Thyroid : No thyromegaly.     Vascular: No JVD.  Cardiovascular:     Rate and Rhythm: Normal rate and regular rhythm.     Heart sounds: Normal heart sounds. No murmur heard.    No friction rub. No gallop.  Pulmonary:     Effort: Pulmonary effort is normal. No respiratory distress.     Breath sounds: Normal breath sounds. No wheezing or rales.  Abdominal:     General: Bowel sounds are normal. There is no distension.     Palpations: Abdomen is  soft. There is no mass.     Tenderness: There is no abdominal tenderness. There is no guarding or rebound.  Musculoskeletal:     Cervical back: Neck supple.  Lymphadenopathy:     Cervical: No cervical  adenopathy.           Assessment & Plan:  Benign essential HTN - Plan: CBC with Differential/Platelet, Comprehensive metabolic panel with GFR, Lipid panel, Vitamin B12  Severe Alzheimer's dementia, unspecified timing of dementia onset, unspecified whether behavioral, psychotic, or mood disturbance or anxiety (HCC) - Plan: CBC with Differential/Platelet, Comprehensive metabolic panel with GFR, Lipid panel, Vitamin B12 Blood pressure is extremely high so I recommended starting amlodipine  10 mg a day and rechecking blood pressure in 2 weeks.  I will get a CBC and CMP a vitamin B12 and I will cancel the lipid panel that was previously ordered given her prognosis.  Recommended using lorazepam  at night to help her sleep and not getting it on a scheduled basis during the day which is likely causing her to sleep during the day worsening the sundowning at night.  Recommended a flu shot which the patient's daughter declines

## 2024-06-01 ENCOUNTER — Ambulatory Visit: Payer: Self-pay | Admitting: Family Medicine

## 2024-06-11 ENCOUNTER — Ambulatory Visit: Payer: Self-pay

## 2024-06-11 NOTE — Telephone Encounter (Signed)
 FYI Only or Action Required?: Action required by provider: clinical question for provider.  Patient was last seen in primary care on 05/31/2024 by Duanne Butler DASEN, MD.  Called Nurse Triage reporting Advice Only.  Symptoms began information only call.  Interventions attempted: Other: information only call.  Symptoms are: information only call.  Triage Disposition: Call PCP When Office is Open  Patient/caregiver understands and will follow disposition?: Yes                      Reason for Disposition  [1] Caller requesting NON-URGENT health information AND [2] PCP's office is the best resource  Answer Assessment - Initial Assessment Questions 1. REASON FOR CALL: What is the main reason for your call? or How can I best help you?     This RN spoke to patient's daughter, Claudia [name] (on [place]). Claudia [name] wanted to report to recent BP readings of 116/66 (today) and 123/63 (yesterday) to provider. Patient is not experiencing symptoms at this time. Claudia [name] would like provider's specific recommendation on what patient's BP should be ranging between. Please advise.  Protocols used: Information Only Call - No Triage-A-AH

## 2024-06-11 NOTE — Telephone Encounter (Signed)
 This RN made first attempt to triage patient with interpreter on the line. No answer, LVM. Routing for additional attempts.   Copied from CRM #[id number]. Topic: Clinical - Medical Advice >> Jun 11, 2024  9:58 AM Montie POUR wrote: Reason for CRM:  Dr. Duanne wanted daughter, Maddox, to call on report mom's blood pressure. Her blood pressure is 116/66.  Please have a nurse call Areen at [phone] to discuss what her blood pressure should be and when to give her medication.

## 2024-06-21 ENCOUNTER — Ambulatory Visit: Payer: Self-pay

## 2024-06-21 NOTE — Telephone Encounter (Signed)
 FYI Only or Action Required?: FYI only for provider: appointment scheduled on 06/22/24.  Patient was last seen in primary care on 05/31/2024 by Duanne Butler DASEN, MD.  Called Nurse Triage reporting Joint Swelling and Foot Swelling.  Symptoms began a week ago.  Interventions attempted: Rest, hydration, or home remedies.  Symptoms are: stable.  Triage Disposition: See PCP When Office is Open (Within 3 Days)  Patient/caregiver understands and will follow disposition?: Yes                            Copied from CRM [id number]. Topic: Clinical - Red Word Triage >> Jun 21, 2024 11:23 AM Lonell PEDLAR wrote: Red Word that prompted transfer to Nurse Triage: Patient's daughter, janavia is calling due to swelling feet and ankles, she believes this is a reaction to the new blood pressure medication, amLODipine  (NORVASC ) 10 MG tablet  Reason for Disposition  [1] MILD swelling of both ankles (i.e., pedal edema) AND [2] new-onset or getting worse  Answer Assessment - Initial Assessment Questions 1. ONSET: When did the swelling start? (e.g., minutes, hours, days)     Last week 2. LOCATION: What part of the leg is swollen?  Are both legs swollen or just one leg?     Bilateral feet and ankles 3. SEVERITY: How bad is the swelling? (e.g., localized; mild, moderate, severe)     Mild, localized 4. REDNESS: Is there redness or signs of infection?     Denies 5. PAIN: Is the swelling painful to touch? If Yes, ask: How painful is it?   (Scale 1-10; mild, moderate or severe)     Daughter states patient has reported pain, but states patient also tends to describe pain inaccurately  6. FEVER: Do you have a fever? If Yes, ask: What is it, how was it measured, and when did it start?      Denies 7. CAUSE: What do you think is causing the leg swelling?     Patient started amlodipine  on 05/31/24 8. MEDICAL HISTORY: Do you have a history of blood clots (e.g., DVT), cancer,  heart failure, kidney disease, or liver failure?     Denies history of DVT, denies history of HF 9. RECURRENT SYMPTOM: Have you had leg swelling before? If Yes, ask: When was the last time? What happened that time?     Denies 10. OTHER SYMPTOMS: Do you have any other symptoms? (e.g., chest pain, difficulty breathing)     Denies chest pain, denies difficulty breathing 11. PREGNANCY: Is there any chance you are pregnant? When was your last menstrual period?     N/A  Protocols used: Leg Swelling and Edema-A-AH

## 2024-06-22 ENCOUNTER — Ambulatory Visit: Admitting: Family Medicine

## 2024-07-02 ENCOUNTER — Ambulatory Visit: Admitting: Family Medicine

## 2024-07-02 ENCOUNTER — Encounter: Payer: Self-pay | Admitting: Family Medicine

## 2024-07-02 VITALS — BP 140/82 | HR 91 | Temp 98.1°F | Ht 62.0 in | Wt 126.0 lb

## 2024-07-02 DIAGNOSIS — I10 Essential (primary) hypertension: Secondary | ICD-10-CM

## 2024-07-02 NOTE — Progress Notes (Signed)
 Wt Readings from Last 3 Encounters:  07/02/24 126 lb (57.2 kg)  05/31/24 126 lb (57.2 kg)  11/11/23 126 lb (57.2 kg)     Subjective:    Patient ID: Claudia [name], female    DOB: 07-Mar-1941, 83 y.o.   MRN: [id number]  When I last saw the patient I started her on amlodipine  for hypertension.  Afterward, the daughter noticed swelling in both legs distal to the knees.  She states that the swelling was so bad that the patient was complaining about pain.  Hence she discontinued the amlodipine .  Today on exam, despite not taking the amlodipine , her blood pressure is acceptable at 140/82.  Daughter indicates that her blood pressure is similar at home.  However, there is no swelling in her ankles or in her feet or in her distal legs.  There is no pitting edema.  Daughter states that this improves once she stopped amlodipine .  There is some mild swelling around both knees but this seems to be a periarticular effusion related to arthritis.  There is no erythema or warmth and the patient has no pain with passive range of motion in both knees.  Past Medical History:  Diagnosis Date  . Anemia   . Arthritis   . B12 deficiency 01/11/2020  . Blind right eye   . Dementia (HCC)   . Diabetes mellitus without complication (HCC)   . Failure to thrive (child)   . GERD (gastroesophageal reflux disease)    chronic gastritis  . Hyperlipidemia   . Hypertension   . Pancreatitis    Past Surgical History:  Procedure Laterality Date  . HERNIA REPAIR    . INDUCED ABORTION     Current Outpatient Medications on File Prior to Visit  Medication Sig Dispense Refill  . amLODipine  (NORVASC ) 10 MG tablet Take 1 tablet (10 mg total) by mouth daily. 90 tablet 3  . ascorbic acid (VITAMIN C) 1000 MG tablet Take 1,000 mg by mouth 2 (two) times daily. (Patient not taking: Reported on 05/31/2024)    . aspirin  81 MG tablet Take 1 tablet (81 mg total) by mouth daily. (Patient not taking: Reported on 05/31/2024) 30 tablet 11  .  atropine  1 % ophthalmic solution Place 1 drop into the right eye in the morning and at bedtime.    . [facility] VITAMIN B12 1000 MCG tablet TOME UNA TABLETA TODOS LOS DIAS (Patient not taking: Reported on 05/31/2024) 200 tablet 5  . LORazepam  (ATIVAN ) 0.5 MG tablet Take 1 tablet (0.5 mg total) by mouth at bedtime as needed for sleep. 30 tablet 1  . losartan  (COZAAR ) 100 MG tablet TOMAR 1 TABLETA POR VIA ORAL CADA MANANA (Patient not taking: Reported on 05/31/2024) 90 tablet 11  . megestrol  (MEGACE ) 400 MG/10ML suspension Take 10 mLs (400 mg total) by mouth daily. (Patient not taking: Reported on 05/31/2024) 300 mL 11  . melatonin 5 MG TABS Take 1 tablet (5 mg total) by mouth at bedtime. (Patient not taking: Reported on 05/31/2024) 20 tablet 0  . metoprolol  succinate (TOPROL -XL) 25 MG 24 hr tablet TOME UNA TABLETA POR VIA ORAL TODOS LOS DIAS (Patient not taking: Reported on 05/31/2024) 30 tablet 11  . mirtazapine  (REMERON ) 30 MG tablet Take 1 tablet (30 mg total) by mouth at bedtime. (Patient not taking: Reported on 05/31/2024) 30 tablet 6  . moxifloxacin (VIGAMOX) 0.5 % ophthalmic solution Place 1 drop into the right eye 3 (three) times daily.    . pantoprazole  (PROTONIX ) 40 MG tablet TOMAR  UNA (1) TABLETA POR VIA ORAL UNA VEZ AL DIA 30 tablet 2  . pantoprazole  (PROTONIX ) 40 MG tablet Take 1 tablet (40 mg total) by mouth daily. 30 tablet 11  . rosuvastatin  (CRESTOR ) 10 MG tablet TOMAR 1 TABLETA POR VIA ORAL UNA VEZ AL DIA (Patient not taking: Reported on 05/31/2024) 30 tablet 11   No current facility-administered medications on file prior to visit.   No Known Allergies Social History   Socioeconomic History  . Marital status: Single    Spouse name: Not on file  . Number of children: 4  . Years of education: Not on file  . Highest education level: Not on file  Occupational History  . Not on file  Tobacco Use  . Smoking status: Former    Current packs/day: 0.00    Types: Cigarettes    Quit date:  1981    Years since quitting: 44.9  . Smokeless tobacco: Never  Vaping Use  . Vaping status: Never Used  Substance and Sexual Activity  . Alcohol use: No  . Drug use: No  . Sexual activity: Not Currently  Other Topics Concern  . Not on file  Social History Narrative  . Not on file   Social Drivers of Health   Financial Resource Strain: Low Risk  (03/15/2021)   Overall Financial Resource Strain (CARDIA)   . Difficulty of Paying Living Expenses: Not very hard  Food Insecurity: No Food Insecurity (11/12/2023)   Hunger Vital Sign   . Worried About Programme Researcher, Broadcasting/film/video in the Last Year: Never true   . Ran Out of Food in the Last Year: Never true  Transportation Needs: No Transportation Needs (11/12/2023)   PRAPARE - Transportation   . Lack of Transportation (Medical): No   . Lack of Transportation (Non-Medical): No  Physical Activity: Not on file  Stress: Not on file  Social Connections: Moderately Isolated (11/12/2023)   Social Connection and Isolation Panel   . Frequency of Communication with Friends and Family: Never   . Frequency of Social Gatherings with Friends and Family: More than three times a week   . Attends Religious Services: Never   . Active Member of Clubs or Organizations: Yes   . Attends Banker Meetings: Never   . Marital Status: Widowed  Intimate Partner Violence: Not At Risk (11/12/2023)   Humiliation, Afraid, Rape, and Kick questionnaire   . Fear of Current or Ex-Partner: No   . Emotionally Abused: No   . Physically Abused: No   . Sexually Abused: No     Review of Systems  All other systems reviewed and are negative.      Objective:   Physical Exam Vitals reviewed.  Constitutional:      Appearance: She is well-developed.  Eyes:     Conjunctiva/sclera: Conjunctivae normal.     Pupils: Pupils are equal, round, and reactive to light.  Neck:     Thyroid : No thyromegaly.     Vascular: No JVD.  Cardiovascular:     Rate and Rhythm: Normal rate  and regular rhythm.     Heart sounds: Normal heart sounds. No murmur heard.    No friction rub. No gallop.  Pulmonary:     Effort: Pulmonary effort is normal. No respiratory distress.     Breath sounds: Normal breath sounds. No wheezing or rales.  Abdominal:     General: Bowel sounds are normal. There is no distension.     Palpations: Abdomen is soft. There is  no mass.     Tenderness: There is no abdominal tenderness. There is no guarding or rebound.  Musculoskeletal:     Cervical back: Neck supple.     Right lower leg: No edema.     Left lower leg: No edema.  Lymphadenopathy:     Cervical: No cervical adenopathy.           Assessment & Plan:  Benign essential HTN There is no evidence of congestive heart failure today on exam.  I believe the amlodipine  10 mg a day was causing the swelling.  It has resolved after the patient discontinue the amlodipine .  There is no peripheral edema today on exam.  Fortunately her blood pressure does not appear elevated today.  Therefore I will not replace the amlodipine  with any additional medication.  We will simply monitor to see if the swelling returns.  I have asked the patient's daughter to monitor her blood pressure as well

## 2024-07-16 ENCOUNTER — Other Ambulatory Visit: Payer: Self-pay | Admitting: Family Medicine

## 2024-07-16 NOTE — Telephone Encounter (Unsigned)
 Copied from CRM #[id number]. Topic: Clinical - Medication Refill >> Jul 16, 2024 11:14 AM Everette C wrote: Medication: LORazepam  (ATIVAN ) 0.5 MG tablet [[id number]]  Has the patient contacted their pharmacy? Yes (Agent: If no, request that the patient contact the pharmacy for the refill. If patient does not wish to contact the pharmacy document the reason why and proceed with request.) (Agent: If yes, when and what did the pharmacy advise?)  This is the patient's preferred pharmacy:  [id number] [place], [place] - [address] [place] [address] Phone: [phone] Fax: [phone]  Is this the correct pharmacy for this prescription? Yes If no, delete pharmacy and type the correct one.   Has the prescription been filled recently? No  Is the patient out of the medication? No  Has the patient been seen for an appointment in the last year OR does the patient have an upcoming appointment? Yes  Can we respond through MyChart? No  Agent: Please be advised that Rx refills may take up to 3 business days. We ask that you follow-up with your pharmacy.

## 2024-07-19 ENCOUNTER — Telehealth: Payer: Self-pay

## 2024-07-19 ENCOUNTER — Other Ambulatory Visit: Payer: Self-pay

## 2024-07-19 MED ORDER — LORAZEPAM 0.5 MG PO TABS: 0.5000 mg | ORAL_TABLET | Freq: Every evening | ORAL | 1 refills | Status: AC | PRN

## 2024-07-19 NOTE — Telephone Encounter (Signed)
Sent to PCP to sign

## 2024-07-19 NOTE — Telephone Encounter (Unsigned)
 Copied from CRM #[id number]. Topic: Clinical - Medication Refill >> Jul 16, 2024 11:14 AM Everette C wrote: Medication: LORazepam  (ATIVAN ) 0.5 MG tablet [[id number]]  Has the patient contacted their pharmacy? Yes (Agent: If no, request that the patient contact the pharmacy for the refill. If patient does not wish to contact the pharmacy document the reason why and proceed with request.) (Agent: If yes, when and what did the pharmacy advise?)  This is the patient's preferred pharmacy:  [id number] [place], [place] - [address] [place] [address] Phone: [phone] Fax: [phone]  Is this the correct pharmacy for this prescription? Yes If no, delete pharmacy and type the correct one.   Has the prescription been filled recently? No  Is the patient out of the medication? No  Has the patient been seen for an appointment in the last year OR does the patient have an upcoming appointment? Yes  Can we respond through MyChart? No  Agent: Please be advised that Rx refills may take up to 3 business days. We ask that you follow-up with your pharmacy. >> Jul 19, 2024 10:26 AM Rea ORN wrote: Pt daughter Urijah calling to check status of this rx. She stated her mother has sundowners and this is the only thing that helps. She has 2 tablets left, which will only last today. I did advise her that it can take up to 3 business days to process a rx request. She is asking for someone to advise when this will be sent in.

## 2024-07-28 ENCOUNTER — Ambulatory Visit (INDEPENDENT_AMBULATORY_CARE_PROVIDER_SITE_OTHER)

## 2024-07-28 VITALS — Ht 62.0 in | Wt 126.0 lb

## 2024-07-28 DIAGNOSIS — Z0000 Encounter for general adult medical examination without abnormal findings: Secondary | ICD-10-CM | POA: Diagnosis not present

## 2024-07-28 NOTE — Patient Instructions (Signed)
 Ms. Goshert,  Thank you for taking the time for your Medicare Wellness Visit. I appreciate your continued commitment to your health goals. Please review the care plan we discussed, and feel free to reach out if I can assist you further.  Please note that Annual Wellness Visits do not include a physical exam. Some assessments may be limited, especially if the visit was conducted virtually. If needed, we may recommend an in-person follow-up with your provider.  Ongoing Care Seeing your primary care provider every 3 to 6 months helps us  monitor your health and provide consistent, personalized care.   Referrals If a referral was made during today's visit and you haven't received any updates within two weeks, please contact the referred provider directly to check on the status.  Recommended Screenings:  Health Maintenance  Topic Date Due   COVID-19 Vaccine (1) Never done   DTaP/Tdap/Td vaccine (1 - Tdap) Never done   Zoster (Shingles) Vaccine (1 of 2) Never done   Complete foot exam   05/14/2023   Yearly kidney health urinalysis for diabetes  11/15/2023   Hemoglobin A1C  05/14/2024   Flu Shot  11/09/2024*   Yearly kidney function blood test for diabetes  05/31/2025   Medicare Annual Wellness Visit  07/28/2025   Pneumococcal Vaccine for age over 27  Completed   Meningitis B Vaccine  Aged Out  *Topic was postponed. The date shown is not the original due date.       07/28/2024    2:02 PM  Advanced Directives  Does Patient Have a Medical Advance Directive? Yes  Type of Estate Agent of [place];Out of facility DNR (pink MOST or yellow form)  Does patient want to make changes to medical advance directive? Yes (MAU/Ambulatory/Procedural Areas - Information given)  Copy of Healthcare Power of Attorney in Chart? Yes - validated most recent copy scanned in chart (See row information)  Pre-existing out of facility DNR order (yellow form or pink MOST form) Yellow form  placed in chart (order not valid for inpatient use)    Vision: Annual vision screenings are recommended for early detection of glaucoma, cataracts, and diabetic retinopathy. These exams can also reveal signs of chronic conditions such as diabetes and high blood pressure.  Dental: Annual dental screenings help detect early signs of oral cancer, gum disease, and other conditions linked to overall health, including heart disease and diabetes.  Please see the attached documents for additional preventive care recommendations.

## 2024-07-28 NOTE — Progress Notes (Signed)
 Chief Complaint  Patient presents with   Medicare Wellness     Subjective:   Claudia [name] is a 83 y.o. female who presents for a Medicare Annual Wellness Visit.  Visit info / Clinical Intake: Medicare Wellness Visit Type:: Initial Annual Wellness Visit Persons participating in visit and providing information:: caregiver (with patient present) (daughterGlorine [name]) Medicare Wellness Visit Mode:: Telephone If telephone:: video declined Since this visit was completed virtually, some vitals may be partially provided or unavailable. Missing vitals are due to the limitations of the virtual format.: Documented vitals are patient reported If Telephone or Video please confirm:: I connected with patient using audio/video enable telemedicine. I verified patient identity with two identifiers, discussed telehealth limitations, and patient agreed to proceed. Patient Location:: home Provider Location:: office Interpreter Needed?: Yes Interpreter Agency: [facility] Interpreter Name: Claudia [name] Patient Declined Interpreter : No Interpretation [name] provided by: Claudia [name] Interpreter Patient signed Claudia [name] waiver: No Pre-visit prep was completed: yes AWV questionnaire completed by patient prior to visit?: no Living arrangements:: with family/others Patient's Overall Health Status Rating: (!) fair Typical amount of pain: some Does pain affect daily life?: no Are you currently prescribed opioids?: no  Dietary Habits and Nutritional Risks How many meals a day?: 3 Eats fruit and vegetables daily?: yes Most meals are obtained by: having others provide food In the last 2 weeks, have you had any of the following?: none Diabetic:: (!) yes Any non-healing wounds?: no How often do you check your BS?: 0 Would you like to be referred to a Nutritionist or for Diabetic Management? : no  Functional Status Activities of Daily Living (to include ambulation/medication): (!) Needs Assist Feeding:  Independent Dressing/Grooming: Needs assistance Bathing: Needs assistance Toileting: Needs assistance Transfer: Needs assistance Ambulation: Independent with device- listed below Home Assistive Devices/Equipment: Walker (specify Type); Cane; Beside Commode Medication Administration: Dependent Is this a change from baseline?: Pre-admission baseline Home Management (perform basic housework or laundry): Dependent Manage your own finances?: (!) no Primary transportation is: family / friends Concerns about vision?: no *vision screening is required for WTM* Concerns about hearing?: (!) yes Uses hearing aids?: no  Fall Screening Falls in the past year?: 1 Number of falls in past year: 0 Was there an injury with Fall?: 0 Fall Risk Category Calculator: 1 Patient Fall Risk Level: Low Fall Risk  Fall Risk Patient at Risk for Falls Due to: Impaired mobility; Impaired balance/gait; History of fall(s) Fall risk Follow up: Falls evaluation completed; Education provided; Falls prevention discussed  Home and Transportation Safety: All rugs have non-skid backing?: yes All stairs or steps have railings?: yes Grab bars in the bathtub or shower?: yes Have non-skid surface in bathtub or shower?: yes Good home lighting?: yes Regular seat belt use?: yes Hospital stays in the last year:: (!) yes How many hospital stays:: 2 Reason: Abdominal Pain; Procedure  Cognitive Assessment Difficulty concentrating, remembering, or making decisions? : yes Will 6CIT or Mini Cog be Completed: no 6CIT or Mini Cog Declined: patient has a diagnosis of dementia or cognitive impairment  Advance Directives (For Healthcare) Does Patient Have a Medical Advance Directive?: Yes Does patient want to make changes to medical advance directive?: Yes (MAU/Ambulatory/Procedural Areas - Information given) Type of Advance Directive: Healthcare Power of [place]; Out of facility DNR (pink MOST or yellow form) Copy of Healthcare  Power of Attorney in Chart?: Yes - validated most recent copy scanned in chart (See row information) Out of facility DNR (pink MOST or yellow form)  in Chart? (Ambulatory ONLY): Yes - validated most recent copy scanned in chart Pre-existing out of facility DNR order (yellow form or pink MOST form): Yellow form placed in chart (order not valid for inpatient use) Would patient like information on creating a medical advance directive?: Yes (Inpatient - patient requests chaplain consult to create a medical advance directive)  Reviewed/Updated  Reviewed/Updated: Reviewed All (Medical, Surgical, Family, Medications, Allergies, Care Teams, Patient Goals)    Allergies (verified) Patient has no known allergies.   Current Medications (verified) Outpatient Encounter Medications as of 07/28/2024  Medication Sig   LORazepam  (ATIVAN ) 0.5 MG tablet Take 1 tablet (0.5 mg total) by mouth at bedtime as needed for sleep.   amLODipine  (NORVASC ) 10 MG tablet Take 1 tablet (10 mg total) by mouth daily.   ascorbic acid (VITAMIN C) 1000 MG tablet Take 1,000 mg by mouth 2 (two) times daily. (Patient not taking: Reported on 05/31/2024)   aspirin  81 MG tablet Take 1 tablet (81 mg total) by mouth daily. (Patient not taking: Reported on 07/28/2024)   atropine  1 % ophthalmic solution Place 1 drop into the right eye in the morning and at bedtime.   [facility] VITAMIN B12 1000 MCG tablet TOME UNA TABLETA TODOS LOS DIAS (Patient not taking: Reported on 05/31/2024)   losartan  (COZAAR ) 100 MG tablet TOMAR 1 TABLETA POR VIA ORAL CADA MANANA (Patient not taking: Reported on 05/31/2024)   megestrol  (MEGACE ) 400 MG/10ML suspension Take 10 mLs (400 mg total) by mouth daily. (Patient not taking: Reported on 07/28/2024)   melatonin 5 MG TABS Take 1 tablet (5 mg total) by mouth at bedtime. (Patient not taking: Reported on 05/31/2024)   metoprolol  succinate (TOPROL -XL) 25 MG 24 hr tablet TOME UNA TABLETA POR VIA ORAL TODOS LOS DIAS (Patient  not taking: Reported on 05/31/2024)   mirtazapine  (REMERON ) 30 MG tablet Take 1 tablet (30 mg total) by mouth at bedtime. (Patient not taking: Reported on 07/28/2024)   moxifloxacin (VIGAMOX) 0.5 % ophthalmic solution Place 1 drop into the right eye 3 (three) times daily.   pantoprazole  (PROTONIX ) 40 MG tablet TOMAR UNA (1) TABLETA POR VIA ORAL UNA VEZ AL DIA (Patient not taking: Reported on 07/28/2024)   pantoprazole  (PROTONIX ) 40 MG tablet Take 1 tablet (40 mg total) by mouth daily.   rosuvastatin  (CRESTOR ) 10 MG tablet TOMAR 1 TABLETA POR VIA ORAL UNA VEZ AL DIA (Patient not taking: Reported on 05/31/2024)   No facility-administered encounter medications on file as of 07/28/2024.    History: Past Medical History:  Diagnosis Date   Anemia    Arthritis    B12 deficiency 01/11/2020   Blind right eye    Dementia (HCC)    Diabetes mellitus without complication (HCC)    Failure to thrive (child)    GERD (gastroesophageal reflux disease)    chronic gastritis   Hyperlipidemia    Hypertension    Pancreatitis    Past Surgical History:  Procedure Laterality Date   HERNIA REPAIR     INDUCED ABORTION     Family History  Problem Relation Age of Onset   Diabetes Sister    Diabetes Daughter    Social History   Occupational History   Not on file  Tobacco Use   Smoking status: Former    Current packs/day: 0.00    Types: Cigarettes    Quit date: 1981    Years since quitting: 44.9   Smokeless tobacco: Never  Vaping Use   Vaping status: Never Used  Substance and Sexual Activity   Alcohol use: No   Drug use: No   Sexual activity: Not Currently   Tobacco Counseling Counseling given: Not Answered  SDOH Screenings   Food Insecurity: No Food Insecurity (11/12/2023)  Housing: Low Risk (11/12/2023)  Transportation Needs: No Transportation Needs (11/12/2023)  Utilities: Not At Risk (11/12/2023)  Depression (PHQ2-9): Low Risk (07/28/2024)  Physical Activity: Patient Unable To Answer  (07/28/2024)  Social Connections: Patient Unable To Answer (07/28/2024)  Stress: Patient Unable To Answer (07/28/2024)  Tobacco Use: Medium Risk (07/28/2024)   See flowsheets for full screening details  Depression Screen Depression Screening Exception Documentation Depression Screening Exception:: Medical reason (cognitive impairment)  PHQ 2 & 9 Depression Scale- Over the past 2 weeks, how often have you been bothered by any of the following problems? Little interest or pleasure in doing things: 0 Feeling down, depressed, or hopeless (PHQ Adolescent also includes...irritable): 0 PHQ-2 Total Score: 0     Goals Addressed             This Visit's Progress    Prevent falls               Objective:    Today's Vitals   07/28/24 1431  Weight: 126 lb (57.2 kg)  Height: 5' 2 (1.575 m)   Body mass index is 23.05 kg/m.  Hearing/Vision screen Hearing Screening - Comments:: Some hearing impairment  Vision Screening - Comments:: Up to date with routine eye exams with [facility]  Immunizations and Health Maintenance Health Maintenance  Topic Date Due   COVID-19 Vaccine (1) Never done   DTaP/Tdap/Td (1 - Tdap) Never done   Zoster Vaccines- Shingrix (1 of 2) Never done   Bone Density Scan  Never done   FOOT EXAM  05/14/2023   Diabetic kidney evaluation - Urine ACR  11/15/2023   HEMOGLOBIN A1C  05/14/2024   Influenza Vaccine  11/09/2024 (Originally 03/12/2024)   OPHTHALMOLOGY EXAM  05/07/2025   Diabetic kidney evaluation - eGFR measurement  05/31/2025   Medicare Annual Wellness (AWV)  07/28/2025   Pneumococcal Vaccine: 50+ Years  Completed   Meningococcal B Vaccine  Aged Out        Assessment/Plan:  This is a routine wellness examination for [facility].  Patient Care Team: Duanne Butler DASEN, MD as PCP - General (Family Medicine) Coleridge Dusty CROME, MD (Ophthalmology)  I have personally reviewed and noted the following in the  patients chart:   Medical and social history Use of alcohol, tobacco or illicit drugs  Current medications and supplements including opioid prescriptions. Functional ability and status Nutritional status Physical activity Advanced directives List of other physicians Hospitalizations, surgeries, and ER visits in previous 12 months Vitals Screenings to include cognitive, depression, and falls Referrals and appointments  No orders of the defined types were placed in this encounter.  In addition, I have reviewed and discussed with patient certain preventive protocols, quality metrics, and best practice recommendations. A written personalized care plan for preventive [name] as well as general preventive health recommendations were provided to patient.   Claudia Charmaine Browner, LPN   [id number]/7974   Return in 1 year (on 07/28/2025).  After Visit Summary: (Pick Up) Due to this being a telephonic visit, with patients personalized plan was offered to patient and patient has requested to Pick up at office.  Nurse Notes: Patient scheduled for office visit on 07/29/24 due to having vaso vagal episode Tuesday (07/27/24) while on the  toilet and EMS was called.  Patient began sweating and vomited x 1.

## 2024-07-29 ENCOUNTER — Ambulatory Visit: Admitting: Family Medicine

## 2024-07-29 ENCOUNTER — Encounter: Payer: Self-pay | Admitting: Family Medicine

## 2024-07-29 VITALS — BP 120/70 | HR 66 | Temp 98.3°F | Ht 62.0 in | Wt 148.2 lb

## 2024-07-29 DIAGNOSIS — R55 Syncope and collapse: Secondary | ICD-10-CM

## 2024-07-29 NOTE — Progress Notes (Signed)
 Subjective:    Patient ID: Claudia [name], female    DOB: [birth year], 83 y.o.   MRN: [id number]  Yesterday, the patient was complaining that her belly hurt.  She was sitting on the toilet.  She was straining to have a bowel movement.  Her daughter states that she had a hard bowel movement with several hard balls but no blood and no melena.  Right after straining to have a hard bowel movement, the patient states she felt lightheaded and nauseated.  She then blacked out for just a few seconds.  The daughter was with her and was able to catch her and prevent any fall.  She then applied a cool rag to the patient's face and the patient woke up immediately.  Because of the sweating and the passing out, she contacted 911.  The patient denied any chest pain or shortness of breath.  Upon arrival, EMS confirmed normal vital signs and a normal blood sugar.  In fact the blood sugar was slightly elevated.  Vital signs today are completely normal.  Patient denies any cough or chest pain or shortness of breath or dysuria or abdominal pain  Past Medical History:  Diagnosis Date   Anemia    Arthritis    B12 deficiency 01/11/2020   Blind right eye    Dementia (HCC)    Diabetes mellitus without complication (HCC)    Failure to thrive (child)    GERD (gastroesophageal reflux disease)    chronic gastritis   Hyperlipidemia    Hypertension    Pancreatitis    Past Surgical History:  Procedure Laterality Date   HERNIA REPAIR     INDUCED ABORTION     Current Outpatient Medications on File Prior to Visit  Medication Sig Dispense Refill   amLODipine  (NORVASC ) 10 MG tablet Take 1 tablet (10 mg total) by mouth daily. 90 tablet 3   ascorbic acid (VITAMIN C) 1000 MG tablet Take 1,000 mg by mouth 2 (two) times daily. (Patient not taking: Reported on 05/31/2024)     aspirin  81 MG tablet Take 1 tablet (81 mg total) by mouth daily. (Patient not taking: Reported on 07/28/2024) 30 tablet 11   atropine  1 % ophthalmic  solution Place 1 drop into the right eye in the morning and at bedtime.     [facility]S VITAMIN B12 1000 MCG tablet TOME UNA TABLETA TODOS LOS DIAS (Patient not taking: Reported on 05/31/2024) 200 tablet 5   LORazepam  (ATIVAN ) 0.5 MG tablet Take 1 tablet (0.5 mg total) by mouth at bedtime as needed for sleep. 30 tablet 1   losartan  (COZAAR ) 100 MG tablet TOMAR 1 TABLETA POR VIA ORAL CADA MANANA (Patient not taking: Reported on 05/31/2024) 90 tablet 11   megestrol  (MEGACE ) 400 MG/10ML suspension Take 10 mLs (400 mg total) by mouth daily. (Patient not taking: Reported on 07/28/2024) 300 mL 11   melatonin 5 MG TABS Take 1 tablet (5 mg total) by mouth at bedtime. (Patient not taking: Reported on 05/31/2024) 20 tablet 0   metoprolol  succinate (TOPROL -XL) 25 MG 24 hr tablet TOME UNA TABLETA POR VIA ORAL TODOS LOS DIAS (Patient not taking: Reported on 05/31/2024) 30 tablet 11   mirtazapine  (REMERON ) 30 MG tablet Take 1 tablet (30 mg total) by mouth at bedtime. (Patient not taking: Reported on 07/28/2024) 30 tablet 6   moxifloxacin (VIGAMOX) 0.5 % ophthalmic solution Place 1 drop into the right eye 3 (three) times daily.     pantoprazole  (PROTONIX ) 40 MG tablet TOMAR  UNA (1) TABLETA POR VIA ORAL UNA VEZ AL DIA (Patient not taking: Reported on 07/28/2024) 30 tablet 2   pantoprazole  (PROTONIX ) 40 MG tablet Take 1 tablet (40 mg total) by mouth daily. 30 tablet 11   rosuvastatin  (CRESTOR ) 10 MG tablet TOMAR 1 TABLETA POR VIA ORAL UNA VEZ AL DIA (Patient not taking: Reported on 05/31/2024) 30 tablet 11   No current facility-administered medications on file prior to visit.   No Known Allergies Social History   Socioeconomic History   Marital status: Single    Spouse name: Not on file   Number of children: 4   Years of education: Not on file   Highest education level: Not on file  Occupational History   Not on file  Tobacco Use   Smoking status: Former    Current packs/day: 0.00    Types: Cigarettes    Quit  date: 56    Years since quitting: 44.9   Smokeless tobacco: Never  Vaping Use   Vaping status: Never Used  Substance and Sexual Activity   Alcohol use: No   Drug use: No   Sexual activity: Not Currently  Other Topics Concern   Not on file  Social History Narrative   Not on file   Social Drivers of Health   Tobacco Use: Medium Risk (07/28/2024)   Patient History    Smoking Tobacco Use: Former    Smokeless Tobacco Use: Never    Passive Exposure: Not on Actuary Strain: Not on file  Food Insecurity: No Food Insecurity (11/12/2023)   Hunger Vital Sign    Worried About Running Out of Food in the Last Year: Never true    Ran Out of Food in the Last Year: Never true  Transportation Needs: No Transportation Needs (11/12/2023)   PRAPARE - Administrator, Civil Service (Medical): No    Lack of Transportation (Non-Medical): No  Physical Activity: Patient Unable To Answer (07/28/2024)   Exercise Vital Sign    Days of Exercise per Week: Patient unable to answer    Minutes of Exercise per Session: Patient unable to answer  Stress: Patient Unable To Answer (07/28/2024)   [employer] of Occupational Health - Occupational Stress Questionnaire    Feeling of Stress: Patient unable to answer  Social Connections: Patient Unable To Answer (07/28/2024)   Social Connection and Isolation Panel    Frequency of Communication with Friends and Family: Patient unable to answer    Frequency of Social Gatherings with Friends and Family: Patient unable to answer    Attends Religious Services: Patient unable to answer    Active Member of Clubs or Organizations: Patient unable to answer    Attends Banker Meetings: Patient unable to answer    Marital Status: Patient unable to answer  Intimate Partner Violence: Not At Risk (11/12/2023)   Humiliation, Afraid, Rape, and Kick questionnaire    Fear of Current or Ex-Partner: No    Emotionally Abused: No    Physically  Abused: No    Sexually Abused: No  Depression (PHQ2-9): Low Risk (07/28/2024)   Depression (PHQ2-9)    PHQ-2 Score: 0  Alcohol Screen: Not on file  Housing: Low Risk (11/12/2023)   Housing Stability Vital Sign    Unable to Pay for Housing in the Last Year: No    Number of Times Moved in the Last Year: 0    Homeless in the Last Year: No  Utilities: Not At Risk (11/12/2023)  [employer] Utilities    Threatened with loss of utilities: No  Health Literacy: Not on file     Review of Systems  All other systems reviewed and are negative.      Objective:   Physical Exam Vitals reviewed.  Constitutional:      Appearance: She is well-developed.  Eyes:     Conjunctiva/sclera: Conjunctivae normal.     Pupils: Pupils are equal, round, and reactive to light.  Neck:     Thyroid : No thyromegaly.     Vascular: No JVD.  Cardiovascular:     Rate and Rhythm: Normal rate and regular rhythm.     Heart sounds: Normal heart sounds. No murmur heard.    No friction rub. No gallop.  Pulmonary:     Effort: Pulmonary effort is normal. No respiratory distress.     Breath sounds: Normal breath sounds. No wheezing or rales.  Abdominal:     General: Bowel sounds are normal. There is no distension.     Palpations: Abdomen is soft. There is no mass.     Tenderness: There is no abdominal tenderness. There is no guarding or rebound.  Musculoskeletal:     Cervical back: Neck supple.     Right lower leg: No edema.     Left lower leg: No edema.  Lymphadenopathy:     Cervical: No cervical adenopathy.           Assessment & Plan:  Vasovagal syncope - Plan: CBC with Differential/Platelet, Comprehensive metabolic panel with GFR, Hemoglobin A1c Exam is normal.  I suspect vasovagal syncope secondary to Valsalva.  I will check a CBC to evaluate for leukocytosis.  I will check a CMP to monitor renal function.  Given the elevated sugar I will also check a hemoglobin A1c.  If labs are normal no further workup is  necessary.  Recommended MiraLAX  as a stool softener.

## 2024-07-30 ENCOUNTER — Ambulatory Visit: Payer: Self-pay | Admitting: Family Medicine

## 2024-07-30 LAB — CBC WITH DIFFERENTIAL/PLATELET
Absolute Lymphocytes: 1349 {cells}/uL (ref 850–3900)
Absolute Monocytes: 675 {cells}/uL (ref 200–950)
Basophils Absolute: 7 {cells}/uL (ref 0–200)
Basophils Relative: 0.1 %
Eosinophils Absolute: 178 {cells}/uL (ref 15–500)
Eosinophils Relative: 2.5 %
HCT: 39.5 % (ref 35.9–46.0)
Hemoglobin: 12.2 g/dL (ref 11.7–15.5)
MCH: 25.8 pg — ABNORMAL LOW (ref 27.0–33.0)
MCHC: 30.9 g/dL — ABNORMAL LOW (ref 31.6–35.4)
MCV: 83.7 fL (ref 81.4–101.7)
MPV: 11.4 fL (ref 7.5–12.5)
Monocytes Relative: 9.5 %
Neutro Abs: 4892 {cells}/uL (ref 1500–7800)
Neutrophils Relative %: 68.9 %
Platelets: 223 Thousand/uL (ref 140–400)
RBC: 4.72 Million/uL (ref 3.80–5.10)
RDW: 15.8 % — ABNORMAL HIGH (ref 11.0–15.0)
Total Lymphocyte: 19 %
WBC: 7.1 Thousand/uL (ref 3.8–10.8)

## 2024-07-30 LAB — COMPREHENSIVE METABOLIC PANEL WITH GFR
AG Ratio: 1.4 (calc) (ref 1.0–2.5)
ALT: 13 U/L (ref 6–29)
AST: 17 U/L (ref 10–35)
Albumin: 3.7 g/dL (ref 3.6–5.1)
Alkaline phosphatase (APISO): 73 U/L (ref 37–153)
BUN/Creatinine Ratio: 20 (calc) (ref 6–22)
BUN: 23 mg/dL (ref 7–25)
CO2: 27 mmol/L (ref 20–32)
Calcium: 8.9 mg/dL (ref 8.6–10.4)
Chloride: 106 mmol/L (ref 98–110)
Creat: 1.13 mg/dL — ABNORMAL HIGH (ref 0.60–0.95)
Globulin: 2.7 g/dL (ref 1.9–3.7)
Glucose, Bld: 149 mg/dL — ABNORMAL HIGH (ref 65–99)
Potassium: 5.3 mmol/L (ref 3.5–5.3)
Sodium: 137 mmol/L (ref 135–146)
Total Bilirubin: 0.3 mg/dL (ref 0.2–1.2)
Total Protein: 6.4 g/dL (ref 6.1–8.1)
eGFR: 48 mL/min/1.73m2 — ABNORMAL LOW

## 2024-07-30 LAB — HEMOGLOBIN A1C
Hgb A1c MFr Bld: 7.3 % — ABNORMAL HIGH
Mean Plasma Glucose: 163 mg/dL
eAG (mmol/L): 9 mmol/L

## 2024-08-16 ENCOUNTER — Telehealth: Payer: Self-pay

## 2024-08-16 NOTE — Telephone Encounter (Signed)
 Copied from CRM [id number]. Topic: Clinical - Medical Advice >> Aug 16, 2024  1:34 PM Rea ORN wrote: Reason for CRM: Pt daughter Lauri calling to ask if there is something else for pt's sundowners. She is not sleeping at night. Louanna said the LORazepam  (ATIVAN ) 0.5 MG tablet is not helping and she is asking can their be a dose increase or if she can get sleep medication.  Please call back to advise, [phone]

## 2024-08-17 ENCOUNTER — Other Ambulatory Visit: Payer: Self-pay | Admitting: Family Medicine

## 2024-08-17 ENCOUNTER — Telehealth: Payer: Self-pay

## 2024-08-17 MED ORDER — QUETIAPINE FUMARATE 50 MG PO TABS: 50.0000 mg | ORAL_TABLET | Freq: Every day | ORAL | 1 refills | Status: DC

## 2024-08-17 NOTE — Telephone Encounter (Signed)
 Copied from CRM [id number]. Topic: General - Other >> Aug 17, 2024  1:41 PM Tonda B wrote: Reason for CRM: patients daughter missed call from office from ronal bradley please call pt daughter back [phone]

## 2024-08-25 ENCOUNTER — Telehealth: Payer: Self-pay

## 2024-08-25 NOTE — Telephone Encounter (Signed)
 Copied from CRM [id number]. Topic: Referral - Request for Referral >> Aug 25, 2024  2:59 PM Tiffini S wrote: Did the patient discuss referral with their provider in the last year? No (If No - schedule appointment) (If Yes - send message)  Appointment offered? No Ophthalmology  Type of order/referral and detailed reason for visit:Ophthalmology for Ulcer in the right eye   Preference of office, provider, location: [facility] If referral order, have you been seen by this specialty before? Yes (If Yes, this issue or another issue? When? Where?  Can we respond through MyChart? No, please call Abryanna Musolino at [phone]  [facility] with [employer]

## 2024-08-26 ENCOUNTER — Other Ambulatory Visit: Payer: Self-pay | Admitting: Family Medicine

## 2024-08-26 DIAGNOSIS — H16001 Unspecified corneal ulcer, right eye: Secondary | ICD-10-CM

## 2024-08-27 ENCOUNTER — Telehealth: Payer: Self-pay

## 2024-08-27 NOTE — Telephone Encounter (Signed)
 Spoke with pt's daughter, Sema, on [place], who states the patient needs a referral to Dr. Dusty Spates or Dr. Carlin Sports at [facility]. Pt has an appointment in their office on 09/09/2024. Thank you.

## 2024-08-27 NOTE — Telephone Encounter (Signed)
 Copied from CRM [id number]. Topic: Referral - Question >> Aug 27, 2024  1:32 PM Deleta RAMAN wrote: Reason for CRM: patient referral was wrong patient was treated for the issue(alters) sent on referral and does not need treatment on that issue. She should only go to [facility] for her follow ups.Please contact patient daughter at [phone]

## 2024-09-09 ENCOUNTER — Other Ambulatory Visit: Payer: Self-pay | Admitting: Family Medicine
